# Patient Record
Sex: Female | Born: 1965 | Race: White | Hispanic: No | Marital: Married | Smoking: Former smoker
Health system: Southern US, Community
[De-identification: ages and names within clinical notes are randomized; demographics above are authoritative.]

## PROBLEM LIST (undated history)

## (undated) DIAGNOSIS — M25519 Pain in unspecified shoulder: Secondary | ICD-10-CM

## (undated) DIAGNOSIS — U071 COVID-19: Secondary | ICD-10-CM

## (undated) DIAGNOSIS — N809 Endometriosis, unspecified: Secondary | ICD-10-CM

## (undated) DIAGNOSIS — M5412 Radiculopathy, cervical region: Secondary | ICD-10-CM

## (undated) DIAGNOSIS — G43909 Migraine, unspecified, not intractable, without status migrainosus: Secondary | ICD-10-CM

## (undated) DIAGNOSIS — F419 Anxiety disorder, unspecified: Secondary | ICD-10-CM

## (undated) DIAGNOSIS — D649 Anemia, unspecified: Secondary | ICD-10-CM

## (undated) DIAGNOSIS — K589 Irritable bowel syndrome without diarrhea: Secondary | ICD-10-CM

## (undated) DIAGNOSIS — G629 Polyneuropathy, unspecified: Secondary | ICD-10-CM

## (undated) DIAGNOSIS — Z90710 Acquired absence of both cervix and uterus: Secondary | ICD-10-CM

## (undated) HISTORY — DX: Endometriosis, unspecified: N80.9

## (undated) HISTORY — DX: Pain in unspecified shoulder: M25.519

## (undated) HISTORY — DX: Migraine, unspecified, not intractable, without status migrainosus: G43.909

## (undated) HISTORY — PX: CHOLECYSTECTOMY: SHX55

## (undated) HISTORY — PX: NASAL SEPTUM SURGERY: SHX37

## (undated) HISTORY — PX: DIAGNOSTIC LAPAROSCOPY: SUR761

## (undated) HISTORY — DX: COVID-19: U07.1

## (undated) HISTORY — DX: Polyneuropathy, unspecified: G62.9

## (undated) HISTORY — PX: ABDOMINAL HYSTERECTOMY: SHX81

## (undated) HISTORY — DX: Anemia, unspecified: D64.9

## (undated) HISTORY — DX: Radiculopathy, cervical region: M54.12

---

## 1998-10-06 ENCOUNTER — Inpatient Hospital Stay (HOSPITAL_COMMUNITY): Admission: AD | Admit: 1998-10-06 | Discharge: 1998-10-08 | Payer: Self-pay | Admitting: Obstetrics and Gynecology

## 1998-10-11 ENCOUNTER — Encounter (HOSPITAL_COMMUNITY): Admission: RE | Admit: 1998-10-11 | Discharge: 1999-01-09 | Payer: Self-pay | Admitting: *Deleted

## 1998-11-19 ENCOUNTER — Other Ambulatory Visit: Admission: RE | Admit: 1998-11-19 | Discharge: 1998-11-19 | Payer: Self-pay | Admitting: Obstetrics and Gynecology

## 1999-01-10 ENCOUNTER — Encounter (HOSPITAL_COMMUNITY): Admission: RE | Admit: 1999-01-10 | Discharge: 1999-04-10 | Payer: Self-pay | Admitting: Obstetrics & Gynecology

## 1999-12-23 ENCOUNTER — Other Ambulatory Visit: Admission: RE | Admit: 1999-12-23 | Discharge: 1999-12-23 | Payer: Self-pay | Admitting: Obstetrics and Gynecology

## 2001-01-03 ENCOUNTER — Other Ambulatory Visit: Admission: RE | Admit: 2001-01-03 | Discharge: 2001-01-03 | Payer: Self-pay | Admitting: Obstetrics and Gynecology

## 2002-04-04 ENCOUNTER — Other Ambulatory Visit: Admission: RE | Admit: 2002-04-04 | Discharge: 2002-04-04 | Payer: Self-pay | Admitting: Obstetrics and Gynecology

## 2003-07-30 ENCOUNTER — Other Ambulatory Visit: Admission: RE | Admit: 2003-07-30 | Discharge: 2003-07-30 | Payer: Self-pay | Admitting: Obstetrics and Gynecology

## 2003-11-16 ENCOUNTER — Ambulatory Visit (HOSPITAL_COMMUNITY): Admission: RE | Admit: 2003-11-16 | Discharge: 2003-11-16 | Payer: Self-pay | Admitting: Obstetrics and Gynecology

## 2003-11-16 ENCOUNTER — Encounter (INDEPENDENT_AMBULATORY_CARE_PROVIDER_SITE_OTHER): Payer: Self-pay | Admitting: Specialist

## 2005-02-24 ENCOUNTER — Other Ambulatory Visit: Admission: RE | Admit: 2005-02-24 | Discharge: 2005-02-24 | Payer: Self-pay | Admitting: Obstetrics and Gynecology

## 2006-05-13 ENCOUNTER — Encounter: Admission: RE | Admit: 2006-05-13 | Discharge: 2006-05-13 | Payer: Self-pay | Admitting: Family Medicine

## 2007-08-31 ENCOUNTER — Encounter (INDEPENDENT_AMBULATORY_CARE_PROVIDER_SITE_OTHER): Payer: Self-pay | Admitting: Obstetrics and Gynecology

## 2007-08-31 ENCOUNTER — Ambulatory Visit (HOSPITAL_COMMUNITY): Admission: RE | Admit: 2007-08-31 | Discharge: 2007-09-01 | Payer: Self-pay | Admitting: Obstetrics and Gynecology

## 2009-05-16 ENCOUNTER — Encounter: Admission: RE | Admit: 2009-05-16 | Discharge: 2009-05-16 | Payer: Self-pay | Admitting: Obstetrics and Gynecology

## 2010-07-30 ENCOUNTER — Other Ambulatory Visit: Payer: Self-pay | Admitting: Family Medicine

## 2010-07-30 DIAGNOSIS — M542 Cervicalgia: Secondary | ICD-10-CM

## 2010-07-31 ENCOUNTER — Other Ambulatory Visit: Payer: Self-pay | Admitting: Family Medicine

## 2010-07-31 DIAGNOSIS — M542 Cervicalgia: Secondary | ICD-10-CM

## 2010-08-01 ENCOUNTER — Ambulatory Visit
Admission: RE | Admit: 2010-08-01 | Discharge: 2010-08-01 | Disposition: A | Discharge status: Home / Self Care | Payer: BC Managed Care – PPO | Source: Ambulatory Visit | Attending: Sports Medicine | Admitting: Sports Medicine

## 2010-08-01 ENCOUNTER — Other Ambulatory Visit: Payer: Self-pay | Admitting: Family Medicine

## 2010-08-01 DIAGNOSIS — M542 Cervicalgia: Secondary | ICD-10-CM

## 2010-09-10 ENCOUNTER — Other Ambulatory Visit: Payer: Self-pay | Admitting: Neurology

## 2010-09-10 DIAGNOSIS — G43909 Migraine, unspecified, not intractable, without status migrainosus: Secondary | ICD-10-CM

## 2010-09-10 DIAGNOSIS — M79609 Pain in unspecified limb: Secondary | ICD-10-CM

## 2010-09-10 DIAGNOSIS — M5412 Radiculopathy, cervical region: Secondary | ICD-10-CM

## 2010-09-10 DIAGNOSIS — M25519 Pain in unspecified shoulder: Secondary | ICD-10-CM

## 2010-09-16 ENCOUNTER — Ambulatory Visit
Admission: RE | Admit: 2010-09-16 | Discharge: 2010-09-16 | Disposition: A | Discharge status: Home / Self Care | Payer: BC Managed Care – PPO | Source: Ambulatory Visit | Attending: Neurology | Admitting: Neurology

## 2010-09-16 DIAGNOSIS — M5412 Radiculopathy, cervical region: Secondary | ICD-10-CM

## 2010-09-16 DIAGNOSIS — M79609 Pain in unspecified limb: Secondary | ICD-10-CM

## 2010-09-16 DIAGNOSIS — G43909 Migraine, unspecified, not intractable, without status migrainosus: Secondary | ICD-10-CM

## 2010-09-16 DIAGNOSIS — M25519 Pain in unspecified shoulder: Secondary | ICD-10-CM

## 2010-09-22 ENCOUNTER — Other Ambulatory Visit: Payer: Self-pay | Admitting: Neurology

## 2010-09-22 DIAGNOSIS — M79609 Pain in unspecified limb: Secondary | ICD-10-CM

## 2010-09-29 ENCOUNTER — Ambulatory Visit
Admission: RE | Admit: 2010-09-29 | Discharge: 2010-09-29 | Disposition: A | Discharge status: Home / Self Care | Payer: BC Managed Care – PPO | Source: Ambulatory Visit | Attending: Neurology | Admitting: Neurology

## 2010-09-29 DIAGNOSIS — M79609 Pain in unspecified limb: Secondary | ICD-10-CM

## 2010-11-11 NOTE — Discharge Summary (Signed)
[name], BURTT NO.:  [id number]   MEDICAL RECORD NO.:  [id number]          PATIENT TYPE:  OIB   LOCATION:  9316                          FACILITY:  [facility]   PHYSICIAN:  Juluis Mire, M.D.   DATE OF BIRTH:  July 12, 1965   DATE OF ADMISSION:  08/31/2007  DATE OF DISCHARGE:  09/01/2007                               DISCHARGE SUMMARY   ADMITTING DIAGNOSIS:  Pelvic pain secondary to uterine adenomyosis.   DISCHARGE DIAGNOSIS:  Pelvic pain secondary to uterine adenomyosis.   OPERATIVE PROCEDURE:  Laparoscopic-assisted vaginal hysterectomy.   For complete history and physical, please see dictated note.   COURSE IN THE HOSPITAL:  The patient underwent the above-noted surgery.  Pathology is still pending.  Postop did well.  Was discharged home on  her first postop day.  At that time she was afebrile.  She was  tolerating oral intake.  Her Foley had been discontinued.  She was  voiding without difficulty.  She had minimal vaginal spotting.  All  incisions were intact.  Her abdomen was soft and bowel sounds were  active.  Postop hemoglobin was 12.   In terms of complication, none were encountered during her stay in the  hospital.  The patient is discharged home in stable condition.   DISPOSITION:  Routine postop instructions were given.  She is to avoid  heavy lifting, vaginal entrance or driving a car.  She is to watch for  signs of infection, nausea, vomiting, increasing abdominal pain or  active vaginal bleeding.  Also instructed in signs and symptoms of deep  venous thrombosis and pulmonary embolus.  She is discharged home on  Tylox as needed for pain.   FOLLOWUP:  Plan followup in 1 week in the office.      Juluis Mire, M.D.  Electronically Signed     JSM/MEDQ  D:  09/01/2007  T:  09/01/2007  Job:  [id number]

## 2010-11-11 NOTE — H&P (Signed)
NAME:  [name], [name] NO.:  [id number]   MEDICAL RECORD NO.:  [id number]         PATIENT TYPE:  WOIB   LOCATION:                                [facility]   PHYSICIAN:  Juluis Mire, M.D.   DATE OF BIRTH:  1965-11-07   DATE OF ADMISSION:  08/31/2007  DATE OF DISCHARGE:  09/01/2007                              HISTORY & PHYSICAL   The patient is a 45 year old gravida III, para 2, abortus 1 female who  presents for laparoscopic-assisted vaginal hysterectomy.   __________ present admission.  Back in 2005, the patient underwent  laparoscopic bilateral tubal ligation, hysteroscopy and NovaSure  ablation.  She had been doing extremely well.  She was basically  amenorrheic, but she began over the past 6 months having discomfort 2  weeks of every month.  This was a severe pain that would radiate down  the legs and back.  Over-the-counter agents were not helping.  We did a  saline infusion ultrasound.  There is minimal endometrium.  She have a 2  cm cyst but it looked to be hemorrhagic in nature.  We felt that this  probably adenomyosis leading to this symptomatology.  After discussing  the options including the use of suppressive agents such as Depo-Provera  birth control pills, the patient decided to proceed with laparoscopic-  assisted vaginal hysterectomy.   ALLERGIES:  NO KNOWN DRUG ALLERGIES.   MEDICATIONS:  Vitamins.   PAST MEDICAL HISTORY:  Usual childhood diseases.  No significant  sequelae.   PAST SURGICAL HISTORY:  In 1989, she had an ovarian cystectomy with the  finding of a simple right ovarian cyst.  She had a repeat laparoscopy in  1989 with lysis of adhesions.  In 1972, she had a laparoscopy with the  finding of endometriosis.  She had another laparoscopy in 1994 with  recurrent endometriosis.  And then she had the above-noted surgery.   OBSTETRICAL HISTORY:  She has had one abortion and two vaginal  deliveries.   FAMILY HISTORY:   Noncontributory.   SOCIAL HISTORY:  No tobacco or alcohol use.   REVIEW OF SYSTEMS:  Noncontributory.   PHYSICAL EXAMINATION:  VITAL SIGNS:  The patient is afebrile with stable  vital signs.  HEENT:  The patient is normocephalic.  Pupils equal, round, reactive to  light and accommodation.  Extraocular movements were intact.  Sclerae  and conjunctivae clear.  Oropharynx clear.  NECK:  Without thyromegaly.  BREASTS:  No discrete masses.  LUNGS:  Clear.  CARDIOVASCULAR:  Regular rate.  No murmurs or gallops.  ABDOMEN:  Benign.  No mass, organomegaly or tenderness.  PELVIC:  Normal external genitalia.  Vaginal mucosa is clear.  Cervix  unremarkable.  Uterus upper limits of normal size.  Adnexa unremarkable.  EXTREMITIES:  Trace edema.  NEUROLOGIC:  Grossly within normal limits.   IMPRESSION:  Increasing pelvic pain and discomfort, possibly secondary  to adenomyosis, unresponsive to conservative management.   PLAN:  The patient to undergo laparoscopic-assisted vaginal  hysterectomy.  Again, alternatives were discussed, the risks of surgery  explained including the risk of  infection.  The risk of hemorrhage could  require transfusion with the risk of AIDS or hepatitis.  Risk of injury  to adjacent organs including bladder, bowel or ureters that could  require further exploratory surgery.  Risk of deep venous thrombosis and  pulmonary embolus.  The patient expressed understanding of indications,  risks and other alternatives.      Juluis Mire, M.D.  Electronically Signed     JSM/MEDQ  D:  08/31/2007  T:  08/31/2007  Job:  [id number]

## 2010-11-11 NOTE — Op Note (Signed)
NAMELADONNA, [name] NO.:  [id number]   MEDICAL RECORD NO.:  [id number]          PATIENT TYPE:  OIB   LOCATION:  9316                          [facility]ACILITY:  [facility]   PHYSICIAN:  Juluis Mire, M.D.   DATE OF BIRTH:  1965-12-14   DATE OF PROCEDURE:  08/31/2007  DATE OF DISCHARGE:                               OPERATIVE REPORT   PREOPERATIVE DIAGNOSIS:  Pelvic pain secondary to adenomyosis.   POSTOPERATIVE DIAGNOSIS:  Pelvic pain secondary to adenomyosis.   OPERATIVE PROCEDURE:  Laparoscopic assisted vaginal hysterectomy.   SURGEON:  Juluis Mire, M.D.   ANESTHESIA:  General endotracheal anesthesia.   ESTIMATED BLOOD LOSS:  300-400 mL.   PACKS AND DRAINS:  None.   INTRAOPERATIVE BLOOD REPLACEMENT:  None.   COMPLICATIONS:  None.   INDICATIONS:  Are as dictated in the history and physical.   PROCEDURE:  The patient was taken to the OR and placed in a supine  position.  After a satisfactory level of general endotracheal anesthesia  was obtained, the patient was placed in the dorsal lithotomy position  using Allen stirrups.  The abdomen, perineum, and vagina were prepped  out with Betadine.  The bladder was emptied by in-and-out  catheterization.  A Hulka tenaculum was put in place and secured.  The  patient was draped in a sterile field.  A subumbilical incision was made  with the knife and carried through the subcutaneous tissue.  The fascia  was identified and entered sharply.  The muscles were separated and the  peritoneum was entered sharply.  Digital exploration revealed no  adhesions in the periumbilical area and open laparoscopic trocar was put  in place and secured.  The laparoscope was introduced.  A 5 mm trocar  was put in place in the suprapubic area under direct visualization.  Visualization of the uterus revealed it to be upper limits of normal  size.  The tubes and ovaries were unremarkable.  The appendix was  visualized and noted to be normal.   The gallbladder was surgically  absent.  Both lateral gutters were clear.  Using the gyrus bipolar, the  right utero-ovarian pedicle was cauterized and incised and the right  round ligament was cauterized and incised.  We then, using the gyrus,  cauterized and incised the left utero-ovarian pedicle and left round  ligament.  We thus freed the uterus up from the adnexa.   The abdomen was desufflated of its carbon dioxide.  All trocars were  removed.  The patient's legs were repositioned.  The Hulka tenaculum was  then taken out.  A weighted speculum was placed in the vaginal vault.  The cervix was grasped with a Christella Hartigan tenaculum.  The cul-de-sac was  entered sharply.  Both uterosacral ligaments were clamped, cut and  suture ligated with 0 Vicryl.  The reflection of the vaginal mucosa  anteriorly was incised and the bladder was dissected superiorly.  The  paracervical tissue was clamped, cut, and suture ligated with 0 Vicryl.  The vesicouterine space was identified, entered sharply, and retractors  put in place. Using the clamp, cut  and tie technique with suture  ligature of 0 Vicryl, the parametrium was serially separated from the  sides of the uterus.  The uterus was then flipped, remaining pedicles  were clamped and cut.  The cervix and uterus were passed off the  operative field and sent to pathology.  The pedicles were secured with  free ties of 0 Vicryl.  A uterosacral plication stitch of 0 Vicryl was  put in place.  The vaginal mucosa was closed in a vertical fashion with  interrupted figure-of-eights of 0 Monocryl.  The Foley was placed to  straight drain with retrieval of an adequate amount of clear urine.   The patient's legs were repositioned, the laparoscope was reintroduced.  We irrigated the pelvis.  Both ovaries were hemostatically intact.  There was some oozing from the vaginal cuff that was brought under  control using the gyrus. We then reinflated the abdomen and no  further  bleeding was encountered.  There was no evidence of injury to adjacent  organs.  The abdomen was desufflated of carbon dioxide and all trocars  removed.  The subumbilical fascia was closed with two figure-of-eights  of 0 Vicryl and the skin with subcuticular 4-0 Vicryl.  The suprapubic  incision was closed with Dermabond.  Sponge, instrument, and needle  counts were reported as correct by the circulating nurse x2.  The  patient was taken out of the dorsal lithotomy position, once alert and  extubated, was transferred to the recovery room good condition.      Juluis Mire, M.D.  Electronically Signed     JSM/MEDQ  D:  08/31/2007  T:  08/31/2007  Job:  [id number]

## 2010-11-14 NOTE — Op Note (Signed)
NAME:  [name], [name]                        ACCOUNT NO.:  [id number]   MEDICAL RECORD NO.:  [id number]                   PATIENT TYPE:  AMB   LOCATION:  SDC                                  [facility]ACILITY:  [facility]   PHYSICIAN:  Juluis Mire, M.D.                DATE OF BIRTH:  [birth year]   DATE OF PROCEDURE:  11/16/2003  DATE OF DISCHARGE:                                 OPERATIVE REPORT   PREOPERATIVE DIAGNOSES:  Multiparity desires sterility.  Menorrhagia.   POSTOPERATIVE DIAGNOSES:  Multiparity desires sterility.  Menorrhagia.   PROCEDURE:  Open laparoscopy, bilateral tubal fulguration, cervical  dilation.  Hysteroscopy, intrauterine curettage. Endometrial ablation using  the Novasure.   SURGEON:  Juluis Mire, M.D.   ANESTHESIA:  General endotracheal.   ESTIMATED BLOOD LOSS:  Minimal.   PACKS AND DRAINS:  None.   INTRAOPERATIVE BLOOD REPLACED:  None.   COMPLICATIONS:  None.   INDICATIONS FOR PROCEDURE:  As dictated in the history and physical.   DESCRIPTION OF PROCEDURE:  The patient was taken to the OR, placed in the  supine position. After a satisfactory level of general endotracheal  anesthesia was obtained, the patient was placed in the dorsal lithotomy  position using the Allen stirrups. The abdomen, perineum and vagina were  prepped out with Betadine. The bladder was __________ catheterization. A  Hulka tenaculum was then put in place and secured.  The patient was draped  as a sterile field.  A subumbilical incision was made with a knife and  carried through the subcutaneous tissue. The fascia was identified, entered  sharply, incision in the fascia extended laterally. The perineum was entered  with digital pressure. There was no periumbilical adhesions.  Open  laparoscopic trocar was put in place.  The abdomen was insufflated with  carbon dioxide. The laparoscope was introduced. There is no evidence of  injury to adjacent organs. The appendix appeared to be  retrocecal, upper  abdomen between the liver and the tip of the gallbladder were clear.  The  uterus was upper limits of normal size, tubes and ovaries were unremarkable.  No active endometriosis was noted. Using the bipolar, a 2 cm to 3 cm segment  of each tube was coagulated until resistant read zero on the meter. The same  segment of tube was then recoagulated completely desiccating each tube.  Coagulation did extend out the mesosalpinx. Both tubes were adequately  coagulated. The abdomen was deflated of carbon dioxide, laparoscope and  trocar was removed. The fascia was closed with two figure-of-eights of #0  Vicryl, skin with interrupted subcuticular of 4-0 Vicryl.   The patient's legs were repositioned.  The Hulka tenaculum was then removed.  A speculum was placed in the vaginal vault, the cervix grasped with single  tooth tenaculum.  The uterus sounded to approximately 9 cm.  Endocervical  length was approximately 4 cm. The cervix was serially dilated. The  nonoperative hysteroscope was then introduced, intrauterine cavity distended  using sorbitol. Visualization revealed a clean endometrium, no polyps or  other abnormalities were noted. Endometrial curettings were obtained and  sent for pathological review. At this point in time, the Novasure was  inserted.  It was properly positioned. CO2 test was negative. We  subsequently proceed with the ablation which lasted 1 minute and 10 seconds  to a power of 113.  The cavity depth was measured at 5 cm, the cavity width  at 4.1 cm.  After the ablation was done, the Novasure was removed. There was  some bleeding from the cervix at the area of tack and brought under control  with a figure-of-eight of #0 chromic. The speculum was then removed and the  patient taken out of the dorsal lithotomy position and once alert and  extubated transferred to the recovery room in good condition. Sponge,  instrument and needle count correct by circulating  nurse x2.                                               Juluis Mire, M.D.    JSM/MEDQ  D:  11/16/2003  T:  11/16/2003  Job:  [id number]

## 2010-11-14 NOTE — H&P (Signed)
NAME:  [name], [name]                        ACCOUNT NO.:  [id number]   MEDICAL RECORD NO.:  [id number]                   PATIENT TYPE:  AMB   LOCATION:  SDC                                  [facility]   PHYSICIAN:  Juluis Mire, M.D.                DATE OF BIRTH:  1965/07/10   DATE OF ADMISSION:  11/16/2003  DATE OF DISCHARGE:                                HISTORY & PHYSICAL   CHIEF COMPLAINT:  The patient is a 45 year old gravida 3 para 2 abortus 1  married white female who presents for laparoscopic bilateral tubal ligation,  hysteroscopy, and subsequent NovaSure ablation.   PRESENT ADMISSION:  The patient presently using condoms for birth control  purposes.  Cycles have been relatively regular.  She is experiencing a  significant increase in flow.  She reports 6 days of total flow, 5 days  being heavy where she changes pads and tampons every 2 hours with associated  clots.  She has gotten worse since she has discontinued birth control pills.  She is having some mid cycle pain and some increasing dysmenorrhea.  She  does have a past history of pelvic endometriosis that had been treated with  laparoscopic laser ablation.  She has also had some uterine enlargement  thought to be secondary to uterine adenomyosis.  She is sexually active with  no discomfort.  We had discussed the various options including going back on  birth control pills versus the above-noted procedure versus more aggressive  management with hysterectomy.  At the present time our main indication is  NovaSure ablation.  Because of concerns with future pregnancies we are going  to proceed with a laparoscopic bilateral tubal fulguration.  The potential  irreversibility of sterilization is explained.  A failure rate of 1 in 200  is quoted.  Failures can be in the form of ectopic pregnancy requiring  further surgical options.  She understands this and wishes to proceed with  tubal.  We will also do a  hysteroscopic evaluation and sampling of the  endometrium prior to the ablation.   ALLERGIES:  No known drug allergies.   MEDICATIONS:  Vitamins.   PAST MEDICAL HISTORY:  Usual childhood diseases, no significant sequelae.   PAST SURGICAL HISTORY:  She had an ovarian cystectomy in 1989 with the  finding of a simple right ovarian cyst.  Subsequently, due to pain she had a  repeat laparoscopy in 1989 with some lysis of adhesions.  Because of further  pain issues she had a repeat laparoscopy done in 1992.  We did find some  recurrent endometriosis at that time that was treated.  Again, because of  some recurrent pain issues she underwent a laparoscopy in 1994 and did have  some endometriosis that was treated at that time.   OBSTETRICAL HISTORY:  She has had one abortion and two vaginal deliveries.   FAMILY HISTORY:  Noncontributory.  SOCIAL HISTORY:  No tobacco or alcohol use.   REVIEW OF SYSTEMS:  Noncontributory.   PHYSICAL EXAMINATION:  VITAL SIGNS:  The patient is afebrile with stable  vital signs.  HEENT:  The patient is normocephalic.  Pupils equal, round, and reactive to  light and accommodation.  Extraocular movements were intact.  Sclerae and  conjunctivae were clear, oropharynx clear.  NECK:  Without thyromegaly.  BREASTS:  No discrete masses.  LUNGS:  Clear.  CARDIOVASCULAR:  Regular rhythm and rate without murmurs or gallops.  ABDOMEN:  Benign.  There are no masses, organomegaly, or tenderness.  PELVIC:  Normal external genitalia, vaginal mucosa is clear.  Cervix  unremarkable.  Uterus normal size, shape, and contour.  Adnexa free of  masses or tenderness.  EXTREMITIES:  Trace edema.  NEUROLOGIC:  Grossly within normal limits.   BASIC IMPRESSION:  1. Menorrhagia.  2. History of pelvic endometriosis.  3. Previous right ovarian cystectomy.  4. Multiparity, desires sterility.   PLAN:  The patient to undergo laparoscopic bilateral tubal fulguration  followed by  hysteroscopy and NovaSure ablation.  Success rates for ablation  of 80-90% have been discussed.  The risks of surgery explained including the  risk of infection; the risk of vascular injury that could lead to hemorrhage  requiring transfusion or possible hysterectomy; the risk of injury to  adjacent organs including bladder, bowel, or ureters that could require  further exploratory surgery; the risk of deep venous thrombosis and  pulmonary embolus.  The patient professed an understanding of the  indications and risks.                                               Juluis Mire, M.D.    JSM/MEDQ  D:  11/16/2003  T:  11/16/2003  Job:  [id number]

## 2011-01-14 ENCOUNTER — Ambulatory Visit
Admission: RE | Admit: 2011-01-14 | Discharge: 2011-01-14 | Disposition: A | Discharge status: Home / Self Care | Payer: BC Managed Care – PPO | Source: Ambulatory Visit | Attending: Family Medicine | Admitting: Family Medicine

## 2011-01-14 ENCOUNTER — Other Ambulatory Visit: Payer: Self-pay | Admitting: Family Medicine

## 2011-01-14 DIAGNOSIS — J189 Pneumonia, unspecified organism: Secondary | ICD-10-CM

## 2011-03-23 LAB — CBC
HCT: 34.1 — ABNORMAL LOW
HCT: 41.5
Hemoglobin: 12
Hemoglobin: 14.5
MCHC: 35
MCHC: 35.1
MCV: 94.7
MCV: 96
Platelets: 180
Platelets: 244
RBC: 3.55 — ABNORMAL LOW
RBC: 4.38
RDW: 12.4
RDW: 12.4
WBC: 3.3 — ABNORMAL LOW
WBC: 7.4

## 2011-03-23 LAB — HCG, SERUM, QUALITATIVE: Preg, Serum: NEGATIVE

## 2011-05-04 ENCOUNTER — Ambulatory Visit: Payer: BC Managed Care – PPO | Admitting: Family Medicine

## 2011-05-06 ENCOUNTER — Encounter: Payer: Self-pay | Admitting: Family Medicine

## 2011-05-06 ENCOUNTER — Ambulatory Visit
Admission: RE | Admit: 2011-05-06 | Discharge: 2011-05-06 | Disposition: A | Discharge status: Home / Self Care | Payer: BC Managed Care – PPO | Source: Ambulatory Visit | Attending: Sports Medicine | Admitting: Sports Medicine

## 2011-05-06 ENCOUNTER — Ambulatory Visit (INDEPENDENT_AMBULATORY_CARE_PROVIDER_SITE_OTHER): Payer: BC Managed Care – PPO | Admitting: Family Medicine

## 2011-05-06 VITALS — BP 123/77 | HR 85 | Ht 69.0 in | Wt 143.0 lb

## 2011-05-06 DIAGNOSIS — M79671 Pain in right foot: Secondary | ICD-10-CM

## 2011-05-06 DIAGNOSIS — M79609 Pain in unspecified limb: Secondary | ICD-10-CM

## 2011-05-06 NOTE — Assessment & Plan Note (Signed)
DDX is metatarsal stress fracture vs metatarsalgia.  No clear sign of Fx on [place] today. Plan for Xray as injury is now 33-46 weeks old.  Will follow Xray results. Have placed patient in post op shoe today and made orthotics/sports insoles with metatarsal pad.  Will follow up depending on Dx.

## 2011-05-06 NOTE — Progress Notes (Signed)
Right foot pain: Starting 2 months ago following a jumping aerobics type workout. She had BL pain along her MTPs following the workout. She tried to rest her feet and took two weeks off from exercise. Her left foot pain resolved. However her right foot pain has persisted. Currently she has pain at the 3rd MTP distal head with walking or any jumping. No numbness in her toes. She notes chronic nagging pain. No fevers or chills.   Exam:  BP 123/77  Pulse 85  Ht 5\' 9"  (1.753 m)  Wt 143 lb (64.864 kg)  BMI 21.12 kg/m2 Gen: Well NAD MSK: Foot toes splay when standing. Normal longitudinal arch. Toes are NVI. 3rd MTP head is TTP.  Pt has pain in 3rd MTP when compressing the transverse arch.  Normal motion of the toes.   [place]: Foot US shows some neo-vessle formation along the dorsal distal 3rd metatarsal. No clear cortex disruption.

## 2011-05-06 NOTE — Patient Instructions (Signed)
Thank you for coming in today. We will call you with the results of your xray tomorrow.  Take it easy today and this week.  Cycling and swimming are OK.  Wear the post op shoe.  We will discuss return to clinic over the phone with the xray results.

## 2011-05-07 ENCOUNTER — Telehealth: Payer: Self-pay | Admitting: Family Medicine

## 2011-05-07 NOTE — Telephone Encounter (Signed)
Discussed results of her xray.  There was no sign of a stress fracture on her film.  She still has pain.  We will keep her in her boot for at least 2 weeks.  After 2 weeks if she doesn't have pain she can go back into her normal shoe.  If the pain continues she will keep the boot until her follow up with [place] in one month.

## 2011-06-15 ENCOUNTER — Ambulatory Visit (INDEPENDENT_AMBULATORY_CARE_PROVIDER_SITE_OTHER): Payer: BC Managed Care – PPO | Admitting: Sports Medicine

## 2011-06-15 VITALS — BP 110/60

## 2011-06-15 DIAGNOSIS — M79671 Pain in right foot: Secondary | ICD-10-CM

## 2011-06-15 DIAGNOSIS — M79609 Pain in unspecified limb: Secondary | ICD-10-CM

## 2011-06-15 NOTE — Progress Notes (Signed)
  Subjective:    Patient ID: [name], female    DOB: [birth year], 45 y.o.   MRN: [id number]  HPI 45 y/o female is here to follow up for right foot pain that was thought to be secondary to [employer]. She had a stress reaction seen on ultrasound at her last visit so was placed in a hard sole shoe.  She wore the post op shoe for 2 weeks and then started wearing the green insoles with metatarsal pad in her everyday shoes.  She continues to have pain.  She has most of the pain at the end of the day.  She gets pain and burning around the MTP joints of the 2nd-4th toes.  She never gets numbness or burning of the toes.  There is no more pain in the foot.  Also on the right side, she has sciatica which is intermittently symptomatic.  She is not currently symptomatic.  When she gets a flare she generally treats this with gabapentin at night.   Review of Systems     Objective:   Physical Exam  No tenderness to palpation over the metatarsals  Tenderness to palpation of the interdigital spaces on the plantar surface Mild swelling of the forefoot Normal motion of the ankle and toes Normal sensation of the toes Loss of the transverse arch with subluxation of the 5th toes Normal posterior tibialis function  Negative straight leg raise ankle extended Does get calf pain with flexion of the foot FABER is neutral on the left, 70 degrees on the right with some pain  Hip flexion to 100 degrees bilaterally without pain        Assessment & Plan:

## 2011-06-15 NOTE — Patient Instructions (Addendum)
1. Your foot pain is from bruising of the nerves in your foot.  You should start taking your gabapentin again to help with the pain.  2. You should start wearing your orthotics in your shoes daily.  3. Follow up in 6 weeks

## 2011-06-15 NOTE — Assessment & Plan Note (Signed)
DDx for this pain is metatarsalgia, morton's neuroma, and injury to the neurovascular bundle at the interdigital spaces.  The treatment for all is similar involving support of the transverse arch.  We added a metatarsal bar to achieve this.  As her pain is c/w nerve pain she will start taking her gabapentin again at night.  Suspect that her history of sciatica would make her distal nerves more sensitive in general so the gabapentin should be helpful for her pain.

## 2011-07-07 ENCOUNTER — Encounter: Payer: Self-pay | Admitting: General Practice

## 2011-08-05 ENCOUNTER — Ambulatory Visit (INDEPENDENT_AMBULATORY_CARE_PROVIDER_SITE_OTHER): Payer: BC Managed Care – PPO | Admitting: Internal Medicine

## 2011-08-05 ENCOUNTER — Encounter: Payer: Self-pay | Admitting: Internal Medicine

## 2011-08-05 VITALS — BP 108/68 | HR 78 | Ht 69.0 in | Wt 149.8 lb

## 2011-08-05 DIAGNOSIS — R1033 Periumbilical pain: Secondary | ICD-10-CM

## 2011-08-05 DIAGNOSIS — R109 Unspecified abdominal pain: Secondary | ICD-10-CM

## 2011-08-05 MED ORDER — ALPRAZOLAM 0.5 MG PO TABS: ORAL_TABLET | ORAL | Status: DC

## 2011-08-05 MED ORDER — HYOSCYAMINE SULFATE 0.125 MG SL SUBL: SUBLINGUAL_TABLET | SUBLINGUAL | Status: DC

## 2011-08-05 MED ORDER — BELLADONNA ALK-PHENOBARBITAL 48 MG PO TBCR: EXTENDED_RELEASE_TABLET | ORAL | Status: DC

## 2011-08-05 MED ORDER — ALIGN 4 MG PO CAPS: 1.0000 | ORAL_CAPSULE | ORAL | Status: DC

## 2011-08-05 NOTE — Patient Instructions (Addendum)
You have been scheduled for an upper gi series with small bowel follow thru at [facility] on 08/10/11 @ 9:30 am. Please arrive at 9:15 am for registration. Please make certain not to have anything to eat or drink after midnight on the night before your test. If this appointment time and date does not work well for you, you may call radiology directly at [phone]. We have given you samples of Align. This puts good bacteria back into your intestines. You should take 1 capsule by mouth once daily. If this works well for you, it can be purchased over the counter. We have sent the following medications to your pharmacy for you to pick up at your convenience: Donnatal Levsin Xanax CC: Dr Ancil Boozer, Dr Arelia Sneddon

## 2011-08-05 NOTE — Progress Notes (Signed)
MYSTIC LABO 1966-05-29 MRN [id number]   History of Present Illness:  This is a 46 year old female with severe crampy abdominal pain and gas which occurs frequently and may be very severe to the point where she has to get on her knees. She has tried an elimitation diet without success. She has seen Dr. Kinnie Scales and was tried on Amitiza 8 mcg twice a day. She has been on Donnatal one by mouth twice a day for the past 2 years without improvement. She tends to have constipation for which she takes magnesium tablets. She used to take Xanax 0.5 mg when necessary for stress. She has migraine headaches for which she takes Topamax 150 mg/day. She also has neuropathy for which she takes Neurontin 300 mg at bedtime. She denies rectal bleeding, family history of Crohn's disease or ulcerative colitis. Her weight has been stable. She has a history of endometriosis necessitating a laparoscopic ablation by Dr. Arelia Sneddon.   Past Medical History  Diagnosis Date  . Migraines    Past Surgical History  Procedure Date  . Abdominal hysterectomy   . Cholecystectomy     reports that she has never smoked. She has never used smokeless tobacco. She reports that she does not drink alcohol or use illicit drugs. family history includes Colon polyps in her mother; Diabetes in her mother; Hypertension in her mother; and Thyroid disease in her father. Allergies  Allergen Reactions  . Codeine     nausea        Review of Systems: Denies dysphagia, odynophagia, chest pain shortness of breath  The remainder of the 10 point ROS is negative except as outlined in H&P   Physical Exam: General appearance  Well developed, in no distress. Eyes- non icteric. HEENT nontraumatic, normocephalic. Mouth no lesions, tongue papillated, no cheilosis. Neck supple without adenopathy, thyroid not enlarged, no carotid bruits, no JVD. Lungs Clear to auscultation bilaterally. Cor normal S1, normal S2, regular rhythm, no murmur,  quiet  precordium. Abdomen soft scaphoid with good muscular support. Normal active bowel sounds. No particular tenderness. No bruit.  Rectal: Soft Hemoccult negative stool Extremities no pedal edema. Skin no lesions. Neurological alert and oriented x 3. Psychological normal mood and affect.  Assessment and Plan:  Problem #1 Crampy abdominal pain consistent with irritable bowel syndrome. We need to rule out the possibility of active endometriosis with implants on the bowel. We also need to rule out primary small bowel disease such as Crohn's or small bowel malrotation causing partial obstruction. I have asked her to taper off her Donnatal which does not seem to be helping and start Levsin sublingual 0.125 mg when necessary for abdominal pain. If there is no relief, she will try Xanax 0.5 mg as a smooth muscle relaxant. We will add a probiotic once a day. We may also consider a colonoscopy. She is seeing Dr.McComb to evaluate for recurrent endometriosis which could be the source of some of her abdominal pain. She has a pelvic ultrasound scheduled.   08/05/2011 [name]

## 2011-08-06 ENCOUNTER — Telehealth: Payer: Self-pay | Admitting: Internal Medicine

## 2011-08-06 NOTE — Telephone Encounter (Signed)
I would switch her to regular Donnatal 1x/day for 2 weeks, then stop .

## 2011-08-06 NOTE — Telephone Encounter (Signed)
Dr Juanda Chance- The extended release donnatal tabs are no longer available. Do you want me to change patient to regular donnatal tablet? If so, do you want her to have it for more than once daily as pharmacy recommends be done with the regular tablets (I know you are trying to taper her off)?

## 2011-08-07 NOTE — Telephone Encounter (Signed)
I have spoken to [place] @ [facility] to advise that regular donnatal is okay. She would like patient to have only 1 tablet daily x 2 weeks, then discontinue. Jim verbalizes understanding.

## 2011-08-10 ENCOUNTER — Ambulatory Visit (HOSPITAL_COMMUNITY)
Admission: RE | Admit: 2011-08-10 | Discharge: 2011-08-10 | Disposition: A | Discharge status: Home / Self Care | Payer: BC Managed Care – PPO | Source: Ambulatory Visit | Attending: Internal Medicine | Admitting: Internal Medicine

## 2011-08-10 DIAGNOSIS — R109 Unspecified abdominal pain: Secondary | ICD-10-CM

## 2011-11-04 ENCOUNTER — Encounter (HOSPITAL_COMMUNITY): Payer: Self-pay | Admitting: Pharmacist

## 2011-11-11 ENCOUNTER — Encounter (HOSPITAL_COMMUNITY): Payer: Self-pay

## 2011-11-11 ENCOUNTER — Encounter (HOSPITAL_COMMUNITY)
Admission: RE | Admit: 2011-11-11 | Discharge: 2011-11-11 | Disposition: A | Discharge status: Home / Self Care | Payer: BC Managed Care – PPO | Source: Ambulatory Visit | Attending: Obstetrics and Gynecology | Admitting: Obstetrics and Gynecology

## 2011-11-11 HISTORY — DX: Anxiety disorder, unspecified: F41.9

## 2011-11-11 HISTORY — DX: Irritable bowel syndrome, unspecified: K58.9

## 2011-11-11 LAB — SURGICAL PCR SCREEN
MRSA, PCR: NEGATIVE
Staphylococcus aureus: POSITIVE — AB

## 2011-11-11 LAB — CBC
HCT: 43.1 % (ref 36.0–46.0)
Hemoglobin: 14.2 g/dL (ref 12.0–15.0)
MCH: 32.1 pg (ref 26.0–34.0)
MCHC: 32.9 g/dL (ref 30.0–36.0)
MCV: 97.3 fL (ref 78.0–100.0)
Platelets: 223 10*3/uL (ref 150–400)
RBC: 4.43 MIL/uL (ref 3.87–5.11)
RDW: 12 % (ref 11.5–15.5)
WBC: 6.4 10*3/uL (ref 4.0–10.5)

## 2011-11-11 NOTE — Patient Instructions (Addendum)
20 [name]  11/11/2011   Your procedure is scheduled on:  11/17/11  Enter through the Main Entrance of [facility] at 6 AM.  Pick up the phone at the desk and dial [phone].   Call this number if you have problems the morning of surgery: [phone]   Remember:   Do not eat food:After Midnight.  Do not drink clear liquids: After Midnight.  Take these medicines the morning of surgery with A SIP OF WATER: NA   Do not wear jewelry, make-up or nail polish.  Do not wear lotions, powders, or perfumes. You may wear deodorant.  Do not shave 48 hours prior to surgery.  Do not bring valuables to the hospital.  Contacts, dentures or bridgework may not be worn into surgery.  Leave suitcase in the car. After surgery it may be brought to your room.  For patients admitted to the hospital, checkout time is 11:00 AM the day of discharge.   Patients discharged the day of surgery will not be allowed to drive home.  Name and phone number of your driver: NA  Special Instructions: CHG Shower Use Special Wash: 1/2 bottle night before surgery and 1/2 bottle morning of surgery.   Please read over the following fact sheets that you were given: MRSA Information

## 2011-11-11 NOTE — H&P (Signed)
  Patient name   [name], [name] DICTATION#  [id number] CSN# [id number]  Juluis Mire, MD 11/11/2011 1:22 PM

## 2011-11-12 NOTE — H&P (Signed)
NAME:  [name], [name] NO.:  [id number]  MEDICAL RECORD NO.:  [id number]  LOCATION:  [facility]                           FACILITY:  [facility]  PHYSICIAN:  Juluis Mire, M.D.   DATE OF BIRTH:  18-May-1966  DATE OF ADMISSION:  11/11/2011 DATE OF DISCHARGE:                             HISTORY & PHYSICAL   The patient is a 46 year old, gravida 3, para 2 married female presents for diagnostic laparoscopy with laser standby and injection of vaginal cuff with a combination of steroids and Marcaine.  In relation to the present admission, the patient had a prior laparoscopic-assisted vaginal hysterectomy done in 2009 for pelvic pain and discomfort.  She has been doing well but have increasing pain with intercourse during deep penetration.  Her ultrasounds have been unremarkable.  She has an exquisitely tender top of the vaginal cuff. Therefore we are going to proceed with laparoscopic evaluation.  She does have a past history of endometriosis.  We will do lysis of adhesions and treat endometriosis and then we will inject the cuff with a combination of a steroid and dexamethasone.  Hopefully this will relieve her pain.  It was also noted that she has a small nodular area in the right labia majora.  It could be a hernia.  We will evaluate this laparoscopically.  ALLERGIES:  She has no known drug allergies.  MEDICATIONS:  She is on Topamax 150 mg, spironolactone 25 mg, gabapentin 300 mg, ketoprofen 75 mg and Maxalt 10 mg as needed.  PAST MEDICAL HISTORY:  She has usual childhood diseases without any significant sequelae.  Does have problems with migraine headaches for which she is on the above-noted medications.  PAST SURGICAL HISTORY:  As noted above she had a previous laparoscopic- assisted vaginal hysterectomy.  In 2005, she had laparoscopic bilateral tubal ligation, hysteroscopy, and endometrial ablation using the NovaSure.  She has had an ovarian cystectomy in 1989 with  find of a simple cyst.  Subsequently due to pain discomfort, she had laparoscopy again in 1989 with lysis of adhesions.  Because of further pain issues, she had a repeat laparoscopy in 1992.  We did find some endometriosis at that time and it was treated.  Because of continued pain, she underwent laparoscopy in 1994.  Did have some recurrent endometriosis as noted previously the other surgeries.  OBSTETRICAL HISTORY:  She has had 2 vaginal deliveries, 1 abortion.  FAMILY HISTORY:  Noncontributory.  SOCIAL HISTORY:  No tobacco or alcohol use.  REVIEW OF SYSTEMS:  Noncontributory.  PHYSICAL EXAMINATION:  VITAL SIGNS:  The patient is afebrile, stable vital signs. HEENT:  The patient is normocephalic.  Pupils equal, round, and react to light and accommodation.  Extraocular movements were intact.  Sclerae and conjunctivae were clear.  Oropharynx clear. NECK:  Without thyromegaly. BREASTS:  No discrete masses. LUNGS:  Clear. CARDIOVASCULAR SYSTEM:  Regular rhythm and rate.  There are no murmurs or gallops. ABDOMEN:  Benign.  No mass, organomegaly, or tenderness. PELVIC:  Normal external genitalia.  Vaginal mucosa is clear.  Cuff intact.  She has marked cuff tenderness.  Bimanual exam is unremarkable. EXTREMITIES:  Trace edema. NEUROLOGIC:  Grossly within normal limits.  IMPRESSION: 1. Deep dyspareunia, rule out pelvic adhesions versus endometriosis. 2. Previous laparoscopic-assisted vaginal hysterectomy.  PLAN OF MANAGEMENT:  The patient will undergo diagnostic laparoscopy, laser standby.  Also inject the vaginal cuff as noted above with dexamethasone and Marcaine.  The risks of surgery have been discussed including the risk of infection.  The risk of bleeding that could require transfusion with the risk of AIDS, or hepatitis.  Excessive bleeding could require exploratory surgery.  Risk of injury to adjacent organs such as bladder, bowel, ureters could require further  exploratory surgery.  Risk of deep venous thrombosis and pulmonary embolus.  Again we stated to her that there is no guarantee this will completely alleviate the pain, but hopefully will get some improvement if not completely alleviated.     Juluis Mire, M.D.     JSM/MEDQ  D:  11/11/2011  T:  11/12/2011  Job:  [id number]

## 2011-11-17 ENCOUNTER — Ambulatory Visit (HOSPITAL_COMMUNITY): Payer: BC Managed Care – PPO | Admitting: Anesthesiology

## 2011-11-17 ENCOUNTER — Encounter (HOSPITAL_COMMUNITY): Payer: Self-pay | Admitting: Anesthesiology

## 2011-11-17 ENCOUNTER — Encounter (HOSPITAL_COMMUNITY): Payer: Self-pay | Admitting: *Deleted

## 2011-11-17 ENCOUNTER — Encounter (HOSPITAL_COMMUNITY)
Admission: RE | Disposition: A | Discharge status: Home / Self Care | Payer: Self-pay | Source: Ambulatory Visit | Attending: Obstetrics and Gynecology

## 2011-11-17 ENCOUNTER — Ambulatory Visit (HOSPITAL_COMMUNITY)
Admission: RE | Admit: 2011-11-17 | Discharge: 2011-11-17 | Disposition: A | Discharge status: Home / Self Care | Payer: BC Managed Care – PPO | Source: Ambulatory Visit | Attending: Obstetrics and Gynecology | Admitting: Obstetrics and Gynecology

## 2011-11-17 DIAGNOSIS — Z01812 Encounter for preprocedural laboratory examination: Secondary | ICD-10-CM | POA: Insufficient documentation

## 2011-11-17 DIAGNOSIS — L905 Scar conditions and fibrosis of skin: Secondary | ICD-10-CM | POA: Insufficient documentation

## 2011-11-17 DIAGNOSIS — N949 Unspecified condition associated with female genital organs and menstrual cycle: Secondary | ICD-10-CM | POA: Insufficient documentation

## 2011-11-17 DIAGNOSIS — IMO0002 Reserved for concepts with insufficient information to code with codable children: Secondary | ICD-10-CM | POA: Insufficient documentation

## 2011-11-17 DIAGNOSIS — Z01818 Encounter for other preprocedural examination: Secondary | ICD-10-CM | POA: Insufficient documentation

## 2011-11-17 DIAGNOSIS — M79671 Pain in right foot: Secondary | ICD-10-CM

## 2011-11-17 HISTORY — PX: CYSTOSCOPY: SHX5120

## 2011-11-17 HISTORY — PX: LAPAROSCOPY: SHX197

## 2011-11-17 SURGERY — LAPAROSCOPY OPERATIVE
Anesthesia: General | Wound class: Clean Contaminated

## 2011-11-17 MED ORDER — BUPIVACAINE HCL 0.5 % IJ SOLN
INTRAMUSCULAR | Status: DC | PRN
  Administered 2011-11-17: 30 mL

## 2011-11-17 MED ORDER — NEOSTIGMINE METHYLSULFATE 1 MG/ML IJ SOLN
INTRAMUSCULAR | Status: DC | PRN
  Administered 2011-11-17: 2 mg via INTRAVENOUS

## 2011-11-17 MED ORDER — BUPIVACAINE HCL (PF) 0.5 % IJ SOLN
INTRAMUSCULAR | Status: AC
  Filled 2011-11-17: qty 30

## 2011-11-17 MED ORDER — KETOROLAC TROMETHAMINE 30 MG/ML IJ SOLN
INTRAMUSCULAR | Status: DC | PRN
  Administered 2011-11-17: 30 mg via INTRAVENOUS

## 2011-11-17 MED ORDER — DIPHENHYDRAMINE HCL 50 MG/ML IJ SOLN
25.0000 mg | Freq: Once | INTRAMUSCULAR | Status: AC
  Administered 2011-11-17: 25 mg via INTRAVENOUS

## 2011-11-17 MED ORDER — FENTANYL CITRATE 0.05 MG/ML IJ SOLN
25.0000 ug | INTRAMUSCULAR | Status: DC | PRN
  Administered 2011-11-17: 100 ug via INTRAVENOUS
  Administered 2011-11-17: 50 ug via INTRAVENOUS

## 2011-11-17 MED ORDER — GLYCOPYRROLATE 0.2 MG/ML IJ SOLN
INTRAMUSCULAR | Status: DC | PRN
  Administered 2011-11-17: 0.4 mg via INTRAVENOUS

## 2011-11-17 MED ORDER — FENTANYL CITRATE 0.05 MG/ML IJ SOLN
INTRAMUSCULAR | Status: AC
  Filled 2011-11-17: qty 2

## 2011-11-17 MED ORDER — PROPOFOL 10 MG/ML IV EMUL
INTRAVENOUS | Status: DC | PRN
  Administered 2011-11-17: 150 mg via INTRAVENOUS

## 2011-11-17 MED ORDER — LIDOCAINE HCL (CARDIAC) 20 MG/ML IV SOLN
INTRAVENOUS | Status: DC | PRN
  Administered 2011-11-17: 60 mg via INTRAVENOUS

## 2011-11-17 MED ORDER — BUPIVACAINE HCL (PF) 0.25 % IJ SOLN
INTRAMUSCULAR | Status: AC
  Filled 2011-11-17: qty 30

## 2011-11-17 MED ORDER — KETOROLAC TROMETHAMINE 30 MG/ML IJ SOLN
INTRAMUSCULAR | Status: AC
  Filled 2011-11-17: qty 1

## 2011-11-17 MED ORDER — ROCURONIUM BROMIDE 50 MG/5ML IV SOLN
INTRAVENOUS | Status: AC
  Filled 2011-11-17: qty 1

## 2011-11-17 MED ORDER — BUPIVACAINE HCL (PF) 0.25 % IJ SOLN
INTRAMUSCULAR | Status: DC | PRN
  Administered 2011-11-17: 30 mL

## 2011-11-17 MED ORDER — HYDROMORPHONE HCL 2 MG PO TABS
ORAL_TABLET | ORAL | Status: AC
  Administered 2011-11-17: 2 mg via ORAL
  Filled 2011-11-17: qty 1

## 2011-11-17 MED ORDER — LIDOCAINE HCL (CARDIAC) 20 MG/ML IV SOLN
INTRAVENOUS | Status: AC
  Filled 2011-11-17: qty 5

## 2011-11-17 MED ORDER — ONDANSETRON HCL 4 MG/2ML IJ SOLN
INTRAMUSCULAR | Status: DC | PRN
  Administered 2011-11-17: 4 mg via INTRAVENOUS

## 2011-11-17 MED ORDER — MIDAZOLAM HCL 2 MG/2ML IJ SOLN
INTRAMUSCULAR | Status: AC
  Filled 2011-11-17: qty 2

## 2011-11-17 MED ORDER — INDIGOTINDISULFONATE SODIUM 8 MG/ML IJ SOLN
INTRAMUSCULAR | Status: AC
  Filled 2011-11-17: qty 5

## 2011-11-17 MED ORDER — NEOSTIGMINE METHYLSULFATE 1 MG/ML IJ SOLN
INTRAMUSCULAR | Status: AC
  Filled 2011-11-17: qty 10

## 2011-11-17 MED ORDER — FENTANYL CITRATE 0.05 MG/ML IJ SOLN
INTRAMUSCULAR | Status: DC | PRN
  Administered 2011-11-17: 50 ug via INTRAVENOUS
  Administered 2011-11-17 (×2): 100 ug via INTRAVENOUS

## 2011-11-17 MED ORDER — DIPHENHYDRAMINE HCL 50 MG/ML IJ SOLN
INTRAMUSCULAR | Status: AC
  Administered 2011-11-17: 25 mg via INTRAVENOUS
  Filled 2011-11-17: qty 1

## 2011-11-17 MED ORDER — DEXAMETHASONE SODIUM PHOSPHATE 10 MG/ML IJ SOLN
INTRAMUSCULAR | Status: AC
  Filled 2011-11-17: qty 1

## 2011-11-17 MED ORDER — DEXAMETHASONE SODIUM PHOSPHATE 10 MG/ML IJ SOLN
INTRAMUSCULAR | Status: DC | PRN
  Administered 2011-11-17: 10 mg via INTRAVENOUS

## 2011-11-17 MED ORDER — PROPOFOL 10 MG/ML IV EMUL
INTRAVENOUS | Status: AC
  Filled 2011-11-17: qty 20

## 2011-11-17 MED ORDER — GLYCOPYRROLATE 0.2 MG/ML IJ SOLN
INTRAMUSCULAR | Status: AC
  Filled 2011-11-17: qty 2

## 2011-11-17 MED ORDER — HYDROMORPHONE HCL 2 MG PO TABS
2.0000 mg | ORAL_TABLET | Freq: Once | ORAL | Status: AC
  Administered 2011-11-17: 2 mg via ORAL

## 2011-11-17 MED ORDER — ROCURONIUM BROMIDE 100 MG/10ML IV SOLN
INTRAVENOUS | Status: DC | PRN
  Administered 2011-11-17: 30 mg via INTRAVENOUS

## 2011-11-17 MED ORDER — LACTATED RINGERS IV SOLN
INTRAVENOUS | Status: DC
  Administered 2011-11-17 (×4): via INTRAVENOUS

## 2011-11-17 MED ORDER — STERILE WATER FOR IRRIGATION IR SOLN
Status: DC | PRN
  Administered 2011-11-17: 3000 mL

## 2011-11-17 MED ORDER — LACTATED RINGERS IV SOLN: INTRAVENOUS | Status: DC

## 2011-11-17 MED ORDER — DEXAMETHASONE SODIUM PHOSPHATE 10 MG/ML IJ SOLN
INTRAMUSCULAR | Status: AC
  Filled 2011-11-17: qty 5

## 2011-11-17 MED ORDER — INDIGOTINDISULFONATE SODIUM 8 MG/ML IJ SOLN
INTRAMUSCULAR | Status: DC | PRN
  Administered 2011-11-17: 5 mL via INTRAVENOUS

## 2011-11-17 MED ORDER — ONDANSETRON HCL 4 MG/2ML IJ SOLN
INTRAMUSCULAR | Status: AC
  Filled 2011-11-17: qty 2

## 2011-11-17 MED ORDER — FENTANYL CITRATE 0.05 MG/ML IJ SOLN
INTRAMUSCULAR | Status: AC
  Administered 2011-11-17: 100 ug via INTRAVENOUS
  Filled 2011-11-17: qty 2

## 2011-11-17 MED ORDER — MIDAZOLAM HCL 5 MG/5ML IJ SOLN
INTRAMUSCULAR | Status: DC | PRN
  Administered 2011-11-17: 2 mg via INTRAVENOUS

## 2011-11-17 MED ORDER — FENTANYL CITRATE 0.05 MG/ML IJ SOLN
INTRAMUSCULAR | Status: AC
  Filled 2011-11-17: qty 5

## 2011-11-17 SURGICAL SUPPLY — 33 items
ADH SKN CLS APL DERMABOND .7 (GAUZE/BANDAGES/DRESSINGS) ×2
BAG SPEC RTRVL LRG 6X4 10 (ENDOMECHANICALS)
CABLE HIGH FREQUENCY MONO STRZ (ELECTRODE) IMPLANT
CATH ROBINSON RED A/P 16FR (CATHETERS) IMPLANT
CLOTH BEACON ORANGE TIMEOUT ST (SAFETY) ×3 IMPLANT
DERMABOND ADVANCED (GAUZE/BANDAGES/DRESSINGS) ×1
DERMABOND ADVANCED .7 DNX12 (GAUZE/BANDAGES/DRESSINGS) ×2 IMPLANT
ELECT REM PT RETURN 9FT ADLT (ELECTROSURGICAL) ×3
ELECTRODE REM PT RTRN 9FT ADLT (ELECTROSURGICAL) ×2 IMPLANT
GLOVE BIO SURGEON STRL SZ7 (GLOVE) ×6 IMPLANT
GOWN PREVENTION PLUS LG XLONG (DISPOSABLE) ×3 IMPLANT
GOWN PREVENTION PLUS XLARGE (GOWN DISPOSABLE) ×3 IMPLANT
NDL INSUFFLATION 14GA 120MM (NEEDLE) IMPLANT
NEEDLE INSUFFLATION 14GA 120MM (NEEDLE) IMPLANT
NS IRRIG 1000ML POUR BTL (IV SOLUTION) ×3 IMPLANT
PACK LAPAROSCOPY BASIN (CUSTOM PROCEDURE TRAY) ×3 IMPLANT
POUCH SPECIMEN RETRIEVAL 10MM (ENDOMECHANICALS) IMPLANT
PROTECTOR NERVE ULNAR (MISCELLANEOUS) ×3 IMPLANT
SCISSORS LAP 5X35 DISP (ENDOMECHANICALS) ×1 IMPLANT
SCISSORS LAP 5X45 EPIX DISP (ENDOMECHANICALS) IMPLANT
SEALER TISSUE G2 CVD JAW 45CM (ENDOMECHANICALS) IMPLANT
SET CYSTO W/LG BORE CLAMP LF (SET/KITS/TRAYS/PACK) ×1 IMPLANT
SET IRRIG TUBING LAPAROSCOPIC (IRRIGATION / IRRIGATOR) IMPLANT
SUT VIC AB 3-0 PS2 18 (SUTURE) ×3
SUT VIC AB 3-0 PS2 18XBRD (SUTURE) ×2 IMPLANT
SUT VICRYL 0 ENDOLOOP (SUTURE) IMPLANT
SUT VICRYL 0 UR6 27IN ABS (SUTURE) IMPLANT
TOWEL OR 17X24 6PK STRL BLUE (TOWEL DISPOSABLE) ×6 IMPLANT
TROCAR BALLN 12MMX100 BLUNT (TROCAR) ×1 IMPLANT
TROCAR Z-THREAD BLADED 11X100M (TROCAR) IMPLANT
TROCAR Z-THREAD BLADED 5X100MM (TROCAR) ×1 IMPLANT
WARMER LAPAROSCOPE (MISCELLANEOUS) ×3 IMPLANT
WATER STERILE IRR 1000ML POUR (IV SOLUTION) ×3 IMPLANT

## 2011-11-17 NOTE — Brief Op Note (Signed)
11/17/2011  8:35 AM  PATIENT:  [name]  46 y.o. female  PRE-OPERATIVE DIAGNOSIS:  Pelvic pain; Dyspareunia  POST-OPERATIVE DIAGNOSIS:  Pelvic pain; Dyspareunia  PROCEDURE:  Procedure(s) (LRB): LAPAROSCOPY OPERATIVE (N/A) CYSTOSCOPY ()  SURGEON:  Surgeon(s) and Role:    * Juluis Mire, MD - Primary  PHYSICIAN ASSISTANT:   ASSISTANTS: none   ANESTHESIA:   general  EBL:  Total I/O In: 1300 [I.V.:1300] Out: -   BLOOD ADMINISTERED:none  DRAINS: none   LOCAL MEDICATIONS USED:  MARCAINE    and OTHER marcaine with dexamethasone  SPECIMEN:  No Specimen  DISPOSITION OF SPECIMEN:  N/A  COUNTS:  YES  TOURNIQUET:  * No tourniquets in log *  DICTATION: .Other Dictation: Dictation Number [id number]  PLAN OF CARE: Discharge to home after PACU  PATIENT DISPOSITION:  PACU - hemodynamically stable.   Delay start of Pharmacological VTE agent (>24hrs) due to surgical blood loss or risk of bleeding: no

## 2011-11-17 NOTE — Anesthesia Postprocedure Evaluation (Signed)
Anesthesia Post Note  Patient: [name]  Procedure(s) Performed: Procedure(s) (LRB): LAPAROSCOPY OPERATIVE (N/A) CYSTOSCOPY ()  Anesthesia type: General  Patient location: PACU  Post pain: Pain level controlled  Post assessment: Post-op Vital signs reviewed  Last Vitals:  Filed Vitals:   11/17/11 0945  BP: 101/56  Pulse: 55  Temp: 36.4 C  Resp: 27    Post vital signs: Reviewed  Level of consciousness: sedated  Complications: No apparent anesthesia complications

## 2011-11-17 NOTE — Op Note (Signed)
Patient name  [name], [place] DICTATION#  [id number] CSN# [id number]  Juluis Mire, MD 11/17/2011 8:37 AM

## 2011-11-17 NOTE — Transfer of Care (Signed)
Immediate Anesthesia Transfer of Care Note  Patient: [name]  Procedure(s) Performed: Procedure(s) (LRB): LAPAROSCOPY OPERATIVE (N/A) CYSTOSCOPY ()  Patient Location: PACU  Anesthesia Type: General  Level of Consciousness: sedated  Airway & Oxygen Therapy: Patient Spontanous Breathing and Patient connected to nasal cannula oxygen  Post-op Assessment: Report given to PACU RN and Post -op Vital signs reviewed and stable  Post vital signs: Reviewed and stable  Complications: No apparent anesthesia complications

## 2011-11-17 NOTE — H&P (Signed)
  History and physical exam unchanged 

## 2011-11-17 NOTE — Discharge Instructions (Signed)

## 2011-11-17 NOTE — Op Note (Signed)
[name], [name] NO.:  [id number]  MEDICAL RECORD NO.:  [id number]  LOCATION:  [facility]                          FACILITY:  [facility]  PHYSICIAN:  Juluis Mire, M.D.   DATE OF BIRTH:  01/17/1966  DATE OF PROCEDURE:  11/17/2011 DATE OF DISCHARGE:                              OPERATIVE REPORT   PREOPERATIVE DIAGNOSIS:  Dyspareunia.  POSTOPERATIVE DIAGNOSIS:  Dyspareunia.  OPERATIVE PROCEDURE:  Open laparoscopy.  Removal of scar tissue and cautery of scar tissue, injection of vaginal cuff.  SURGEON:  Juluis Mire, MD  ANESTHESIA:  General endotracheal.  ESTIMATED BLOOD LOSS:  Minimal.  PACKS AND DRAINS:  None.  INTRAOPERATIVE BLOOD PLACED:  None.  COMPLICATIONS:  None.  INDICATION:  In dictated history and physical.  PROCEDURE IN DETAIL:  The patient was taken to the OR and placed in supine position.  After satisfactory level of general endotracheal anesthesia was obtained, the patient was placed in dorsal lithotomy position using the Allen stirrups.  The abdomen, perineum, and vagina were prepped out with Betadine.  Bladder was emptied via catheterization.  The patient was draped in sterile field.  Subumbilical incision was made with knife, and carried through the subcutaneous tissue.  The fascia was identified and entered sharply.  The peritoneum was entered with blunt finger pressure.  Open laparoscopic trocar was put in place and secured.  The abdomen was insufflated with carbon dioxide.  Laparoscope was introduced.  There was no evidence of injury to adjacent organs.  A 5-mm trocar was put in place in suprapubic area under direct visualization.  Upper abdomen including liver, tip of the gallbladder, both lateral gutters including appendix was clear.  Both ovaries were well on the pelvis and unremarkable.  She did have some whitish like scar tissue located on each end of the vaginal cuff.  We brought in the monopolar scissors on the right side.  We  actually excised the tissue on the left side because it was close to the ureter, we cauterized it.  There was another area of white looking scar tissue up higher on the left pelvic sidewall, this was also cauterized using a monopolar.  At this point in time, we brought in the pudendal set.  We used a combination of Marcaine and dexamethasone, and using the pudendal kit we injected it all through the vaginal cuff.  We then took a spinal needle suprapubically and further injected the vaginal cuff.  We had a little hematoma developed on the right side, but it remained soft.  Because of the dissection on the right side near the ureter, I went ahead and cystoscoped her.  She had been given indigo carmine.  Both ureteral orifices were identified and noted to be spilling blue-tinged urine. The bladder as a whole looked normal.  Did not see any evidence of interstitial cystitis.  The cystoscope was removed and the bladder was drained.  We reinflated the abdomen.  Looking at the area near the right vaginal cuff, the hematoma was soft and did not expand.  At this point in time, the abdomen was deflated of carbon dioxide.  All trocars removed.  Subumbilical incision was closed with 1 figure-of-eight  0 Vicryl to bring the fascia together, the skin was closed with interrupted subcuticulars of 4-0 Vicryl, suprapubic incision was closed with Dermabond.  The patient was taken out of dorsal lithotomy position. Once alert and extubated, transferred to the recovery room in good condition.  Sponge, instrument, and needle count was reported as correct by the circulating nurse x2.     Juluis Mire, M.D.     JSM/MEDQ  D:  11/17/2011  T:  11/17/2011  Job:  [id number]

## 2011-11-17 NOTE — Anesthesia Preprocedure Evaluation (Signed)

## 2011-11-20 ENCOUNTER — Inpatient Hospital Stay (HOSPITAL_COMMUNITY): Payer: BC Managed Care – PPO

## 2011-11-20 ENCOUNTER — Inpatient Hospital Stay (HOSPITAL_COMMUNITY)
Admission: AD | Admit: 2011-11-20 | Discharge: 2011-11-20 | Disposition: A | Discharge status: Home / Self Care | Payer: BC Managed Care – PPO | Source: Ambulatory Visit | Attending: Obstetrics and Gynecology | Admitting: Obstetrics and Gynecology

## 2011-11-20 ENCOUNTER — Encounter (HOSPITAL_COMMUNITY): Payer: Self-pay | Admitting: *Deleted

## 2011-11-20 DIAGNOSIS — R109 Unspecified abdominal pain: Secondary | ICD-10-CM | POA: Insufficient documentation

## 2011-11-20 DIAGNOSIS — R141 Gas pain: Secondary | ICD-10-CM | POA: Insufficient documentation

## 2011-11-20 DIAGNOSIS — R143 Flatulence: Secondary | ICD-10-CM

## 2011-11-20 DIAGNOSIS — R142 Eructation: Secondary | ICD-10-CM | POA: Insufficient documentation

## 2011-11-20 DIAGNOSIS — R140 Abdominal distension (gaseous): Secondary | ICD-10-CM

## 2011-11-20 LAB — CBC
HCT: 41.5 % (ref 36.0–46.0)
Hemoglobin: 13.9 g/dL (ref 12.0–15.0)
MCH: 32.8 pg (ref 26.0–34.0)
MCHC: 33.5 g/dL (ref 30.0–36.0)
MCV: 97.9 fL (ref 78.0–100.0)
Platelets: 216 10*3/uL (ref 150–400)
RBC: 4.24 MIL/uL (ref 3.87–5.11)
RDW: 12.1 % (ref 11.5–15.5)
WBC: 6.7 10*3/uL (ref 4.0–10.5)

## 2011-11-20 MED ORDER — SIMETHICONE 80 MG PO CHEW: 80.0000 mg | CHEWABLE_TABLET | Freq: Four times a day (QID) | ORAL | Status: DC | PRN

## 2011-11-20 MED ORDER — GI COCKTAIL ~~LOC~~
30.0000 mL | Freq: Once | ORAL | Status: AC
  Administered 2011-11-20: 30 mL via ORAL
  Filled 2011-11-20: qty 30

## 2011-11-20 MED ORDER — SIMETHICONE 80 MG PO CHEW
160.0000 mg | CHEWABLE_TABLET | Freq: Once | ORAL | Status: AC
  Administered 2011-11-20: 160 mg via ORAL
  Filled 2011-11-20: qty 2

## 2011-11-20 MED ORDER — GI COCKTAIL ~~LOC~~: 30.0000 mL | Freq: Two times a day (BID) | ORAL | Status: DC

## 2011-11-20 NOTE — MAU Note (Signed)
Upper abdominal pain had laproscopic surgery on Tuesday for diagnostic has tried everything to move it out, had bowel movement since surgery.

## 2011-11-20 NOTE — MAU Provider Note (Signed)
Chief Complaint:  Abdominal Pain    First Provider Initiated Contact with Patient 11/20/11 1601      [name] is  46 y.o. [id number].  No LMP recorded. Patient has had a hysterectomy..   She presents complaining of Abdominal Pain  Status post Open laparoscopy. Removal of scar tissue and cautery of scar tissue, injection of vaginal cuff by Dr. Arelia Sneddon on 11/17/2011. Reports abd bloating and gas pain unreleived by medications, position changes and bowel movement. Unable to pass flatus or belch. Reports adequate BM this morning. Not taking pain meds  Obstetrical/Gynecological History: OB History    Grav Para Term Preterm Abortions TAB SAB Ect Mult Living   4    2 2    2       Past Medical History: Past Medical History  Diagnosis Date  . IBS (irritable bowel syndrome)   . Anxiety   . Migraines     Past Surgical History: Past Surgical History  Procedure Date  . Abdominal hysterectomy   . Cholecystectomy   . Diagnostic laparoscopy   . Laparoscopy 11/17/2011    Procedure: LAPAROSCOPY OPERATIVE;  Surgeon: Juluis Mire, MD;  Location: [facility];  Service: Gynecology;  Laterality: N/A;  with Injection of Vaginal Cuff with Dexamethasone mixed in 0.5% Bupivacaine  . Cystoscopy 11/17/2011    Procedure: CYSTOSCOPY;  Surgeon: Juluis Mire, MD;  Location: [facility];  Service: Gynecology;;    Family History: Family History  Problem Relation Age of Onset  . Diabetes Mother     both sides grandparents  . Thyroid disease Father   . Hypertension Mother   . Colon polyps Mother     Social History: History  Substance Use Topics  . Smoking status: Never Smoker   . Smokeless tobacco: Never Used  . Alcohol Use: No    Allergies:  Allergies  Allergen Reactions  . Tylox (Oxycodone-Acetaminophen) Itching  . Codeine     nausea    Prescriptions prior to admission  Medication Sig Dispense Refill  . gabapentin (NEURONTIN) 300 MG capsule Take 300 mg by mouth daily.      Marland Kitchen ketoprofen  (ORUDIS) 75 MG capsule Take 75 mg by mouth 4 (four) times daily as needed.      . rizatriptan (MAXALT) 10 MG tablet Take 10 mg by mouth as needed. May repeat in 2 hours if needed      . spironolactone (ALDACTONE) 25 MG tablet Take 25 mg by mouth daily.      Marland Kitchen topiramate (TOPAMAX) 100 MG tablet Take 50-100 mg by mouth 2 (two) times daily. Patient takes 100 mg at night and 50 mg in the morning        Review of Systems - Negative except what has been reviewed in HPI  Physical Exam   Blood pressure 153/95, pulse 71, temperature 98.8 F (37.1 C), temperature source Oral.  General: General appearance - alert, well appearing, and in no distress and oriented to person, place, and time Mental status - alert, oriented to person, place, and time, normal mood, behavior, speech, dress, motor activity, and thought processes, uncomfortable with movement Abdomen - tenderness noted diffuse, distended, tympanic, Drsg: C/D/I bowel sounds hyperactive Focused Gynecological Exam: examination not indicated  Labs: No results found for this or any previous visit (from the past 24 hour(s)). Imaging Studies:  Dg Abd 1 View  11/20/2011  *RADIOLOGY REPORT*  Clinical Data: History of abdominal pain.  ABDOMEN - 1 VIEW  Comparison: GI small bowel series 08/10/2011.  Findings: Cholecystectomy clips are present.  Bowel gas pattern is nonspecific.  No obstructive pattern is seen.  Pelvic phleboliths are present.  No opaque calculi are seen. There is minimal degenerative spondylosis.  IMPRESSION: No acute abdominal abnormality is evident.  Bowel gas pattern is nonspecific.  No opaque calculi are seen.  Original Report Authenticated By: Crawford Givens, M.D.     Assessment: Post-operative Gas pain  Plan: Care assumed by [facility] E. 11/20/2011,5:06 PM   Pt reports slight improvement of pain after GI cocktail;  Dr. Arelia Sneddon called and given report (xray, labs, pt status)>DC home with Simethicone and follow-up  next week.  [facility]

## 2011-11-20 NOTE — Discharge Instructions (Signed)
Bloating  Bloating is the feeling of fullness in your belly. You may feel as though your pants are too tight. Often the cause of bloating is overeating, retaining fluids, or having gas in your bowel. It is also caused by swallowing air and eating foods that cause gas. Irritable bowel syndrome is one of the most common causes of bloating. Constipation is also a common cause. Sometimes more serious problems can cause bloating.  SYMPTOMS    Usually there is a feeling of fullness, as though your abdomen is bulged out. There may be mild discomfort.    DIAGNOSIS    Usually no particular testing is necessary for most bloating. If the condition persists and seems to become worse, your caregiver may do additional testing.    TREATMENT     There is no direct treatment for bloating.    Do not put gas into the bowel. Avoid chewing gum and sucking on candy. These tend to make you swallow air. Swallowing air can also be a nervous habit. Try to avoid this.    Avoiding high residue diets will help. Eat foods with soluble fibers (examples include root vegetables, apples, or barley) and substitute dairy products with soy and rice products. This helps irritable bowel syndrome.    If constipation is the cause, then a high residue diet with more fiber will help.    Avoid carbonated beverages.    Over-the-counter preparations are available that help reduce gas. Your pharmacist can help you with this.   SEEK MEDICAL CARE IF:     Bloating continues and seems to be getting worse.    You notice a weight gain.    You have a weight loss but the bloating is getting worse.    You have changes in your bowel habits or develop nausea or vomiting.   SEEK IMMEDIATE MEDICAL CARE IF:     You develop shortness of breath or swelling in your legs.    You have an increase in abdominal pain or develop chest pain.   Document Released: 04/15/2006 Document Revised: 06/04/2011 Document Reviewed: 06/03/2007   [employer] Patient Information 2012 [employer]

## 2012-02-23 ENCOUNTER — Other Ambulatory Visit: Payer: Self-pay | Admitting: Dermatology

## 2012-05-04 DIAGNOSIS — R9409 Abnormal results of other function studies of central nervous system: Secondary | ICD-10-CM | POA: Insufficient documentation

## 2012-05-04 DIAGNOSIS — G43909 Migraine, unspecified, not intractable, without status migrainosus: Secondary | ICD-10-CM | POA: Insufficient documentation

## 2012-05-04 DIAGNOSIS — M5010 Cervical disc disorder with radiculopathy, unspecified cervical region: Secondary | ICD-10-CM | POA: Insufficient documentation

## 2012-05-04 DIAGNOSIS — M25519 Pain in unspecified shoulder: Secondary | ICD-10-CM | POA: Insufficient documentation

## 2012-05-04 DIAGNOSIS — M79609 Pain in unspecified limb: Secondary | ICD-10-CM | POA: Insufficient documentation

## 2012-07-04 ENCOUNTER — Telehealth: Payer: Self-pay | Admitting: *Deleted

## 2012-07-04 MED ORDER — ALPRAZOLAM 0.5 MG PO TABS: 0.5000 mg | ORAL_TABLET | Freq: Every evening | ORAL | Status: DC | PRN

## 2012-07-04 NOTE — Telephone Encounter (Signed)
OK to refill, last OV 1 year ago, will need OV for further refills.

## 2012-07-04 NOTE — Telephone Encounter (Signed)
Patient requests refills of alprazolam. Dr Juanda Chance, can I refill for patient or was this a temporary rx for her?

## 2012-09-09 ENCOUNTER — Telehealth: Payer: Self-pay | Admitting: Internal Medicine

## 2012-09-09 ENCOUNTER — Other Ambulatory Visit: Payer: Self-pay | Admitting: Internal Medicine

## 2012-09-09 MED ORDER — HYOSCYAMINE SULFATE 0.125 MG SL SUBL: SUBLINGUAL_TABLET | SUBLINGUAL | Status: DC

## 2012-09-09 MED ORDER — ALPRAZOLAM 0.5 MG PO TABS: 0.5000 mg | ORAL_TABLET | Freq: Every evening | ORAL | Status: DC | PRN

## 2012-09-09 NOTE — Telephone Encounter (Signed)
rx sent. Patient must keep 10/11/12 appointment for further refills.

## 2012-09-13 ENCOUNTER — Encounter: Payer: Self-pay | Admitting: *Deleted

## 2012-10-11 ENCOUNTER — Ambulatory Visit (INDEPENDENT_AMBULATORY_CARE_PROVIDER_SITE_OTHER): Payer: BC Managed Care – PPO | Admitting: Internal Medicine

## 2012-10-11 ENCOUNTER — Encounter: Payer: Self-pay | Admitting: Internal Medicine

## 2012-10-11 VITALS — BP 104/64 | HR 88 | Ht 68.0 in | Wt 149.4 lb

## 2012-10-11 DIAGNOSIS — R1033 Periumbilical pain: Secondary | ICD-10-CM

## 2012-10-11 MED ORDER — HYOSCYAMINE SULFATE 0.125 MG SL SUBL: SUBLINGUAL_TABLET | SUBLINGUAL | Status: DC

## 2012-10-11 MED ORDER — LINACLOTIDE 290 MCG PO CAPS: 1.0000 | ORAL_CAPSULE | Freq: Every day | ORAL | Status: DC

## 2012-10-11 MED ORDER — ALPRAZOLAM 0.5 MG PO TABS: 0.5000 mg | ORAL_TABLET | Freq: Every evening | ORAL | Status: DC | PRN

## 2012-10-11 MED ORDER — MOVIPREP 100 G PO SOLR: 1.0000 | Freq: Once | ORAL | Status: DC

## 2012-10-11 NOTE — Patient Instructions (Addendum)
You have been scheduled for a colonoscopy with propofol. Please follow written instructions given to you at your visit today.  Please pick up your prep kit at the pharmacy within the next 1-3 days. If you use inhalers (even only as needed), please bring them with you on the day of your procedure.  We have sent the following medications to your pharmacy for you to pick up at your convenience: Linzess 290- Take 1 capsule once daily Xanax Levsin SL  Your physician has requested that you go to the basement for the following lab work before leaving today: TSH, Sed Rate, Celiac 10 Panel  Dr Meredith Leeds, Dr Arelia Sneddon

## 2012-10-11 NOTE — Progress Notes (Signed)
[name] [birth year] MRN [id number]   History of Present Illness:  This is a 47 year old white female with intermittent crampy abdominal pain occurring about twice a month being very severe at times. She has to get on her knees and tries to massage her abdomen. She has a history of endometriosis and underwent an open laparoscopy and removal of scar tissue and injectiosn of the vaginal cuff by Dr.McComb in May 2013. A GI workup consisted of a small bowel follow through in February 2013 which showed a normal transit time at 1 hour 35 minutes. The small bowel was located in the pelvis. She takes xanax 0.5 mg when necessary for spasm and she also takes Levsin sublingual 0.125 mg when necessary for crampy abdominal pain. Both medications seem to be helpful.   Past Medical History  Diagnosis Date  . IBS (irritable bowel syndrome)   . Anxiety   . Migraines   . Neuropathy   . Endometriosis    Past Surgical History  Procedure Laterality Date  . Abdominal hysterectomy    . Cholecystectomy    . Diagnostic laparoscopy    . Laparoscopy  11/17/2011    Procedure: LAPAROSCOPY OPERATIVE;  Surgeon: Juluis Mire, MD;  Location: [facility];  Service: Gynecology;  Laterality: N/A;  with Injection of Vaginal Cuff with Dexamethasone mixed in 0.5% Bupivacaine  . Cystoscopy  11/17/2011    Procedure: CYSTOSCOPY;  Surgeon: Juluis Mire, MD;  Location: [facility];  Service: Gynecology;;    reports that she has never smoked. She has never used smokeless tobacco. She reports that she does not drink alcohol or use illicit drugs. family history includes Colon polyps in her mother; Diabetes in her mother; Hypertension in her mother; and Thyroid disease in her father. Allergies  Allergen Reactions  . Tylox (Oxycodone-Acetaminophen) Itching  . Codeine     nausea        Review of Systems:Denies upper GI symptoms nausea or vomiting. Positive for cold intolerance  The remainder of the 10 point ROS is negative except  as outlined in H&P    Assessment and Plan: Problem #33 47 year old white female with intermittent episodic abdominal pain and history of endometriosis. Her symptoms are consistent with constipation predominant irritable bowel syndrome. We will refill her Xanax and Levsin but will also give her a trial of Linzess 290 mcg daily. We will check her TSH, sedimentation rate and sprue profile. She will scheduled for a colonoscopy as she has never had one. I doubt that we are dealing with inflammatory bowel disease or partial obstruction, however, she may have significant diverticulosis.   10/11/2012 [name]

## 2012-10-13 ENCOUNTER — Other Ambulatory Visit (INDEPENDENT_AMBULATORY_CARE_PROVIDER_SITE_OTHER): Payer: BC Managed Care – PPO

## 2012-10-13 DIAGNOSIS — R1033 Periumbilical pain: Secondary | ICD-10-CM

## 2012-10-13 LAB — SEDIMENTATION RATE: Sed Rate: 6 mm/hr (ref 0–22)

## 2012-10-13 LAB — TSH: TSH: 2.89 u[IU]/mL (ref 0.35–5.50)

## 2012-10-14 LAB — CELIAC PANEL 10
Endomysial Screen: NEGATIVE
Gliadin IgA: 1.8 U/mL (ref <20)
Gliadin IgG: 4.5 U/mL (ref <20)
IgA: 78 mg/dL (ref 69–380)
Tissue Transglut Ab: 7.8 U/mL (ref <20)
Tissue Transglutaminase Ab, IgA: 2.2 U/mL (ref <20)

## 2012-10-18 ENCOUNTER — Ambulatory Visit (AMBULATORY_SURGERY_CENTER): Payer: BC Managed Care – PPO | Admitting: Internal Medicine

## 2012-10-18 ENCOUNTER — Encounter: Payer: Self-pay | Admitting: Internal Medicine

## 2012-10-18 VITALS — BP 113/73 | HR 60 | Temp 98.3°F | Resp 22 | Ht 68.0 in | Wt 149.0 lb

## 2012-10-18 DIAGNOSIS — R1033 Periumbilical pain: Secondary | ICD-10-CM

## 2012-10-18 DIAGNOSIS — Z1211 Encounter for screening for malignant neoplasm of colon: Secondary | ICD-10-CM

## 2012-10-18 MED ORDER — LINACLOTIDE 290 MCG PO CAPS: 1.0000 | ORAL_CAPSULE | Freq: Every day | ORAL | Status: DC

## 2012-10-18 MED ORDER — SODIUM CHLORIDE 0.9 % IV SOLN: 500.0000 mL | INTRAVENOUS | Status: DC

## 2012-10-18 NOTE — Progress Notes (Addendum)
Patient did not have preoperative order for IV antibiotic SSI prophylaxis. [phone])   Patient is in a lot of abd pain, but I have given her levsin, rectal tube, up on all fours.  Still no relief.  Patient states that she is like this at home too, and that nothing works.   We have tried to get her up to the bathroom, but she refuses to go.  Husband brought in to try to convince her to go to the bathroom, and then get dressed. Patient crying about the pain.  Dr. Juanda Chance is aware, and will be in to see patient again shortly.  Nurse is with patient now as well as the husband.  Patient is now up and in the bathroom with the nurse.  Patient states that she wants her husband to come in, but he said that we could deal with it.  He was then coaxed into staying with her in the bathroom per two nurses' requests.  Patient states relief and was sent home at this time.  She was instructed to take her medication when she got home.

## 2012-10-18 NOTE — Op Note (Signed)
[facility] [address] [place], [address]   COLONOSCOPY PROCEDURE REPORT  PATIENT: [name], [name].  MR#: [id number] BIRTHDATE: 10/15/1965 , 46  yrs. old GENDER: Female ENDOSCOPIST: Hart Carwin, MD REFERRED BY:  Richardean Chimera, M.D. PROCEDURE DATE:  10/18/2012 PROCEDURE:   Colonoscopy, diagnostic and Colonoscopy, screening ASA CLASS:   Class I INDICATIONS:abdominal pain in the lower left quadrant and chronic lower abd.  pain, at times severe, hx of endometriosis, s/p recent laparoscopy and lysis of adhesions, , normal labs,. MEDICATIONS: MAC sedation, administered by CRNA and propofol (Diprivan) 300mg  IV  DESCRIPTION OF PROCEDURE:   After the risks and benefits and of the procedure were explained, informed consent was obtained.  A digital rectal exam revealed no abnormalities of the rectum.    The LB [id number]-[id number]  endoscope was introduced through the anus and advanced to the cecum, which was identified by both the appendix and ileocecal valve .  The quality of the prep was good, using MoviPrep .  The instrument was then slowly withdrawn as the colon was fully examined.     COLON FINDINGS: Small internal hemorrhoids were found. Retroflexed views revealed no abnormalities.     The scope was then withdrawn from the patient and the procedure completed.  COMPLICATIONS: There were no complications. ENDOSCOPIC IMPRESSION: Small internal hemorrhoids nothing to account for the abdominal pain, suspect IBS  on the background of prior endometriosis  RECOMMENDATIONS: Linzess 290 ug 1 po qd use antispasm meds prn crampy abd. pain  REPEAT EXAM: In 10 year(s)  for Colonoscopy.  cc:  _______________________________ eSignedHart Carwin, MD 10/18/2012 11:01 AM

## 2012-10-18 NOTE — Patient Instructions (Addendum)
YOU HAD AN ENDOSCOPIC PROCEDURE TODAY AT THE [facility]: Refer to the procedure report that was given to you for any specific questions about what was found during the examination.  If the procedure report does not answer your questions, please call your gastroenterologist to clarify.  If you requested that your care partner not be given the details of your procedure findings, then the procedure report has been included in a sealed envelope for you to review at your convenience later.  YOU SHOULD EXPECT: Some feelings of bloating in the abdomen. Passage of more gas than usual.  Walking can help get rid of the air that was put into your GI tract during the procedure and reduce the bloating. If you had a lower endoscopy (such as a colonoscopy or flexible sigmoidoscopy) you may notice spotting of blood in your stool or on the toilet paper. If you underwent a bowel prep for your procedure, then you may not have a normal bowel movement for a few days.  DIET: Your first meal following the procedure should be a light meal and then it is ok to progress to your normal diet.  A half-sandwich or bowl of soup is an example of a good first meal.  Heavy or fried foods are harder to digest and may make you feel nauseous or bloated.  Likewise meals heavy in dairy and vegetables can cause extra gas to form and this can also increase the bloating.  Drink plenty of fluids but you should avoid alcoholic beverages for 24 hours.  ACTIVITY: Your care partner should take you home directly after the procedure.  You should plan to take it easy, moving slowly for the rest of the day.  You can resume normal activity the day after the procedure however you should NOT DRIVE or use heavy machinery for 24 hours (because of the sedation medicines used during the test).    SYMPTOMS TO REPORT IMMEDIATELY: A gastroenterologist can be reached at any hour.  During normal business hours, 8:30 AM to 5:00 PM Monday through Friday,  call [phone].  After hours and on weekends, please call the GI answering service at [phone] who will take a message and have the physician on call contact you.   Following lower endoscopy (colonoscopy or flexible sigmoidoscopy):  Excessive amounts of blood in the stool  Significant tenderness or worsening of abdominal pains  Swelling of the abdomen that is new, acute  Fever of 100F or higher  FOLLOW UP: If any biopsies were taken you will be contacted by phone or by letter within the next 1-3 weeks.  Call your gastroenterologist if you have not heard about the biopsies in 3 weeks.  Our staff will call the home number listed on your records the next business day following your procedure to check on you and address any questions or concerns that you may have at that time regarding the information given to you following your procedure. This is a courtesy call and so if there is no answer at the home number and we have not heard from you through the emergency physician on call, we will assume that you have returned to your regular daily activities without incident.  TAKE LINZESS ONE EVERYDAY.  USE ANTISPASM MEDS FOR CRAMPY ABDOMINAL PAIN.  SIGNATURES/CONFIDENTIALITY: You and/or your care partner have signed paperwork which will be entered into your electronic medical record.  These signatures attest to the fact that that the information above on your After Visit Summary has  been reviewed and is understood.  Full responsibility of the confidentiality of this discharge information lies with you and/or your care-partner.  Thank-you for choosing [facility] for your medical needs today.

## 2012-10-19 ENCOUNTER — Telehealth: Payer: Self-pay | Admitting: *Deleted

## 2012-10-19 NOTE — Telephone Encounter (Signed)
  Follow up Call-  Call back number 10/18/2012  Post procedure Call Back phone  # [phone]  Permission to leave phone message Yes     Patient questions:  Do you have a fever, pain , or abdominal swelling? no Pain Score  0 *  Have you tolerated food without any problems? yes  Have you been able to return to your normal activities? yes  Do you have any questions about your discharge instructions: Diet   no Medications  no Follow up visit  no  Do you have questions or concerns about your Care? no  Actions: * If pain score is 4 or above: No action needed, pain <4.

## 2012-11-01 ENCOUNTER — Encounter: Payer: Self-pay | Admitting: Neurology

## 2012-11-01 DIAGNOSIS — R9409 Abnormal results of other function studies of central nervous system: Secondary | ICD-10-CM

## 2012-11-01 DIAGNOSIS — G43909 Migraine, unspecified, not intractable, without status migrainosus: Secondary | ICD-10-CM

## 2012-11-01 DIAGNOSIS — M5010 Cervical disc disorder with radiculopathy, unspecified cervical region: Secondary | ICD-10-CM

## 2012-11-01 DIAGNOSIS — M79609 Pain in unspecified limb: Secondary | ICD-10-CM

## 2012-11-02 ENCOUNTER — Telehealth: Payer: Self-pay | Admitting: Neurology

## 2012-11-02 ENCOUNTER — Ambulatory Visit: Payer: Self-pay | Admitting: Neurology

## 2012-11-11 ENCOUNTER — Telehealth: Payer: Self-pay | Admitting: Neurology

## 2012-11-14 NOTE — Telephone Encounter (Signed)
I forwarded this note to scheduling. (request for female).  Placed with Dr. Terrace Arabia.

## 2012-11-18 ENCOUNTER — Ambulatory Visit
Admission: RE | Admit: 2012-11-18 | Discharge: 2012-11-18 | Disposition: A | Discharge status: Home / Self Care | Payer: BC Managed Care – PPO | Source: Ambulatory Visit | Attending: Family Medicine | Admitting: Family Medicine

## 2012-11-18 ENCOUNTER — Other Ambulatory Visit: Payer: Self-pay | Admitting: Family Medicine

## 2012-11-18 ENCOUNTER — Other Ambulatory Visit: Payer: Self-pay

## 2012-11-18 DIAGNOSIS — M542 Cervicalgia: Secondary | ICD-10-CM

## 2012-11-18 MED ORDER — TOPIRAMATE 50 MG PO TABS: ORAL_TABLET | ORAL | Status: DC

## 2012-11-18 NOTE — Telephone Encounter (Signed)
Former [facility] patient assigned to Dr Yan  

## 2012-11-29 ENCOUNTER — Ambulatory Visit (INDEPENDENT_AMBULATORY_CARE_PROVIDER_SITE_OTHER): Payer: BC Managed Care – PPO | Admitting: Neurology

## 2012-11-29 ENCOUNTER — Encounter: Payer: Self-pay | Admitting: Neurology

## 2012-11-29 VITALS — BP 112/73 | HR 83 | Ht 67.0 in | Wt 148.0 lb

## 2012-11-29 DIAGNOSIS — G43909 Migraine, unspecified, not intractable, without status migrainosus: Secondary | ICD-10-CM

## 2012-11-29 DIAGNOSIS — M79609 Pain in unspecified limb: Secondary | ICD-10-CM

## 2012-11-29 DIAGNOSIS — M25519 Pain in unspecified shoulder: Secondary | ICD-10-CM | POA: Insufficient documentation

## 2012-11-29 MED ORDER — DULOXETINE HCL 30 MG PO CPEP: 30.0000 mg | ORAL_CAPSULE | Freq: Every day | ORAL | Status: DC

## 2012-11-29 MED ORDER — KETOPROFEN 75 MG PO CAPS: 75.0000 mg | ORAL_CAPSULE | Freq: Four times a day (QID) | ORAL | Status: DC | PRN

## 2012-11-29 MED ORDER — DULOXETINE HCL 60 MG PO CPEP: 60.0000 mg | ORAL_CAPSULE | Freq: Every day | ORAL | Status: DC

## 2012-11-29 MED ORDER — RIZATRIPTAN BENZOATE 10 MG PO TABS: 10.0000 mg | ORAL_TABLET | ORAL | Status: DC | PRN

## 2012-11-29 MED ORDER — TOPIRAMATE 100 MG PO TABS: 100.0000 mg | ORAL_TABLET | Freq: Two times a day (BID) | ORAL | Status: DC

## 2012-11-29 NOTE — Progress Notes (Signed)
History of Present Illness  47 year old right-handed white married female with a history of headaches followed by Dr. Sandria Manly.  Her headache beginning in childhood since age 47, and changing in pattern since 2009. The headaches are always on the left side, unrelated to her menstrual cycle, and not associated with visual disturbance or nausea and vomiting. She has no history of car sickness as a child, but has a history of endometriosis.   The headaches last intermitently for 5 days and are improved with Ketoprofen, Maxalt therapy, and coffee. Her daughter has been to the headache clinic and uses bio freeze to the scalp as well as Maxalt. [name], son, grandmother, and mother have migraine.   The patient has tried food restrictions without success. She is on topirimate 50 mg in the morning and 100 mg at night without significant side effects. She has word finding difficulties and confusion that have not been serious.   She averages 3 migraine headache days per month. These are throbbing without associated nausea and vomiting made worse by smells such as Vanilla, and can have an ice pick quality. Maxalt and ketoprofen are effective almost 100% of the time.   She also has 4 headaches per week. which are not as severe  Her headaches can last 5 days in a row requiring steroids and currently she is on a tapering course of steroids for a one-month history of headaches.She has a two-year history of pain occurring in her shoulder blade region extending to her left arm along the inner aspect with discomfort in the second, third ,fourth, and fifth fingers of  the left hand. There is no definite numbness but she describes "tingling".   The  pain in the neck and symptoms in the  left arm are made worse by flexion of her neck. Her neck symptoms have improved since discontinuing exercise programs. She reports the onset of symptoms in her right leg with pain extending to the toes of her right foot on the  outer aspect of her right foot for one year.   This began following an exercise that she performed. Her daytime activities include going to the gym, cleaning the house and driving her children. She does not play golf or tennis She enjoys  "spinning" exercises with stationary bicycles.  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   MRI study of the brain without and with contrast 05/13/2006 which I reviewed  is normal. Cervical spine x-rays 08/01/2010 which I reviewed are normal. MRI of the cervical spine 09/16/2010 shows  a shallow central  disc protrusion at C6-7 which is non compressive.  The C7-T1 level is not as well seen. There is a  right lateral pharyngeal recess finding  that is nonspecific.   This was evaluated by Dr. 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 Lumbar MRI 09/29/2010  shows facet arthropathy at L2-3, L3-4, L4-5, and a  small annular rent eccentric to the left at L5-S1.She tolerates medications well without significant side effects.   She has irritable bowel syndrome gas, bloating followed by Dr. Juanda Chance and  currently treated with Xanax, hyoscyamine, and Align.  She underwent ovarian scar tissue surgery 11/17/11 with improvement in symptoms from her gynecologist She developed severe headaches that  were almost continuous 12/2010 and was evaluated in the office where she was treated with IV Depacon. CBC and CMP were unremarkable. She was given a tapering course of prednisone. She has received another prednisone taper since she was last  here by her primary care physician. Since 8/18 when she went to the beach and sat in foldout chairs, she has pain in her right back, swelling in her right foot and lower leg, pain with walking, and numbness in the lateral anterior shin and side of her foot. It is made worse sitting and decreased standing. There is no change with bending over coughing or sneezing.   She gave up exercising and  restarted gabapentin 100 twice a day  and 300 at night. She has had significant improvement since beginning Cymbalta 60 mg per day. She has not had side effects.  UPDATE June 3rd 2014.  She is now taking Cymbalta 47 each day, which helps her low back, leg pain better, she has 3-4 migraine headaches each month, Maxalt, ketoprofen has been very helpful. She denies significant side effect    PHYSICAL EXAMINATOINS:  Generalized: In no acute distress  Neck: Supple, no carotid bruits   Cardiac: Regular rate rhythm  Pulmonary: Clear to auscultation bilaterally  Musculoskeletal: No deformity  Neurological examination  Mentation: Alert oriented to time, place, history taking, and causual conversation  Cranial nerve II-XII: Pupils were equal round reactive to light extraocular movements were full, visual field were full on confrontational test. facial sensation and strength were normal. hearing was intact to finger rubbing bilaterally. Uvula tongue midline.  head turning and shoulder shrug and were normal and symmetric.Tongue protrusion into cheek strength was normal.  Motor: normal tone, bulk and strength.  Sensory: Intact to fine touch, pinprick, preserved vibratory sensation, and proprioception at toes.  Coordination: Normal finger to nose, heel-to-shin bilaterally there was no truncal ataxia  Gait: Rising up from seated position without assistance, normal stance, without trunk ataxia, moderate stride, good arm swing, smooth turning, able to perform tiptoe, and heel walking without difficulty.   Romberg signs: Negative  Deep tendon reflexes: Brachioradialis 2/2, biceps 2/2, triceps 2/2, patellar 2/2, Achilles 2/2, plantar responses were flexor bilaterally.  Assessment and plan:  47 years old right-handed Caucasian female, with history of chronic migraine headaches, chronic low back pain   I have refilled her Cymbalta 60 + 30 mg every day, Continue topiramate increase her dosage to 100 mg twice a day, Maxalt ketoprofen  as needed for migraine abortive treatment,  Return to clinic in one year

## 2013-05-30 ENCOUNTER — Other Ambulatory Visit: Payer: Self-pay | Admitting: Internal Medicine

## 2013-07-10 ENCOUNTER — Other Ambulatory Visit: Payer: Self-pay | Admitting: Internal Medicine

## 2013-08-01 ENCOUNTER — Other Ambulatory Visit: Payer: Self-pay

## 2013-08-01 MED ORDER — KETOPROFEN 75 MG PO CAPS: 75.0000 mg | ORAL_CAPSULE | Freq: Four times a day (QID) | ORAL | Status: DC | PRN

## 2013-08-04 ENCOUNTER — Telehealth: Payer: Self-pay

## 2013-08-04 MED ORDER — DICLOFENAC POTASSIUM(MIGRAINE) 50 MG PO PACK: 50.0000 mg | PACK | ORAL | Status: DC | PRN

## 2013-08-04 NOTE — Telephone Encounter (Signed)
Spoke with [name] form [facility].  She said Ketoprofen is on long term back order, and will not be available for at least 3 months or more.  She would like to know if we can change the Rx to an alternative such as Ibuprofen, Naproxen, Meloxicam or Diclofenac tablets.  Please advise.  Thank you.

## 2013-08-04 NOTE — Telephone Encounter (Signed)
I have contacted the patient. She indicated she does not wish to try Cambia.  She said she would prefer a tablet.  Okay to change to Diclofenac tabs?  Please advise.  Thank you.

## 2013-08-04 NOTE — Telephone Encounter (Signed)
Jessica: Please call Patient, I have called in cambia for her

## 2013-08-07 MED ORDER — DICLOFENAC SODIUM 50 MG PO TBEC: 50.0000 mg | DELAYED_RELEASE_TABLET | ORAL | Status: DC | PRN

## 2013-08-07 NOTE — Telephone Encounter (Signed)
I called the patient back.  Her ins does not cover Cambia.  She would like to have the tablets prescribed instead.  Rx has been updated and sent to the pharmacy.

## 2013-08-07 NOTE — Telephone Encounter (Signed)
Please call patient, diclofenac powder, cambia  supposed to have rapid onset of action, but if she prefers, it is okay to switch her to diclofenac tablet

## 2013-09-06 ENCOUNTER — Encounter (HOSPITAL_COMMUNITY): Payer: Self-pay | Admitting: Emergency Medicine

## 2013-09-06 ENCOUNTER — Emergency Department (HOSPITAL_COMMUNITY)
Admission: EM | Admit: 2013-09-06 | Discharge: 2013-09-06 | Disposition: A | Discharge status: Home / Self Care | Payer: BC Managed Care – PPO | Attending: Emergency Medicine | Admitting: Emergency Medicine

## 2013-09-06 ENCOUNTER — Emergency Department (HOSPITAL_COMMUNITY)
Admission: EM | Admit: 2013-09-06 | Discharge: 2013-09-06 | Disposition: A | Discharge status: Nursing Home (Includes ICF) | Payer: BC Managed Care – PPO | Source: Home / Self Care

## 2013-09-06 ENCOUNTER — Emergency Department (HOSPITAL_COMMUNITY): Payer: BC Managed Care – PPO

## 2013-09-06 ENCOUNTER — Emergency Department (INDEPENDENT_AMBULATORY_CARE_PROVIDER_SITE_OTHER): Payer: BC Managed Care – PPO

## 2013-09-06 DIAGNOSIS — R52 Pain, unspecified: Secondary | ICD-10-CM

## 2013-09-06 DIAGNOSIS — N83209 Unspecified ovarian cyst, unspecified side: Secondary | ICD-10-CM | POA: Insufficient documentation

## 2013-09-06 DIAGNOSIS — R1032 Left lower quadrant pain: Secondary | ICD-10-CM

## 2013-09-06 DIAGNOSIS — Z9089 Acquired absence of other organs: Secondary | ICD-10-CM | POA: Insufficient documentation

## 2013-09-06 DIAGNOSIS — R112 Nausea with vomiting, unspecified: Secondary | ICD-10-CM | POA: Insufficient documentation

## 2013-09-06 DIAGNOSIS — Z90710 Acquired absence of both cervix and uterus: Secondary | ICD-10-CM | POA: Insufficient documentation

## 2013-09-06 DIAGNOSIS — Z8739 Personal history of other diseases of the musculoskeletal system and connective tissue: Secondary | ICD-10-CM | POA: Insufficient documentation

## 2013-09-06 DIAGNOSIS — Z862 Personal history of diseases of the blood and blood-forming organs and certain disorders involving the immune mechanism: Secondary | ICD-10-CM | POA: Insufficient documentation

## 2013-09-06 DIAGNOSIS — F411 Generalized anxiety disorder: Secondary | ICD-10-CM | POA: Insufficient documentation

## 2013-09-06 DIAGNOSIS — G43909 Migraine, unspecified, not intractable, without status migrainosus: Secondary | ICD-10-CM | POA: Insufficient documentation

## 2013-09-06 DIAGNOSIS — Z8719 Personal history of other diseases of the digestive system: Secondary | ICD-10-CM | POA: Insufficient documentation

## 2013-09-06 DIAGNOSIS — Z79899 Other long term (current) drug therapy: Secondary | ICD-10-CM | POA: Insufficient documentation

## 2013-09-06 HISTORY — DX: Acquired absence of both cervix and uterus: Z90.710

## 2013-09-06 LAB — CBC WITH DIFFERENTIAL/PLATELET
Basophils Absolute: 0 10*3/uL (ref 0.0–0.1)
Basophils Relative: 0 % (ref 0–1)
Eosinophils Absolute: 0.1 10*3/uL (ref 0.0–0.7)
Eosinophils Relative: 1 % (ref 0–5)
HCT: 44 % (ref 36.0–46.0)
Hemoglobin: 15.3 g/dL — ABNORMAL HIGH (ref 12.0–15.0)
Lymphocytes Relative: 15 % (ref 12–46)
Lymphs Abs: 1.1 10*3/uL (ref 0.7–4.0)
MCH: 33.5 pg (ref 26.0–34.0)
MCHC: 34.8 g/dL (ref 30.0–36.0)
MCV: 96.3 fL (ref 78.0–100.0)
Monocytes Absolute: 0.6 10*3/uL (ref 0.1–1.0)
Monocytes Relative: 9 % (ref 3–12)
Neutro Abs: 5.4 10*3/uL (ref 1.7–7.7)
Neutrophils Relative %: 75 % (ref 43–77)
Platelets: 223 10*3/uL (ref 150–400)
RBC: 4.57 MIL/uL (ref 3.87–5.11)
RDW: 12.4 % (ref 11.5–15.5)
WBC: 7.3 10*3/uL (ref 4.0–10.5)

## 2013-09-06 LAB — COMPREHENSIVE METABOLIC PANEL
ALT: 25 U/L (ref 0–35)
AST: 17 U/L (ref 0–37)
Albumin: 4.1 g/dL (ref 3.5–5.2)
Alkaline Phosphatase: 45 U/L (ref 39–117)
BUN: 15 mg/dL (ref 6–23)
CO2: 22 mEq/L (ref 19–32)
Calcium: 9.5 mg/dL (ref 8.4–10.5)
Chloride: 106 mEq/L (ref 96–112)
Creatinine, Ser: 0.74 mg/dL (ref 0.50–1.10)
GFR calc Af Amer: >90 mL/min (ref >90)
GFR calc non Af Amer: >90 mL/min (ref >90)
Glucose, Bld: 104 mg/dL — ABNORMAL HIGH (ref 70–99)
Potassium: 4.1 mEq/L (ref 3.7–5.3)
Sodium: 141 mEq/L (ref 137–147)
Total Bilirubin: 0.4 mg/dL (ref 0.3–1.2)
Total Protein: 6.7 g/dL (ref 6.0–8.3)

## 2013-09-06 LAB — POCT URINALYSIS DIP (DEVICE)
Bilirubin Urine: NEGATIVE
Glucose, UA: NEGATIVE mg/dL
Hgb urine dipstick: NEGATIVE
Leukocytes, UA: NEGATIVE
Nitrite: NEGATIVE
Protein, ur: NEGATIVE mg/dL
Specific Gravity, Urine: 1.015 (ref 1.005–1.030)
Urobilinogen, UA: 0.2 mg/dL (ref 0.0–1.0)
pH: 7.5 (ref 5.0–8.0)

## 2013-09-06 LAB — LIPASE, BLOOD: Lipase: 37 U/L (ref 11–59)

## 2013-09-06 MED ORDER — MORPHINE SULFATE 4 MG/ML IJ SOLN
4.0000 mg | Freq: Once | INTRAMUSCULAR | Status: DC
  Filled 2013-09-06: qty 1

## 2013-09-06 MED ORDER — HYDROCODONE-ACETAMINOPHEN 5-325 MG PO TABS
1.0000 | ORAL_TABLET | Freq: Once | ORAL | Status: AC
  Administered 2013-09-06: 1 via ORAL
  Filled 2013-09-06: qty 1

## 2013-09-06 MED ORDER — IOHEXOL 300 MG/ML  SOLN
100.0000 mL | Freq: Once | INTRAMUSCULAR | Status: AC | PRN
  Administered 2013-09-06: 100 mL via INTRAVENOUS

## 2013-09-06 MED ORDER — IOHEXOL 300 MG/ML  SOLN
25.0000 mL | Freq: Once | INTRAMUSCULAR | Status: AC | PRN
  Administered 2013-09-06: 25 mL via ORAL

## 2013-09-06 MED ORDER — MORPHINE SULFATE 4 MG/ML IJ SOLN
4.0000 mg | Freq: Once | INTRAMUSCULAR | Status: AC
  Administered 2013-09-06: 4 mg via INTRAVENOUS
  Filled 2013-09-06: qty 1

## 2013-09-06 MED ORDER — MORPHINE SULFATE 4 MG/ML IJ SOLN: 4.0000 mg | Freq: Once | INTRAMUSCULAR | Status: DC

## 2013-09-06 MED ORDER — ONDANSETRON HCL 4 MG/2ML IJ SOLN
4.0000 mg | Freq: Once | INTRAMUSCULAR | Status: AC
  Administered 2013-09-06: 4 mg via INTRAVENOUS
  Filled 2013-09-06: qty 2

## 2013-09-06 MED ORDER — HYDROCODONE-ACETAMINOPHEN 5-325 MG PO TABS: 1.0000 | ORAL_TABLET | ORAL | Status: DC | PRN

## 2013-09-06 MED ORDER — ONDANSETRON HCL 4 MG/2ML IJ SOLN: 4.0000 mg | Freq: Once | INTRAMUSCULAR | Status: DC

## 2013-09-06 MED ORDER — ONDANSETRON HCL 4 MG PO TABS: 4.0000 mg | ORAL_TABLET | Freq: Four times a day (QID) | ORAL | Status: DC

## 2013-09-06 NOTE — ED Provider Notes (Signed)
CSN: [id number]     Arrival date & time 09/06/13  1023 History   None    Chief Complaint  Patient presents with  . Abdominal Pain   (Consider location/radiation/quality/duration/timing/severity/associated sxs/prior Treatment) HPI Comments: 48 year old female with a many year history of IBS and Anxiety developed pain primarily to the left lower quadrant of the abdomen yesterday afternoon at 1 PM. She states the pain radiates from the lower left pelvis superiorly along the descending colon to the costochondral margin. The pain became serious later in the day and last night and was unable to sleep due to the pain. She initially took Gas-X which did not help and then took Henderson which also did not help. She vomited once when the pain was severe. She states the pain is constant but gets much worse at times. It occasionally radiates to the left back.   Past Medical History  Diagnosis Date  . IBS (irritable bowel syndrome)   . Anxiety   . Migraines   . Neuropathy   . Endometriosis   . Anemia   . Shoulder pain   . Brachial neuritis or radiculitis NOS   . Migraine, unspecified, without mention of intractable migraine without mention of status migrainosus    Past Surgical History  Procedure Laterality Date  . Abdominal hysterectomy    . Cholecystectomy    . Diagnostic laparoscopy    . Laparoscopy  11/17/2011    Procedure: LAPAROSCOPY OPERATIVE;  Surgeon: Darlyn Chamber, MD;  Location: [facility];  Service: Gynecology;  Laterality: N/A;  with Injection of Vaginal Cuff with Dexamethasone mixed in 0.5% Bupivacaine  . Cystoscopy  11/17/2011    Procedure: CYSTOSCOPY;  Surgeon: Darlyn Chamber, MD;  Location: [facility];  Service: Gynecology;;   Family History  Problem Relation Age of Onset  . Diabetes Mother     both sides grandparents  . Hypertension Mother   . Colon polyps Mother   . Thyroid disease Father   . Colon polyps Father    History  Substance Use Topics  . Smoking status: Never Smoker   .  Smokeless tobacco: Never Used  . Alcohol Use: No   OB History   Grav Para Term Preterm Abortions TAB SAB Ect Mult Living   4    2 2    2      Review of Systems  Constitutional: Positive for activity change and appetite change. Negative for fever and fatigue.  HENT: Negative.   Respiratory: Negative.   Cardiovascular: Negative.   Gastrointestinal: Positive for nausea, vomiting and abdominal pain. Negative for diarrhea, constipation, blood in stool and abdominal distention.  Genitourinary: Negative.        History of hysterectomy. Bilateral ovaries present.  Musculoskeletal: Negative.   Skin: Negative.   Neurological: Negative.     Allergies  Tylox and Codeine  Home Medications   Current Outpatient Rx  Name  Route  Sig  Dispense  Refill  . ALPRAZolam (XANAX) 0.5 MG tablet      TAKE 1 TABLET BY MOUTH AT BEDTIME AS NEEDED   30 tablet   1   . cetirizine (ZYRTEC) 10 MG tablet   Oral   Take 10 mg by mouth daily.         . diclofenac (VOLTAREN) 50 MG EC tablet   Oral   Take 1 tablet (50 mg total) by mouth as needed (for headache pain.  Do not exceed 100mg  daily).   30 tablet   6   . DULoxetine (  CYMBALTA) 30 MG capsule   Oral   Take 1 capsule (30 mg total) by mouth daily.   30 capsule   12   . DULoxetine (CYMBALTA) 60 MG capsule   Oral   Take 1 capsule (60 mg total) by mouth daily.   30 capsule   12   . hyoscyamine (LEVSIN SL) 0.125 MG SL tablet      Dissolve 1 tablet by mouth under the tongue every 4-6 hours as needed   30 tablet   2   . LINZESS 290 MCG CAPS capsule      TAKE ONE CAPSULE BY MOUTH EVERY DAY   30 capsule   1   . Multiple Vitamin (MULTIVITAMIN) capsule   Oral   Take 1 capsule by mouth daily.         . predniSONE (DELTASONE) 10 MG tablet      10 mg daily.         . rizatriptan (MAXALT) 10 MG tablet   Oral   Take 1 tablet (10 mg total) by mouth as needed. May repeat in 2 hours if needed   15 tablet   12   . EXPIRED:  simethicone (GAS-X) 80 MG chewable tablet   Oral   Chew 1 tablet (80 mg total) by mouth every 6 (six) hours as needed for flatulence.   30 tablet   0   . spironolactone (ALDACTONE) 25 MG tablet   Oral   Take 25 mg by mouth daily.         Marland Kitchen topiramate (TOPAMAX) 100 MG tablet   Oral   Take 1 tablet (100 mg total) by mouth 2 (two) times daily. Take one tablet po in the morning and take two tabs po at night   60 tablet   12    BP 126/76  Pulse 79  Temp(Src) 98.6 F (37 C) (Oral)  SpO2 100% Physical Exam  Nursing note and vitals reviewed. Constitutional: She is oriented to person, place, and time. She appears well-developed and well-nourished. No distress.  Eyes: Conjunctivae and EOM are normal.  Neck: Normal range of motion. Neck supple.  Cardiovascular: Normal rate, regular rhythm and normal heart sounds.   Pulmonary/Chest: Effort normal and breath sounds normal. No respiratory distress.  Abdominal: Soft. Bowel sounds are normal. She exhibits no distension and no mass. There is no tenderness. There is no rebound and no guarding.  Abdomen scaphoid. There is no tenderness along the left upper and lower abdomen where she points as the source of her pain. Percusses tympanic in all quads  Musculoskeletal: She exhibits no edema.  Neurological: She is alert and oriented to person, place, and time. She exhibits normal muscle tone.  Skin: Skin is warm and dry.  Psychiatric: She has a normal mood and affect.    ED Course  Procedures (including critical care time) Labs Review Labs Reviewed  POCT URINALYSIS DIP (DEVICE) - Abnormal; Notable for the following:    Ketones, ur TRACE (*)    All other components within normal limits   Imaging Review Dg Abd 1 View  09/06/2013   CLINICAL DATA Abdominal pain  EXAM ABDOMEN - 1 VIEW  COMPARISON 11/20/2011  FINDINGS The bowel gas pattern is normal. No radio-opaque calculi or other significant radiographic abnormality are seen.  IMPRESSION  Negative.  SIGNATURE  Electronically Signed   By: Franchot Gallo M.D.   On: 09/06/2013 13:28     MDM   1. Acute left lower quadrant pain  Physical exam, abd Xray WNL.  No acute abdomen. Pt stable. She is going out of town tomorrow and requesting more definitive diagnosis. Transfer to the ED for consideration of imaging.     Janne Napoleon, NP 09/06/13 1346  Janne Napoleon, NP 09/06/13 1347

## 2013-09-06 NOTE — ED Notes (Signed)
Pt   Reports     Lower  abd  Pain       -that  Started yest  Nausea  /  Vomited    Earlier   -  denys  Any bleeding /  Discharge        Ambulated  To room  With a  Steady  Fluid  Gait

## 2013-09-06 NOTE — ED Provider Notes (Signed)
CSN: [id number]     Arrival date & time 09/06/13  1411 History   First MD Initiated Contact with Patient 09/06/13 1827     Chief Complaint  Patient presents with  . Abdominal Pain    HPI Comments: 48 yo F hx of IBS, endometriosis s/p hysterectomy, presents with CC of abdominal pain.  Pain began last PM.  Pain in LLQ, radiating to L back, described as constant, stabbing.  Associated chills, nausea, vomiting.  Denies fever, CP, SOB, diarrhea, constipation, dysuria, vaginal symptoms, hematuria, myalgias, rash, or any other symptoms. Pt thought it was due to gas, and took Gas-X, and Levsin without relief.  She also took a Ketoprofen 2 hours ago, without relief.  Pain is currently 7/10.  Pt went to [facility] today and had KUB which was WNL, and UA which was WNL.  Pt is supposed to travel to [facility] tomorrow, and given concern for continued severe abdominal pain, pt was recommended to come to ED for further evaluation.    The history is provided by the patient. No language interpreter was used.    Past Medical History  Diagnosis Date  . IBS (irritable bowel syndrome)   . Anxiety   . Migraines   . Neuropathy   . Endometriosis   . Anemia   . Shoulder pain   . Brachial neuritis or radiculitis NOS   . Migraine, unspecified, without mention of intractable migraine without mention of status migrainosus    Past Surgical History  Procedure Laterality Date  . Abdominal hysterectomy    . Cholecystectomy    . Diagnostic laparoscopy    . Laparoscopy  11/17/2011    Procedure: LAPAROSCOPY OPERATIVE;  Surgeon: Darlyn Chamber, MD;  Location: [facility];  Service: Gynecology;  Laterality: N/A;  with Injection of Vaginal Cuff with Dexamethasone mixed in 0.5% Bupivacaine  . Cystoscopy  11/17/2011    Procedure: CYSTOSCOPY;  Surgeon: Darlyn Chamber, MD;  Location: [facility];  Service: Gynecology;;   Family History  Problem Relation Age of Onset  . Diabetes Mother     both sides grandparents  . Hypertension Mother   .  Colon polyps Mother   . Thyroid disease Father   . Colon polyps Father    History  Substance Use Topics  . Smoking status: Never Smoker   . Smokeless tobacco: Never Used  . Alcohol Use: No   OB History   Grav Para Term Preterm Abortions TAB SAB Ect Mult Living   4    2 2    2      Review of Systems  Constitutional: Negative for fever and chills.  Respiratory: Negative for cough and shortness of breath.   Cardiovascular: Negative for chest pain, palpitations and leg swelling.  Gastrointestinal: Positive for nausea, vomiting and abdominal pain. Negative for diarrhea, abdominal distention and anal bleeding.  Genitourinary: Positive for flank pain. Negative for dysuria, hematuria, vaginal bleeding, vaginal discharge, vaginal pain and pelvic pain.  Musculoskeletal: Negative for myalgias.  Skin: Negative for rash.  Neurological: Negative for dizziness, weakness, light-headedness, numbness and headaches.  Hematological: Negative for adenopathy. Does not bruise/bleed easily.  All other systems reviewed and are negative.      Allergies  Tylox and Codeine  Home Medications   Current Outpatient Rx  Name  Route  Sig  Dispense  Refill  . cetirizine (ZYRTEC) 10 MG tablet   Oral   Take 10 mg by mouth daily.         . DULoxetine (CYMBALTA)  60 MG capsule   Oral   Take 1 capsule (60 mg total) by mouth daily.   30 capsule   12   . hyoscyamine (LEVSIN SL) 0.125 MG SL tablet      Dissolve 1 tablet by mouth under the tongue every 4-6 hours as needed   30 tablet   2   . ketoprofen (ORUDIS) 75 MG capsule   Oral   Take 75 mg by mouth 2 (two) times daily as needed for mild pain.         . rizatriptan (MAXALT) 10 MG tablet   Oral   Take 1 tablet (10 mg total) by mouth as needed. May repeat in 2 hours if needed   15 tablet   12   . spironolactone (ALDACTONE) 25 MG tablet   Oral   Take 25 mg by mouth daily.         Marland Kitchen topiramate (TOPAMAX) 100 MG tablet   Oral   Take 100  mg by mouth 2 (two) times daily.         Marland Kitchen EXPIRED: simethicone (GAS-X) 80 MG chewable tablet   Oral   Chew 1 tablet (80 mg total) by mouth every 6 (six) hours as needed for flatulence.   30 tablet   0    BP 125/80  Pulse 86  Temp(Src) 98.3 F (36.8 C) (Oral)  Resp 18  SpO2 100% Physical Exam  Nursing note and vitals reviewed. Constitutional: She is oriented to person, place, and time. She appears well-developed and well-nourished.  HENT:  Head: Normocephalic and atraumatic.  Right Ear: External ear normal.  Left Ear: External ear normal.  Nose: Nose normal.  Mouth/Throat: Oropharynx is clear and moist.  Eyes: Conjunctivae and EOM are normal. Pupils are equal, round, and reactive to light.  Neck: Normal range of motion. Neck supple.  Cardiovascular: Normal rate, regular rhythm, normal heart sounds and intact distal pulses.   Pulmonary/Chest: Effort normal and breath sounds normal. No respiratory distress. She has no wheezes. She has no rales. She exhibits no tenderness.  Abdominal: Soft. Bowel sounds are normal. She exhibits no distension and no mass. There is tenderness. There is no rebound and no guarding.  Suprapubic, LLQ TTP, with mild TTP of LUQ.  Soft, no rebound, no guarding.    Musculoskeletal: Normal range of motion.  No CVA TTP.  Neurological: She is alert and oriented to person, place, and time.  Skin: Skin is warm and dry.    ED Course  Procedures (including critical care time) Labs Review Labs Reviewed  CBC WITH DIFFERENTIAL - Abnormal; Notable for the following:    Hemoglobin 15.3 (*)    All other components within normal limits  COMPREHENSIVE METABOLIC PANEL - Abnormal; Notable for the following:    Glucose, Bld 104 (*)    All other components within normal limits  LIPASE, BLOOD   Imaging Review Dg Abd 1 View  09/06/2013   CLINICAL DATA Abdominal pain  EXAM ABDOMEN - 1 VIEW  COMPARISON 11/20/2011  FINDINGS The bowel gas pattern is normal. No  radio-opaque calculi or other significant radiographic abnormality are seen.  IMPRESSION Negative.  SIGNATURE  Electronically Signed   By: Franchot Gallo M.D.   On: 09/06/2013 13:28   Ct Abdomen Pelvis W Contrast  09/06/2013   CLINICAL DATA Nausea and vomiting.  Abdominal pain.  EXAM CT ABDOMEN AND PELVIS WITH CONTRAST  TECHNIQUE Multidetector CT imaging of the abdomen and pelvis was performed using the standard protocol  following bolus administration of intravenous contrast.  CONTRAST 171mL OMNIPAQUE IOHEXOL 300 MG/ML  SOLN  COMPARISON DG ABD 1 VIEW dated 09/06/2013  FINDINGS Lung Bases: Dependent atelectasis. Mild respiratory motion artifact.  Liver:  Normal.  Spleen:  Normal.  Gallbladder:  Cholecystectomy.  Common bile duct: Postcholecystectomy dilation of the common bile duct. Postcholecystectomy mild intrahepatic biliary ductal dilation noted.  Pancreas:  Normal.  Adrenal glands:  Normal bilaterally.  Kidneys: Normal enhancement and delayed excretion of contrast. The ureters appear within normal limits.  Stomach:  Normal.  Small bowel:  Normal.  No obstruction or mesenteric adenopathy.  Colon: No right lower quadrant inflammatory changes. Probable normal appendix in the retrocecal position.  Pelvic Genitourinary: Moderate amount of fluid is present in the anatomic pelvis which appears simple. There is heterogeneous mass effect in the region of the left adnexa, probably representing cluster of hemorrhagic ovarian cysts. With the presence of moderate free fluid, this may represent rupture of an ovarian cyst. Small amount of free fluid extends into the right anatomic pelvis. The uterus is surgically absent.  Bones:  No aggressive osseous lesions.  Vasculature: Normal.  Body Wall: Fat containing periumbilical hernia.  IMPRESSION 1. Moderate amount of simple free fluid in the anatomic pelvis with poor definition of the left ovary and heterogeneous material in the left adnexa. This probably represents a ruptured  ovarian cyst. 2. Hysterectomy and cholecystectomy.  SIGNATURE  Electronically Signed   By: Dereck Ligas M.D.   On: 09/06/2013 20:53     EKG Interpretation None      MDM   Final diagnoses:  None   48 yo F hx of IBS, endometriosis s/p hysterectomy, presents with CC of abdominal pain.   Filed Vitals:   09/06/13 1444  BP: 125/80  Pulse: 86  Temp: 98.3 F (36.8 C)  Resp: 18   Physical exam as above.  VS WNL.  Pt with LLQ abdominal pain, however abdomen soft, nondistended, nonperitonitic.  Labs including CBC, CMP, Lipase are all WNL.  UCC UA negative for infection.  KUB negative for acute findings.  CT abdomen/pelvis obtained which shows moderate simple free fluid in pelvis, with findings suggestive of ruptured ovarian cyst.  Diagnosis of ruptured ovarian cyst.  Pt hemodynamically stable. Pt given morphine, Norco in ED with significant improvement in symptoms.  Pt to be d/c home in good condition.  Encouraged to continue supportive care.  Rx Norco, Zofran.  F/u with OB/Gyn in 1 week.  Return precautions given.  Pt understands and agrees with plan.  I have discussed pt's care plan with Dr. Angelina Pih.  Sinda Du, MD      Sinda Du, MD 09/07/13 [phone]

## 2013-09-06 NOTE — ED Notes (Signed)
Pt finished drinking CT contrast.  CT made aware

## 2013-09-06 NOTE — ED Notes (Signed)
Pt  Advised  To  Remain npo

## 2013-09-06 NOTE — Discharge Instructions (Signed)
Ovarian Cyst An ovarian cyst is a sac filled with fluid or blood. This sac is attached to the ovary. Some cysts go away on their own. Other cysts need treatment.  HOME CARE   Only take medicine as told by your doctor.  Follow up with your doctor as told.  Get regular pelvic exams and Pap tests. GET HELP IF:  Your periods are late, not regular, or painful.  You stop having periods.  Your belly (abdominal) or pelvic pain does not go away.  Your belly becomes large or puffy (swollen).  You have a hard time peeing (totally emptying your bladder).  You have pressure on your bladder.  You have pain during sex.  You feel fullness, pressure, or discomfort in your belly.  You lose weight for no reason.  You feel sick most of the time.  You have a hard time pooping (constipation).  You do not feel like eating.  You develop pimples (acne).  You have an increase in hair on your body and face.  You are gaining weight for no reason.  You think you are pregnant. GET HELP RIGHT AWAY IF:   Your belly pain gets worse.  You feel sick to your stomach (nauseous), and you throw up (vomit).  You have a fever that comes on fast.  You have belly pain while pooping (bowel movement).  Your periods are heavier than usual. MAKE SURE YOU:   Understand these instructions.  Will watch your condition.  Will get help right away if you are not doing well or get worse. Document Released: 12/02/2007 Document Revised: 04/05/2013 Document Reviewed: 02/20/2013 [facility] Patient Information 2014 [place].

## 2013-09-06 NOTE — ED Notes (Signed)
Pt c/o left lower abd pain that has been constant since yesterday afternoon. Was at urgent earlier today and sent here for further evaluation. sts some nausea and vomited x 1. denies diarrhea/fever/vaginal d/c/ dysuria. sts she has had some chills. Pt appears pale. Nad, skin warm and dry, resp e/u.

## 2013-09-06 NOTE — ED Provider Notes (Signed)
Medical screening examination/treatment/procedure(s) were performed by a resident physician or non-physician practitioner and as the supervising physician I was immediately available for consultation/collaboration.  Lynne Leader, MD    Gregor Hams, MD 09/06/13 2120

## 2013-09-07 NOTE — ED Provider Notes (Signed)
Medical screening examination/treatment/procedure(s) were conducted as a shared visit with non-physician practitioner(s) or resident  and myself.  I personally evaluated the patient during the encounter and agree with the findings and plan unless otherwise indicated.    I have personally reviewed any xrays and/ or EKG's with the provider and I agree with interpretation.   Acute onset left lower abdo pain.  Partial hysterectomy hx. Hx of cyst. No stone hx.  CT for further detail.  Exam mild LLQ pain.   CT likely ruptured cyst.  Pt well appearing in the ER, no guarding, patient waited to be seen with ER waiting room busy this evening.  Fup outpt.  Pain improved in ED. Dg Abd 1 View  09/06/2013   CLINICAL DATA Abdominal pain  EXAM ABDOMEN - 1 VIEW  COMPARISON 11/20/2011  FINDINGS The bowel gas pattern is normal. No radio-opaque calculi or other significant radiographic abnormality are seen.  IMPRESSION Negative.  SIGNATURE  Electronically Signed   By: Franchot Gallo M.D.   On: 09/06/2013 13:28   Ct Abdomen Pelvis W Contrast  09/06/2013   CLINICAL DATA Nausea and vomiting.  Abdominal pain.  EXAM CT ABDOMEN AND PELVIS WITH CONTRAST  TECHNIQUE Multidetector CT imaging of the abdomen and pelvis was performed using the standard protocol following bolus administration of intravenous contrast.  CONTRAST 174mL OMNIPAQUE IOHEXOL 300 MG/ML  SOLN  COMPARISON DG ABD 1 VIEW dated 09/06/2013  FINDINGS Lung Bases: Dependent atelectasis. Mild respiratory motion artifact.  Liver:  Normal.  Spleen:  Normal.  Gallbladder:  Cholecystectomy.  Common bile duct: Postcholecystectomy dilation of the common bile duct. Postcholecystectomy mild intrahepatic biliary ductal dilation noted.  Pancreas:  Normal.  Adrenal glands:  Normal bilaterally.  Kidneys: Normal enhancement and delayed excretion of contrast. The ureters appear within normal limits.  Stomach:  Normal.  Small bowel:  Normal.  No obstruction or mesenteric adenopathy.   Colon: No right lower quadrant inflammatory changes. Probable normal appendix in the retrocecal position.  Pelvic Genitourinary: Moderate amount of fluid is present in the anatomic pelvis which appears simple. There is heterogeneous mass effect in the region of the left adnexa, probably representing cluster of hemorrhagic ovarian cysts. With the presence of moderate free fluid, this may represent rupture of an ovarian cyst. Small amount of free fluid extends into the right anatomic pelvis. The uterus is surgically absent.  Bones:  No aggressive osseous lesions.  Vasculature: Normal.  Body Wall: Fat containing periumbilical hernia.  IMPRESSION 1. Moderate amount of simple free fluid in the anatomic pelvis with poor definition of the left ovary and heterogeneous material in the left adnexa. This probably represents a ruptured ovarian cyst. 2. Hysterectomy and cholecystectomy.  SIGNATURE  Electronically Signed   By: Dereck Ligas M.D.   On: 09/06/2013 20:53    Mariea Clonts, MD 09/07/13 [phone]

## 2013-12-18 ENCOUNTER — Other Ambulatory Visit: Payer: Self-pay | Admitting: Neurology

## 2013-12-20 ENCOUNTER — Telehealth: Payer: Self-pay

## 2013-12-20 NOTE — Telephone Encounter (Signed)
Last OV says patient is to take Topamax 100mg  twice daily, however, patient has been taking 100mg  in am and 200mg  in pm.  Okay to update Rx to this dose?  Please advise.  Thank you.

## 2013-12-21 NOTE — Telephone Encounter (Signed)
Rx has been updated and sent.   

## 2013-12-21 NOTE — Telephone Encounter (Signed)
[name], it is okay to upgrade her medication prescription

## 2013-12-25 ENCOUNTER — Other Ambulatory Visit: Payer: Self-pay | Admitting: Neurology

## 2014-01-22 ENCOUNTER — Other Ambulatory Visit: Payer: Self-pay | Admitting: Neurology

## 2014-02-09 ENCOUNTER — Other Ambulatory Visit: Payer: Self-pay | Admitting: Neurology

## 2014-02-18 ENCOUNTER — Other Ambulatory Visit: Payer: Self-pay | Admitting: Neurology

## 2014-02-19 ENCOUNTER — Telehealth: Payer: Self-pay | Admitting: Neurology

## 2014-02-19 NOTE — Telephone Encounter (Signed)
Patient requesting an appointment in order to medications refilled.  Please call and advise, if no answer on home, call cell @ [phone].

## 2014-02-20 NOTE — Telephone Encounter (Signed)
Called and spoke to patient. Dr.Yan had a cancellation she will follow with Dr.Yan 02-21-2014.

## 2014-02-21 ENCOUNTER — Encounter: Payer: Self-pay | Admitting: Neurology

## 2014-02-21 ENCOUNTER — Ambulatory Visit (INDEPENDENT_AMBULATORY_CARE_PROVIDER_SITE_OTHER): Payer: BC Managed Care – PPO | Admitting: Neurology

## 2014-02-21 VITALS — BP 111/76 | HR 62 | Ht 67.0 in | Wt 157.0 lb

## 2014-02-21 DIAGNOSIS — G43909 Migraine, unspecified, not intractable, without status migrainosus: Secondary | ICD-10-CM

## 2014-02-21 MED ORDER — TOPIRAMATE 100 MG PO TABS: 100.0000 mg | ORAL_TABLET | Freq: Two times a day (BID) | ORAL | Status: DC

## 2014-02-21 MED ORDER — DICLOFENAC SODIUM 50 MG PO TBEC: 50.0000 mg | DELAYED_RELEASE_TABLET | ORAL | Status: DC | PRN

## 2014-02-21 MED ORDER — RIZATRIPTAN BENZOATE 10 MG PO TABS: 10.0000 mg | ORAL_TABLET | ORAL | Status: DC | PRN

## 2014-02-21 MED ORDER — DULOXETINE HCL 60 MG PO CPEP: 60.0000 mg | ORAL_CAPSULE | Freq: Every day | ORAL | Status: DC

## 2014-02-21 NOTE — Progress Notes (Signed)
History of Present Illness  48 year old right-handed white married female with a history of headaches followed by Dr. Erling Cruz.  Her headache beginning in childhood since age 45, and changing in pattern since 2009. The headaches are always on the left side, unrelated to her menstrual cycle, and not associated with visual disturbance or nausea and vomiting. but has a history of endometriosis.   The headaches last intermitently for 5 days and are improved with Ketoprofen, Maxalt therapy, and coffee. Her daughter has been to the headache clinic and uses bio freeze to the scalp as well as Maxalt. [name], son, grandmother, and mother have migraine.   The patient has tried food restrictions without success. She is on topirimate 50 mg in the morning and 100 mg at night without significant side effects. She has word finding difficulties and confusion that have not been serious.   She averages 3 migraine headache days per month. These are throbbing without associated nausea and vomiting made worse by smells such as Vanilla, and can have an ice pick quality. Maxalt and ketoprofen are effective almost 100% of the time.   She also has 4 headaches per week. which are not as severe  Her headaches can last 5 days in a row requiring steroids and currently she is on a tapering course of steroids for a one-month history of headaches.She has a two-year history of pain occurring in her shoulder blade region extending to her left arm along the inner aspect with discomfort in the second, third ,fourth, and fifth fingers of  the left hand. There is no definite numbness but she describes "tingling".   The  pain in the neck and symptoms in the  left arm are made worse by flexion of her neck. Her neck symptoms have improved since discontinuing exercise programs. She reports the onset of symptoms in her right leg with pain extending to the toes of her right foot on the outer aspect of her right foot for one year.   This began following an exercise that she performed. Her daytime activities include going to the gym, cleaning the house and driving her children. She does not play golf or tennis She enjoys  "spinning" exercises with stationary bicycles.  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   MRI study of the brain without and with contrast 05/13/2006 which I reviewed  is normal.   Cervical spine x-rays 08/01/2010 which I reviewed are normal. MRI of the cervical spine 09/16/2010 shows  a shallow central  disc protrusion at C6-7 which is non compressive.  The C7-T1 level is not as well seen. There is a  right lateral pharyngeal recess finding  that is nonspecific.   This was evaluated by Dr. Erik Obey and found to  represent retention cysts. Lumbar MRI 09/29/2010  shows facet arthropathy at L2-3, L3-4, L4-5, and a  small annular rent eccentric to the left at L5-S1.She tolerates medications well without significant side effects.   She has irritable bowel syndrome gas, bloating followed by Dr. Olevia Perches and  currently treated with Xanax, hyoscyamine, and Align.  She underwent ovarian scar tissue surgery 11/17/11 with improvement in symptoms from her gynecologist She developed severe headaches that  were almost continuous 12/2010 and was evaluated in the office where she was treated with IV Depacon. CBC and CMP were unremarkable. She was given a tapering course of prednisone. She has received another prednisone taper since she was last here by her primary care physician. Since 8/18 when  she went to the beach and sat in foldout chairs, she has pain in her right back, swelling in her right foot and lower leg, pain with walking, and numbness in the lateral anterior shin and side of her foot. It is made worse sitting and decreased standing. There is no change with bending over coughing or sneezing.   She gave up exercising and  restarted gabapentin 100 twice a day and 300 at night. She has had significant  improvement since beginning Cymbalta 60 mg per day. She has not had side effects.  UPDATE June 3rd 2014.  She is now taking Cymbalta 90 each day, which helps her low back, leg pain better, she has 3-4 migraine headaches each month, Maxalt, ketoprofen has been very helpful. She denies significant side effect.  Topamax 100mg  am, 100mg  pm.  Her migraine is fine, seasonal, with thw eather changes, zyetrec.  UPDATE August 28th 2015:  she continues to have migraines, 3-4 times each month, Topamax 100 mg twice a day works well, Maxalt has been helpful, she wants refill her medications    PHYSICAL EXAMINATOINS:  Generalized: In no acute distress  Neck: Supple, no carotid bruits   Cardiac: Regular rate rhythm  Pulmonary: Clear to auscultation bilaterally  Musculoskeletal: No deformity  Neurological examination  Mentation: Alert oriented to time, place, history taking, and causual conversation  Cranial nerve II-XII: Pupils were equal round reactive to light extraocular movements were full, visual field were full on confrontational test. facial sensation and strength were normal. hearing was intact to finger rubbing bilaterally. Uvula tongue midline.  head turning and shoulder shrug and were normal and symmetric.Tongue protrusion into cheek strength was normal.  Motor: normal tone, bulk and strength.  Sensory: Intact to fine touch, pinprick, preserved vibratory sensation, and proprioception at toes.  Coordination: Normal finger to nose, heel-to-shin bilaterally there was no truncal ataxia  Gait: Rising up from seated position without assistance, normal stance, without trunk ataxia, moderate stride, good arm swing, smooth turning, able to perform tiptoe, and heel walking without difficulty.   Romberg signs: Negative  Deep tendon reflexes: Brachioradialis 2/2, biceps 2/2, triceps 2/2, patellar 2/2, Achilles 2/2, plantar responses were flexor bilaterally.  Assessment and plan:  68   right-handed Caucasian female, with history of chronic migraine headaches, chronic low back pain   I have refilled her Cymbalta 60 + 30 mg every day, Continue topiramate increase her dosage to 100 mg twice a day, Maxalt, diclofenac tablet as needed for migraine abortive treatment,  Return to clinic in one year

## 2014-02-23 ENCOUNTER — Other Ambulatory Visit: Payer: Self-pay | Admitting: Internal Medicine

## 2014-03-14 ENCOUNTER — Ambulatory Visit: Payer: BC Managed Care – PPO | Admitting: Sports Medicine

## 2014-04-04 ENCOUNTER — Other Ambulatory Visit: Payer: Self-pay | Admitting: Neurology

## 2014-04-30 ENCOUNTER — Encounter: Payer: Self-pay | Admitting: Neurology

## 2014-05-08 ENCOUNTER — Other Ambulatory Visit: Payer: Self-pay | Admitting: Obstetrics and Gynecology

## 2014-05-09 LAB — CYTOLOGY - PAP

## 2014-07-25 ENCOUNTER — Other Ambulatory Visit: Payer: Self-pay | Admitting: Internal Medicine

## 2014-10-02 ENCOUNTER — Other Ambulatory Visit: Payer: Self-pay | Admitting: Internal Medicine

## 2014-10-05 ENCOUNTER — Other Ambulatory Visit: Payer: Self-pay | Admitting: Neurology

## 2014-12-17 ENCOUNTER — Telehealth: Payer: Self-pay | Admitting: Neurology

## 2014-12-17 MED ORDER — DULOXETINE HCL 20 MG PO CPEP: ORAL_CAPSULE | ORAL | Status: DC

## 2014-12-17 NOTE — Telephone Encounter (Signed)
Patient called requesting to stop DULoxetine (CYMBALTA) 60 MG capsule. She is requesting 20mg  to be called in to pharmacy. Please call and advise. Patient can be reached at [phone].

## 2014-12-17 NOTE — Telephone Encounter (Signed)
Patient aware that new rx and has been sent to the pharmacy.  She verbalized understanding of tapering dose.

## 2014-12-17 NOTE — Telephone Encounter (Signed)
Says Dr. Erling Cruz had previously given her duloxetine 60mg  daily for pain.  She would like to discontinue it because she does not feel she needs it.  She tried stopping abruptly but had dizziness so she restarted the 60mg  daily again.  She would like to taper off slowly.  She is requesting a new, lower dose rx be sent to the pharmacy with instructions on how to stop it completely.

## 2014-12-17 NOTE — Telephone Encounter (Signed)
Please let patient know, I have written Cymbalta 20 mg tablets, 2 tablets daily for one week, one tablet daily, then stop.

## 2015-01-14 ENCOUNTER — Ambulatory Visit: Payer: Self-pay | Admitting: Family Medicine

## 2015-01-21 ENCOUNTER — Ambulatory Visit (INDEPENDENT_AMBULATORY_CARE_PROVIDER_SITE_OTHER): Payer: BLUE CROSS/BLUE SHIELD | Admitting: Family Medicine

## 2015-01-21 VITALS — Ht 68.0 in | Wt 158.0 lb

## 2015-01-21 DIAGNOSIS — G43809 Other migraine, not intractable, without status migrainosus: Secondary | ICD-10-CM

## 2015-01-21 DIAGNOSIS — M7711 Lateral epicondylitis, right elbow: Secondary | ICD-10-CM

## 2015-01-21 MED ORDER — NITROGLYCERIN 0.2 MG/HR TD PT24: MEDICATED_PATCH | TRANSDERMAL | Status: DC

## 2015-01-21 NOTE — Patient Instructions (Signed)
I want you to start icing your right elbow twice daily, 15-20 minutes at a time. I would recommend no tennis for the next 4 weeks at least. We are going to play shoe in an elbow sleeve. When you come back from the cruise, I wanted to try the nitroglycerin patch therapy protocol below. If the headaches are bad or if this starts triggering her migraines, please stop the patch at once and let us know.  Cut patch into one - fourth pieces Place a one fourth piece of patch on  skin over affected area, changing to a new piece every 24 hours.   You may experience a headache during the first 1-2 days and maybe up to 2 weeks of using the patch; these should improve and go away. If you experience headaches after beginning nitroglycerin patch treatment, you may take your preferred over the counter pain medicine such as tylenol or advil as directed on the box. Another side effect of the nitroglycerin patch can be skin irritation  or rash related to the patch adhesive.   Please notify our office if you develop  severe headaches or rash, and stop the patch.  Please call our office with any questions or problems. Tendon healing with nitroglycerin patch may require 12 to 24 weeks depending on the extent of injury. Men should not use if taking Viagra, Cialis, or Levitra as it may cause an unsafe lowering of the blood pressure..  Use with caution if you have migraines or rosacea.

## 2015-01-22 DIAGNOSIS — M7711 Lateral epicondylitis, right elbow: Secondary | ICD-10-CM | POA: Insufficient documentation

## 2015-01-22 NOTE — Assessment & Plan Note (Addendum)
Pretty significant ultrasound findings. I'm afraid if we don't shut her down totally, she will end up needing surgical intervention and I had a long discussion with her today about these issues.  Unfortunately she was very upset when I recommended that she play no tennis for the next 3-4 weeks (at least).    I would  like to put her in an elbow sleeve, use icing. I recommended nitroglycerin patch therapy. This may be complicated by her history of migraines and we discussed that. Should she develop migraine or increased frequency then we would need to discontinue nitroglycerin. She's getting ready to go on a cruise so this may also complicate treatment so ultimately she decided to go ahead and start glycerin patch tonight. ICE BID, rtc 3-4 weeks. Call with problems. No tennis or other activities similar.  She is also complaining of some similar but much less severe symptoms in her left elbow. I suspect she has been offloading some activities to the left arm. Right now we'll just follow that and treat the right elbow. I don't see any signs of joint warmth or swelling that would make me think this is some type of rheumatological issue at this time.  At some point she might benefit from a corticosteroid-induced in but I do not want to do that today because I think it will relieve a lot of her symptoms and then I don't think she'll discontinue her activity. I told her this may likely take 2-3 months to get fully resolved. I don't think she'll be out of tennis that whole time but is possible. I did spent greater than 50% of my 35  minute office visit in counseling education regarding these issues as she was understandably very upset about the diagnosis and ramifications.

## 2015-01-22 NOTE — Progress Notes (Signed)
Patient ID: [name], female   DOB: [birth year], 49 y.o.   MRN: [id number]  [name] - 49 y.o. female MRN [id number]  Date of birth: 1966-02-16    SUBJECTIVE:     Right-hand dominant female with complaint of bilateral but significantly right greater than left elbow pain. She plays tennis daily. Has been having pain for about the last month. In the last 1-2 weeks it is so severe it is keeping her from doing other activities of daily living. Pain with turning doorknobs, picking up things, driving. She is taking tennis lessons to improve her gain and has been working very hard recently. Pain is on the lateral portion and inside the center of the elbow joint. She's noted no swelling or erythema of the joint. She has similar but much less severe issues with the left elbow. She has tried stretching without much relief. ROS:     No other unusual joint pains or muscle pains. She's noted no swelling or erythema of either elbow joint. She's had no numbness or tingling in her hand. Denies fever, sweats, chills, unusual weight change.  PERTINENT  PMH / PSH FH / / SH:  Past Medical, Surgical, Social, and Family History Reviewed & Updated in the EMR.  Pertinent findings include:  History of migraines, on Topamax History of cervical disc disorder. No personal history diabetes mellitus. No history of specific elbow injury and no history of elbow surgery.  Pertinent  IMAGING history: The following data is from a neurology note, the actual imaging reports are not available in my current electronic medical record. Per Dr. Rhea Belton note she had: Cervical spine x-rays 2012 normal, MRI cervical spine 2012 noncompressive shallow central disc protrusion at C6-7. OBJECTIVE: Ht 5\' 8"  (1.727 m)  Wt 158 lb (71.668 kg)  BMI 24.03 kg/m2  Physical Exam:  Vital signs are reviewed. GEN.: Well-developed female no acute distress ELBOW is: Bilaterally symmetrical without any erythema, warmth, swelling or defect. Right  elbow: Tender to palpation over the common extensor tendon area and this reproduces her pain. She has full range of motion flexion extension and supination. She has pain with full extension as well as supination and pronation. MSK: Intact strength right bicep&  tricep, wrist extension flexion, grip. VASCULAR: Radial pulses 2+ bilaterally equal. Hands are warm and have normal color. NEURO: Intact sensation to soft touch bilateral hands.  ULTRASOUND:: Right elbow. The common extensor tendon area is well seen. There is significantly increased neovascular activity all along the attachment to the lateral epicondyles of the humerus.. There's quite a bit of muscle edema in the common extensor muscles. I see no specific tear or defect.  ASSESSMENT & PLAN:  See problem based charting & AVS for pt instructions.

## 2015-02-10 ENCOUNTER — Other Ambulatory Visit: Payer: Self-pay | Admitting: Neurology

## 2015-02-12 ENCOUNTER — Ambulatory Visit (INDEPENDENT_AMBULATORY_CARE_PROVIDER_SITE_OTHER): Payer: BLUE CROSS/BLUE SHIELD | Admitting: Adult Health

## 2015-02-12 ENCOUNTER — Encounter: Payer: Self-pay | Admitting: Adult Health

## 2015-02-12 VITALS — BP 115/76 | HR 62 | Ht 68.0 in | Wt 158.0 lb

## 2015-02-12 DIAGNOSIS — M545 Low back pain: Secondary | ICD-10-CM | POA: Diagnosis not present

## 2015-02-12 DIAGNOSIS — M79604 Pain in right leg: Secondary | ICD-10-CM

## 2015-02-12 DIAGNOSIS — G43009 Migraine without aura, not intractable, without status migrainosus: Secondary | ICD-10-CM | POA: Diagnosis not present

## 2015-02-12 MED ORDER — DULOXETINE HCL 60 MG PO CPEP: 60.0000 mg | ORAL_CAPSULE | Freq: Every day | ORAL | Status: DC

## 2015-02-12 NOTE — Progress Notes (Signed)
PATIENT: [name] DOB: 1966/02/26  REASON FOR VISIT: follow up- migraine headache, low back pain HISTORY FROM: patient  HISTORY OF PRESENT ILLNESS: Ms. [name] is a 49 year old female with a history of migraine headaches. She returns today for an evaluation. She is currently taken Topamax 100 mg twice a day. She reports that she continues to have 3-4 headaches a month. This is unchanged since the last visit. Her headaches are normally located on the right side of the head and across the maxillary sinuses. She denies photophobia and phonophobia. Denies vomiting but will occasionally have nausea. She states that smell is a trigger for her headaches. The patient was on Cymbalta for low back pain that radiates into the right leg. This is working well for the patient however she states that she went off the medication just to see what it was like off of the medication. She states that she did not want to be dependent on medication. However her symptoms have returned. She would like to restart the Cymbalta. She denies any new neurological symptoms. She returns today for an evaluation.  HISTORY 02/21/14 [name]): 49 year old right-handed white married female with a history of headaches followed by Dr. Erling Cruz.  Her headache beginning in childhood since age 64, and changing in pattern since 2009. The headaches are always on the left side, unrelated to her menstrual cycle, and not associated with visual disturbance or nausea and vomiting. but has a history of endometriosis.   The headaches last intermitently for 5 days and are improved with Ketoprofen, Maxalt therapy, and coffee. Her daughter has been to the headache clinic and uses bio freeze to the scalp as well as Maxalt. [name], son, grandmother, and mother have migraine.   The patient has tried food restrictions without success. She is on topirimate 50 mg in the morning and 100 mg at night without significant side effects. She has  word finding difficulties and confusion that have not been serious.   She averages 3 migraine headache days per month. These are throbbing without associated nausea and vomiting made worse by smells such as Vanilla, and can have an ice pick quality. Maxalt and ketoprofen are effective almost 100% of the time.  She also has 4 headaches per week. which are not as severe Her headaches can last 5 days in a row requiring steroids and currently she is on a tapering course of steroids for a one-month history of headaches.She has a two-year history of pain occurring in her shoulder blade region extending to her left arm along the inner aspect with discomfort in the second, third ,fourth, and fifth fingers of the left hand. There is no definite numbness but she describes "tingling".   The pain in the neck and symptoms in the left arm are made worse by flexion of her neck. Her neck symptoms have improved since discontinuing exercise programs. She reports the onset of symptoms in her right leg with pain extending to the toes of her right foot on the outer aspect of her right foot for one year.  This began following an exercise that she performed. Her daytime activities include going to the gym, cleaning the house and driving her children. She does not play golf or tennis She enjoys "spinning" exercises with stationary bicycles. 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   MRI study of the brain without and with contrast 05/13/2006 which I reviewed is normal.   Cervical spine x-rays 08/01/2010 which I reviewed  are normal. MRI of the cervical spine 09/16/2010 shows a shallow central disc protrusion at C6-7 which is non compressive. The C7-T1 level is not as well seen. There is a right lateral pharyngeal recess finding that is nonspecific.   This was evaluated by Dr. 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 Lumbar MRI 09/29/2010 shows facet arthropathy at L2-3, L3-4, L4-5,  and a small annular rent eccentric to the left at L5-S1.She tolerates medications well without significant side effects.   She has irritable bowel syndrome gas, bloating followed by Dr. Olevia Perches and currently treated with Xanax, hyoscyamine, and Align.  She underwent ovarian scar tissue surgery 11/17/11 with improvement in symptoms from her gynecologist She developed severe headaches that were almost continuous 12/2010 and was evaluated in the office where she was treated with IV Depacon. CBC and CMP were unremarkable. She was given a tapering course of prednisone. She has received another prednisone taper since she was last here by her primary care physician. Since 8/18 when she went to the beach and sat in foldout chairs, she has pain in her right back, swelling in her right foot and lower leg, pain with walking, and numbness in the lateral anterior shin and side of her foot. It is made worse sitting and decreased standing. There is no change with bending over coughing or sneezing.   She gave up exercising and restarted gabapentin 100 twice a day and 300 at night. She has had significant improvement since beginning Cymbalta 60 mg per day. She has not had side effects.  UPDATE June 3rd 2014.  She is now taking Cymbalta 90 each day, which helps her low back, leg pain better, she has 3-4 migraine headaches each month, Maxalt, ketoprofen has been very helpful. She denies significant side effect.  Topamax 100mg  am, 100mg  pm.  Her migraine is fine, seasonal, with thw eather changes, zyetrec.  UPDATE August 28th 2015: she continues to have migraines, 3-4 times each month, Topamax 100 mg twice a day works well, Maxalt has been helpful, she wants refill her medications  REVIEW OF SYSTEMS: Out of a complete 14 system review of symptoms, the patient complains only of the following symptoms, and all other reviewed systems are negative.  Unexpected weight change, headache, restless leg, frequent  waking  ALLERGIES: Allergies  Allergen Reactions  . Tylox [Oxycodone-Acetaminophen] Itching  . Codeine     nausea    HOME MEDICATIONS: Outpatient Prescriptions Prior to Visit  Medication Sig Dispense Refill  . ALPRAZolam (XANAX) 0.5 MG tablet TAKE 1 TABLET AT BEDTIME AS NEEDED 30 tablet 0  . Calcium Carbonate Antacid (CALCIUM ANTACID PO) Take by mouth daily.    . cetirizine (ZYRTEC) 10 MG tablet Take 10 mg by mouth daily.    . Cyanocobalamin (VITAMIN B 12 PO) Take by mouth daily.    . diclofenac (VOLTAREN) 50 MG EC tablet TAKE 1 TABLET BY MOUTH ONCE DAILY AS NEEDED FOR HEADACHE, *DONT EXCEED 2TABS/24HRS* 30 tablet 0  . DULoxetine (CYMBALTA) 20 MG capsule 2 tabs po qday xone week, then one tab po qday 21 capsule 0  . DULoxetine (CYMBALTA) 60 MG capsule Take 1 capsule (60 mg total) by mouth daily. 30 capsule 11  . hyoscyamine (LEVSIN SL) 0.125 MG SL tablet DISSOLVE 1 TABLET BY MOUTH UNDER THE TONGUE EVERY 4-6 HOURS AS NEEDED 30 tablet 0  . LINZESS 290 MCG CAPS capsule TAKE 1 CAPSULE BY MOUTH ONCE DAILY 30 capsule 1  . nitroGLYCERIN (NITRODUR - DOSED IN MG/24 HR)  0.2 mg/hr patch Cut patch into one - fourth pieces Place a one fourth piece of patch on  skin over affected area, changing to a new piece every 24 hours. 10 patch 1  . Omega-3 Fatty Acids (FISH OIL) 1200 MG CAPS Take by mouth daily.    . predniSONE (DELTASONE) 10 MG tablet TAKE AS DIRECTED WITH FOOD OR MILK * 3 TABS DAILY FOR 3 DAYS, 2 TABS FOR 3 DAYS, 1 TAB FOR 5 DAYS  3  . rizatriptan (MAXALT) 10 MG tablet Take 1 tablet (10 mg total) by mouth as needed for migraine. May repeat in 2 hours if needed 15 tablet 11  . simethicone (GAS-X) 80 MG chewable tablet Chew 1 tablet (80 mg total) by mouth every 6 (six) hours as needed for flatulence. 30 tablet 0  . spironolactone (ALDACTONE) 25 MG tablet Take 25 mg by mouth daily.    Marland Kitchen topiramate (TOPAMAX) 100 MG tablet Take 1 tablet (100 mg total) by mouth 2 (two) times daily. 60 tablet 11  .  topiramate (TOPAMAX) 100 MG tablet Take 1 tablet (100 mg total) by mouth 2 (two) times daily. 180 tablet 3  . tretinoin (RETIN-A) 0.025 % cream APPLY TO AFFECTED AREA ON FACE ONCE DAILY AT BEDTIME  11   No facility-administered medications prior to visit.    PAST MEDICAL HISTORY: Past Medical History  Diagnosis Date  . IBS (irritable bowel syndrome)   . Anxiety   . Migraines   . Neuropathy   . Endometriosis   . Anemia   . Shoulder pain   . Brachial neuritis or radiculitis NOS   . Migraine, unspecified, without mention of intractable migraine without mention of status migrainosus   . H/O: hysterectomy     PAST SURGICAL HISTORY: Past Surgical History  Procedure Laterality Date  . Abdominal hysterectomy    . Cholecystectomy    . Diagnostic laparoscopy    . Laparoscopy  11/17/2011    Procedure: LAPAROSCOPY OPERATIVE;  Surgeon: Darlyn Chamber, MD;  Location: [facility];  Service: Gynecology;  Laterality: N/A;  with Injection of Vaginal Cuff with Dexamethasone mixed in 0.5% Bupivacaine  . Cystoscopy  11/17/2011    Procedure: CYSTOSCOPY;  Surgeon: Darlyn Chamber, MD;  Location: [facility];  Service: Gynecology;;    FAMILY HISTORY: Family History  Problem Relation Age of Onset  . Diabetes Mother     both sides grandparents  . Hypertension Mother   . Colon polyps Mother   . Thyroid disease Father   . Colon polyps Father     SOCIAL HISTORY: Social History   Social History  . Marital Status: Married    Spouse Name: [name]  . Number of Children: 2  . Years of Education: 2-degree   Occupational History  . stay at home    Social History Main Topics  . Smoking status: Never Smoker   . Smokeless tobacco: Never Used  . Alcohol Use: No  . Drug Use: No  . Sexual Activity: Yes    Birth Control/ Protection: Surgical   Other Topics Concern  . Not on file   Social History Narrative   Patient lives at home with her husband [name]. Drinks three cups of caffeine daily. Patient has two  years of college education.       PHYSICAL EXAM  Filed Vitals:   02/12/15 1037  Height: 5\' 8"  (1.727 m)  Weight: 158 lb (71.668 kg)   Body mass index is 24.03 kg/(m^2).  Generalized: Well developed,  in no acute distress   Neurological examination  Mentation: Alert oriented to time, place, history taking. Follows all commands speech and language fluent Cranial nerve II-XII: Pupils were equal round reactive to light. Extraocular movements were full, visual field were full on confrontational test. Facial sensation and strength were normal. Uvula tongue midline. Head turning and shoulder shrug  were normal and symmetric. Motor: The motor testing reveals 5 over 5 strength of all 4 extremities. Good symmetric motor tone is noted throughout.  Sensory: Sensory testing is intact to soft touch on all 4 extremities. No evidence of extinction is noted.  Coordination: Cerebellar testing reveals good finger-nose-finger and heel-to-shin bilaterally.  Gait and station: Gait is normal. Tandem gait is normal. Romberg is negative. No drift is seen.  Reflexes: Deep tendon reflexes are symmetric and normal bilaterally.   DIAGNOSTIC DATA (LABS, IMAGING, TESTING) - I reviewed patient records, labs, notes, testing and imaging myself where available.  ASSESSMENT AND PLAN 49 y.o. year old female  has a past medical history of IBS (irritable bowel syndrome); Anxiety; Migraines; Neuropathy; Endometriosis; Anemia; Shoulder pain; Brachial neuritis or radiculitis NOS; Migraine, unspecified, without mention of intractable migraine without mention of status migrainosus; and H/O: hysterectomy. here with:  1. Migraine headache 2. Low back pain radiating to legs  Overall the patient is doing well. She will continue on Topamax 100 mg twice a day. I will restart the patient on Cymbalta 60 mg daily. Patient advised that if her symptoms worsen or she develops new symptoms she should let us know. She will follow-up in one  year or sooner if needed.    Ward Givens, MSN, NP-C 02/12/2015, 10:36 AM [facility] [address] [place], [place] [address] [phone]  Note: This document was prepared with digital dictation and possible smart phrase technology. Any transcriptional errors that result from this process are unintentional.

## 2015-02-12 NOTE — Patient Instructions (Signed)
Continue Topamax and Cymbalta. Refill sent today. If your symptoms worsen or you develop new symptoms please let us know.

## 2015-02-12 NOTE — Progress Notes (Signed)
I have reviewed and agreed above plan. 

## 2015-02-18 ENCOUNTER — Ambulatory Visit (INDEPENDENT_AMBULATORY_CARE_PROVIDER_SITE_OTHER): Payer: BLUE CROSS/BLUE SHIELD | Admitting: Family Medicine

## 2015-02-18 ENCOUNTER — Encounter: Payer: Self-pay | Admitting: Family Medicine

## 2015-02-18 VITALS — BP 114/66

## 2015-02-18 DIAGNOSIS — M7711 Lateral epicondylitis, right elbow: Secondary | ICD-10-CM

## 2015-02-18 MED ORDER — METHYLPREDNISOLONE ACETATE 40 MG/ML IJ SUSP
40.0000 mg | Freq: Once | INTRAMUSCULAR | Status: AC
  Administered 2015-02-18: 40 mg via INTRA_ARTICULAR

## 2015-02-18 NOTE — Assessment & Plan Note (Signed)
She was unable to tolerate the nitroglycerin patch, used it once. Has been icing, stretching, wearing the elbow sleeve and feels like her pain is about 30% better. Would like to try according to sterile injection which we did today. I gave her home exercise program for strengthening which she will start in one week and I will follow her up in 3 weeks.

## 2015-02-18 NOTE — Progress Notes (Signed)
Patient ID: [name], female   DOB: [birth year], 49 y.o.   MRN: [id number]  CC: Follow up for elbow pain  HPI: Right-hand dominant 49 yo female with complaint of bilateral but significantly right greater than left elbow pain x 2 months.  Seen here about 1 month ago and diagnosed with severe lateral epicondylitis of right elbow. She was given an elbow sleeve and asked to stop playing tennis, which she was doing daily before.  Also given nitro patches for this but notes that she tried this 1 time and was awoken out of sleep by a severe "migraine HA" She has not tried this again.  Pain has improved with decreasing her activity, however, she is still having daily pain with lifting, supinating and pronating like with turning doorknobs, picking up frying pan, driving.  Pain is on the lateral portion and inside the center of the elbow joint. She's noted no swelling or erythema of the joint. She has similar but much less severe issues with the left elbow. She has tried stretching without much relief. Pain is 6/10 today, 10/10 previously.   BP 114/66 mmHg  GEN.: Well-developed female no acute distress ELBOW: Bilaterally symmetrical without any erythema, warmth, swelling or defect. Right elbow: Tender to palpation over the common extensor tendon area and this reproduces her pain. She has full range of motion flexion extension and supination. She has pain with full extension as well as supination and pronation. Left elbow: mildly tender to palpation over same area.  MSK: Intact strength right bicep& tricep, wrist extension flexion, grip. Same with left. VASCULAR: Radial pulses 2+ bilaterally equal. Hands are warm and have normal color. NEURO: Intact sensation to soft touch bilateral hands.  Assessment:  Right-hand dominant 49 yo female with complaint of bilateral but significantly right greater than left elbow pain x 2 months, improving with compression and decrease in activity. Pt would like  injection today to help with discomfort and hopes of getting back to previous activities. Given improvement with rest, will try injection today.

## 2015-03-18 ENCOUNTER — Ambulatory Visit (INDEPENDENT_AMBULATORY_CARE_PROVIDER_SITE_OTHER): Payer: BLUE CROSS/BLUE SHIELD | Admitting: Family Medicine

## 2015-03-18 ENCOUNTER — Encounter: Payer: Self-pay | Admitting: Family Medicine

## 2015-03-18 ENCOUNTER — Other Ambulatory Visit: Payer: Self-pay | Admitting: Neurology

## 2015-03-18 VITALS — BP 131/83 | HR 61 | Ht 68.0 in | Wt 154.0 lb

## 2015-03-18 DIAGNOSIS — M7711 Lateral epicondylitis, right elbow: Secondary | ICD-10-CM

## 2015-03-18 NOTE — Progress Notes (Signed)
   HPI  CC: Right-sided lateral epicondylitis follow-up Patient is here for follow-up on her right lateral epicondylitis. She states that since her previous visit she has continued to avoid strenuous use of this region. She performs the strengthening and stretching exercises provided at a previous visit daily. Pain is significantly improved since her last visit ("80% better"), but still not fully resolved. Patient denies any setbacks, further injuries, trauma, erythema, weakness, numbness, paresthesias, or swelling.  Review of Systems   See HPI for ROS.  Objective: BP 131/83 mmHg  Pulse 61  Ht 5\' 8"  (1.727 m)  Wt 154 lb (69.854 kg)  BMI 23.42 kg/m2 Gen: NAD, alert, cooperative Elbow, right: Inspection normal with no visible erythema or swelling. ROM smooth and normal with good flexion and extension and ulnar/radial deviation. Some tenderness to palpation approximately 1 cm distal to the lateral epicondyle. Strength 5/5 in all directions with some reproducible pain to wrist extension against resistance and finger extension against resistance. Negative Finkelstein. Neuro: Alert and oriented, Speech clear, No gross deficits  Assessment and plan:  Lateral epicondylitis of right elbow Patient's right-sided lateral epicondylitis is significantly improved since prior visit. At this time I believe it is appropriate to have her begin returning to activities. We discussed slowly returning back to activity level at a rate of 10% of her prior activity. She is then to gauge her ability to increase this as she progresses, and if she begins to experience additional discomfort she was encouraged to back off temporarily. - Patient was asked to continue her home exercises/stretching. - Follow-up as needed     Elberta Leatherwood, MD,MS,  PGY2 03/18/2015 4:45 PM

## 2015-03-18 NOTE — Assessment & Plan Note (Signed)
Patient's right-sided lateral epicondylitis is significantly improved since prior visit. At this time I believe it is appropriate to have her begin returning to activities. We discussed slowly returning back to activity level at a rate of 10% of her prior activity. She is then to gauge her ability to increase this as she progresses, and if she begins to experience additional discomfort she was encouraged to back off temporarily. - Patient was asked to continue her home exercises/stretching. - Follow-up as needed

## 2015-03-19 NOTE — Progress Notes (Signed)
Patient ID: [name], female   DOB: Sep 15, 1965, 49 y.o.   MRN: [id number] [place] Attending Note: I have seen and examined this patient. I have discussed this patient with the resident and reviewed the assessment and plan as documented above. I agree with the resident's findings and plan. Right lateral epicondylitis that occurred after she began playing tennis regularly 1-2 hours a day. She did not tolerate the nitroglycerin patch but she has improved significantly with absolute rest from tenderness and a cortico-sterioid injection done at last office visit. I think she can return to regular activities. I spent a long time with her discussing gradual return and cross training, not necessarily doing the same activity day after day after day. She can follow-up when necessary. We would always be happy to see her back at this time I think she has 90-95% resolution of her symptoms.

## 2015-03-27 ENCOUNTER — Other Ambulatory Visit: Payer: Self-pay | Admitting: Neurology

## 2015-04-26 ENCOUNTER — Other Ambulatory Visit: Payer: Self-pay | Admitting: Neurology

## 2015-07-02 ENCOUNTER — Telehealth: Payer: Self-pay | Admitting: *Deleted

## 2015-07-02 NOTE — Telephone Encounter (Signed)
Patient requests refills of Linzess. Former Dr Olevia Perches patient last seen 09/2012. Last rx sent 09/23/14. Do you want me to fill or have her wait until her appointment with you on 08/26/15?

## 2015-07-04 ENCOUNTER — Other Ambulatory Visit: Payer: Self-pay | Admitting: Gastroenterology

## 2015-07-04 NOTE — Telephone Encounter (Signed)
Ok to give refills until appt

## 2015-07-04 NOTE — Telephone Encounter (Signed)
Dr Silverio Decamp refilled rx for patient.

## 2015-08-07 ENCOUNTER — Other Ambulatory Visit: Payer: Self-pay | Admitting: *Deleted

## 2015-08-07 MED ORDER — DULOXETINE HCL 60 MG PO CPEP: 60.0000 mg | ORAL_CAPSULE | Freq: Every day | ORAL | Status: DC

## 2015-08-26 ENCOUNTER — Ambulatory Visit (INDEPENDENT_AMBULATORY_CARE_PROVIDER_SITE_OTHER): Payer: BLUE CROSS/BLUE SHIELD | Admitting: Gastroenterology

## 2015-08-26 ENCOUNTER — Encounter: Payer: Self-pay | Admitting: Gastroenterology

## 2015-08-26 VITALS — BP 90/66 | HR 68 | Ht 67.5 in | Wt 155.5 lb

## 2015-08-26 DIAGNOSIS — K5900 Constipation, unspecified: Secondary | ICD-10-CM

## 2015-08-26 DIAGNOSIS — K589 Irritable bowel syndrome without diarrhea: Secondary | ICD-10-CM

## 2015-08-26 MED ORDER — LINACLOTIDE 145 MCG PO CAPS: 145.0000 ug | ORAL_CAPSULE | Freq: Every day | ORAL | Status: DC

## 2015-08-26 MED ORDER — HYOSCYAMINE SULFATE 0.125 MG SL SUBL: 0.1250 mg | SUBLINGUAL_TABLET | Freq: Four times a day (QID) | SUBLINGUAL | Status: DC | PRN

## 2015-08-26 NOTE — Patient Instructions (Signed)
We have sent Linzess 183mcg to your pharmacy reduce from 261mcg We also refilled your Levsin  Follow up in 6 months

## 2015-08-26 NOTE — Progress Notes (Signed)
[name]    LV:[id number]    [birth year]  Primary Care Physician:FULP, CAMMIE, MD  Referring Physician: No referring provider defined for this encounter.  Chief complaint: Constipation, IBS  HPI: 50 year old female with chronic history of constipation and irritable bowel syndrome previously followed by Dr. Olevia Jensen is here to establish care with me. She is currently taking Linzess 290 micrograms every fourth day to have a bowel movement . When she takes it she has multiple bowel movements for one to 2 hours the next morning. She also complained of intermittent bloating and cramping abdominal pain brought on with high-fiber diet with beans or broccoli which improves with Levsin and Beano She also has a history of endometriosis and underwent an open laparoscopy and removal of scar tissue and injectiosn of the vaginal cuff by Dr.McComb in May 2013. A GI workup consisted of a small bowel follow through in February 2013 which showed a normal transit time at 1 hour 35 minutes. The small bowel was located in the pelvis. Her last colonoscopy in 2014 was normal except for small internal hemorrhoids  Outpatient Encounter Prescriptions as of 08/26/2015  Medication Sig  . ALPRAZolam (XANAX) 0.5 MG tablet TAKE 1 TABLET AT BEDTIME AS NEEDED  . diclofenac (VOLTAREN) 50 MG EC tablet TAKE 1 TABLET BY MOUTH ONCE DAILY AS NEEDED FOR HEADACHE, *DONT EXCEED 2TABS/24HRS*  . DULoxetine (CYMBALTA) 60 MG capsule Take 1 capsule (60 mg total) by mouth daily.  . hyoscyamine (LEVSIN SL) 0.125 MG SL tablet DISSOLVE 1 TABLET BY MOUTH UNDER THE TONGUE EVERY 4-6 HOURS AS NEEDED  . rizatriptan (MAXALT) 10 MG tablet TAKE 1 TABLET BY MOUTH AS NEEDED FOR MIGRAINE, MAY REPEAT IN 2 HOURS AS NEEDED  . spironolactone (ALDACTONE) 25 MG tablet Take 25 mg by mouth daily.  Marland Kitchen topiramate (TOPAMAX) 100 MG tablet TAKE 1 TABLET (100 MG TOTAL) BY MOUTH 2 (TWO) TIMES DAILY.  Marland Kitchen tretinoin (RETIN-A) 0.025 % cream APPLY TO AFFECTED  AREA ON FACE ONCE DAILY AT BEDTIME  . [DISCONTINUED] LINZESS 290 MCG CAPS capsule TAKE ONE CAPSULE EVERY DAY  . predniSONE (DELTASONE) 10 MG tablet Take 10 mg by mouth as needed (take as directed with food or milk* 3 tabs daily for 3 day, 2 tabs for 3 days, 1 tab for 5 days). Reported on 08/26/2015  . [DISCONTINUED] cetirizine (ZYRTEC) 10 MG tablet Take 10 mg by mouth daily.  . [DISCONTINUED] predniSONE (DELTASONE) 10 MG tablet TAKE AS DIRECTED WITH FOOD OR MILK * 3 TABS DAILY FOR 3 DAYS, 2 TABS FOR 3 DAYS, 1 TAB FOR 5 DAYS  . [DISCONTINUED] rizatriptan (MAXALT) 10 MG tablet TAKE 1 TABLET BY MOUTH AS NEEDED FOR MIGRAINE, MAY REPEAT IN 2 HOURS AS NEEDED  . [DISCONTINUED] simethicone (GAS-X) 80 MG chewable tablet Chew 1 tablet (80 mg total) by mouth every 6 (six) hours as needed for flatulence.   No facility-administered encounter medications on file as of 08/26/2015.    Allergies as of 08/26/2015 - Review Complete 08/26/2015  Allergen Reaction Noted  . Tylox [oxycodone-acetaminophen] Itching 11/20/2011  . Codeine  08/05/2011    Past Medical History  Diagnosis Date  . IBS (irritable bowel syndrome)   . Anxiety   . Migraines   . Neuropathy ([place])   . Endometriosis   . Anemia   . Shoulder pain   . Brachial neuritis or radiculitis NOS   . Migraine, unspecified, without mention of intractable migraine without mention of  status migrainosus   . H/O: hysterectomy     Past Surgical History  Procedure Laterality Date  . Abdominal hysterectomy    . Cholecystectomy    . Diagnostic laparoscopy    . Laparoscopy  11/17/2011    Procedure: LAPAROSCOPY OPERATIVE;  Surgeon: Darlyn Chamber, MD;  Location: [facility];  Service: Gynecology;  Laterality: N/A;  with Injection of Vaginal Cuff with Dexamethasone mixed in 0.5% Bupivacaine  . Cystoscopy  11/17/2011    Procedure: CYSTOSCOPY;  Surgeon: Darlyn Chamber, MD;  Location: [facility];  Service: Gynecology;;    Family History  Problem Relation Age of Onset  .  Diabetes Mother     both sides grandparents  . Hypertension Mother   . Colon polyps Mother   . Thyroid disease Father   . Colon polyps Father     Social History   Social History  . Marital Status: Married    Spouse Name: [name]  . Number of Children: 2  . Years of Education: 2-degree   Occupational History  . stay at home    Social History Main Topics  . Smoking status: Never Smoker   . Smokeless tobacco: Never Used  . Alcohol Use: No  . Drug Use: No  . Sexual Activity: Yes    Birth Control/ Protection: Surgical   Other Topics Concern  . Not on file   Social History Narrative   Patient lives at home with her husband [name]. Drinks three cups of caffeine daily. Patient has two years of college education.       Review of systems: Review of Systems  Constitutional: Negative for fever and chills.  HENT: Negative.   Eyes: Negative for blurred vision.  Respiratory: Negative for cough, shortness of breath and wheezing.   Cardiovascular: Negative for chest pain and palpitations.  Gastrointestinal: as per HPI Genitourinary: Negative for dysuria, urgency, frequency and hematuria.  Musculoskeletal: Negative for myalgias, back pain and joint pain.  Skin: Negative for itching and rash.  Neurological: Negative for dizziness, tremors, focal weakness, seizures and loss of consciousness.  positive for headaches Endo/Heme/Allergies: Positive for environmental allergies.  Psychiatric/Behavioral: Negative for depression, suicidal ideas and hallucinations.  All other systems reviewed and are negative.   Physical Exam: Filed Vitals:   08/26/15 1529  BP: 90/66  Pulse: 68   Gen:      No acute distress HEENT:  EOMI, sclera anicteric Neck:     No masses; no thyromegaly Lungs:    Clear to auscultation bilaterally; normal respiratory effort CV:         Regular rate and rhythm; no murmurs Abd:      + bowel sounds; soft, non-tender; no palpable masses, no distension Ext:    No edema;  adequate peripheral perfusion Skin:      Warm and dry; no rash Neuro: alert and oriented x 3 Psych: normal mood and affect  Data Reviewed: Reviewed pertinent GI workup as per history of present illness   Assessment and Plan/Recommendations:  50 year old female with history of irritable bowel syndrome predominant constipation here for follow-up visit We'll decrease linzess to 145 g daily Avoid high-fiber diet Maintain adequate hydration Levsin as needed Return in 6 months Due for recall screening colonoscopy in 2024  K. Denzil Magnuson , MD [phone] Mon-Fri 8a-5p [phone] after 5p, weekends, holidays

## 2015-09-30 ENCOUNTER — Telehealth: Payer: Self-pay | Admitting: Adult Health

## 2015-09-30 NOTE — Telephone Encounter (Signed)
I called the patient and left a VM.

## 2015-09-30 NOTE — Telephone Encounter (Signed)
Pt sts she takes cymbalta 60mg , she is requesting to increase to 90mg  1 x day. Sts the pain is starting to worse in back and legs for about the past 1-2 months.

## 2015-10-01 MED ORDER — DULOXETINE HCL 30 MG PO CPEP: 90.0000 mg | ORAL_CAPSULE | Freq: Every day | ORAL | Status: DC

## 2015-10-01 NOTE — Telephone Encounter (Signed)
[name] is a 50 year old female with a history of headaches as well as low back pain radiating to the right lower extremity. She states that she was initially started on Cymbalta for the discomfort in her back and in the leg. She states it was beneficial however recently she's felt this discomfort starting to return. She would like to increase her Cymbalta. We will increase to 90 mg daily. She will let me know if this is not beneficial.

## 2015-10-01 NOTE — Addendum Note (Signed)
Addended by: Trudie Buckler on: 10/01/2015 06:21 PM   Modules accepted: Orders

## 2015-10-01 NOTE — Telephone Encounter (Signed)
Patient is calling back to speak with [facility].  Please return her call.

## 2015-12-15 ENCOUNTER — Other Ambulatory Visit: Payer: Self-pay | Admitting: Neurology

## 2016-01-27 DIAGNOSIS — R51 Headache: Secondary | ICD-10-CM | POA: Diagnosis not present

## 2016-02-12 ENCOUNTER — Ambulatory Visit: Payer: BLUE CROSS/BLUE SHIELD | Admitting: Adult Health

## 2016-02-14 ENCOUNTER — Encounter: Payer: Self-pay | Admitting: Adult Health

## 2016-02-18 DIAGNOSIS — G44019 Episodic cluster headache, not intractable: Secondary | ICD-10-CM | POA: Diagnosis not present

## 2016-02-18 DIAGNOSIS — J324 Chronic pansinusitis: Secondary | ICD-10-CM | POA: Diagnosis not present

## 2016-02-18 DIAGNOSIS — J309 Allergic rhinitis, unspecified: Secondary | ICD-10-CM | POA: Diagnosis not present

## 2016-02-18 DIAGNOSIS — R51 Headache: Secondary | ICD-10-CM | POA: Diagnosis not present

## 2016-02-19 ENCOUNTER — Telehealth: Payer: Self-pay | Admitting: Adult Health

## 2016-02-19 NOTE — Telephone Encounter (Signed)
Pt called in after receiving no show letter. She said she called in the night before to cancel. It was due to an dental emergency, crown fell off. She said she called as soon as it happened. Wants to know if no show can be taken off her record. Please call and discuss

## 2016-02-19 NOTE — Telephone Encounter (Signed)
Spoke to pt and relayed that NS would be waived.   She verbalized understanding.

## 2016-02-19 NOTE — Telephone Encounter (Signed)
Ok to waive no-show. Please let patient know Angie- just FYI

## 2016-03-03 ENCOUNTER — Other Ambulatory Visit: Payer: Self-pay | Admitting: *Deleted

## 2016-03-04 ENCOUNTER — Telehealth: Payer: Self-pay | Admitting: Adult Health

## 2016-03-04 NOTE — Telephone Encounter (Signed)
Patient called to request refills of DULoxetine (CYMBALTA) 30 MG capsule, predniSONE (DELTASONE) 10 MG tablet, topiramate (TOPAMAX) 100 MG tablet, rizatriptan (MAXALT) 10 MG tablet

## 2016-03-04 NOTE — Telephone Encounter (Signed)
LMVM for pt to return call to reschedule appt and can get refill up until then.

## 2016-03-05 MED ORDER — DULOXETINE HCL 30 MG PO CPEP: 90.0000 mg | ORAL_CAPSULE | Freq: Every day | ORAL | 5 refills | Status: DC

## 2016-03-05 MED ORDER — TOPIRAMATE 100 MG PO TABS: ORAL_TABLET | ORAL | 3 refills | Status: DC

## 2016-03-05 NOTE — Telephone Encounter (Signed)
Refilled topamax and cymbalta.  Maxalt has refills on it from 11/2015. Prednisone did not. Pt has appt 03-19-16 for f/u.

## 2016-03-06 DIAGNOSIS — J342 Deviated nasal septum: Secondary | ICD-10-CM | POA: Diagnosis not present

## 2016-03-06 DIAGNOSIS — J3489 Other specified disorders of nose and nasal sinuses: Secondary | ICD-10-CM | POA: Diagnosis not present

## 2016-03-06 DIAGNOSIS — R51 Headache: Secondary | ICD-10-CM | POA: Diagnosis not present

## 2016-03-06 NOTE — Telephone Encounter (Signed)
LMVM for pt that refilled topamax, generic cymbalta.  maxalt had refills, and prednisone is not one we refill.  If she had questions to call us back Monday, other wise will see 03-19-16 for RV with MM/NP.

## 2016-03-17 DIAGNOSIS — Z79899 Other long term (current) drug therapy: Secondary | ICD-10-CM | POA: Diagnosis not present

## 2016-03-17 DIAGNOSIS — L700 Acne vulgaris: Secondary | ICD-10-CM | POA: Diagnosis not present

## 2016-03-19 ENCOUNTER — Encounter: Payer: Self-pay | Admitting: Adult Health

## 2016-03-19 ENCOUNTER — Ambulatory Visit (INDEPENDENT_AMBULATORY_CARE_PROVIDER_SITE_OTHER): Payer: BLUE CROSS/BLUE SHIELD | Admitting: Adult Health

## 2016-03-19 VITALS — BP 113/78 | HR 65 | Ht 67.5 in | Wt 157.4 lb

## 2016-03-19 DIAGNOSIS — G43009 Migraine without aura, not intractable, without status migrainosus: Secondary | ICD-10-CM | POA: Diagnosis not present

## 2016-03-19 MED ORDER — RIZATRIPTAN BENZOATE 10 MG PO TABS: ORAL_TABLET | ORAL | 6 refills | Status: DC

## 2016-03-19 MED ORDER — TOPIRAMATE 100 MG PO TABS: ORAL_TABLET | ORAL | 3 refills | Status: DC

## 2016-03-19 MED ORDER — DICLOFENAC SODIUM 50 MG PO TBEC: DELAYED_RELEASE_TABLET | ORAL | 0 refills | Status: DC

## 2016-03-19 MED ORDER — DULOXETINE HCL 30 MG PO CPEP: 90.0000 mg | ORAL_CAPSULE | Freq: Every day | ORAL | 5 refills | Status: DC

## 2016-03-19 NOTE — Patient Instructions (Signed)
Continue topamax and cymbalta  Continue maxalt and diclofenac  If your symptoms worsen or you develop new symptoms please let us know.

## 2016-03-19 NOTE — Progress Notes (Signed)
PATIENT: [name] DOB: [birth year]  REASON FOR VISIT: follow up- migraine headache HISTORY FROM: patient  HISTORY OF PRESENT ILLNESS: [name] is a 50 year old female with a history of migraine headaches. She returns today for follow-up. She is currently taking Topamax and Cymbalta. She reports that her headaches have remained stable. She states that she has surgery 2 weeks ago to correct a deviated septum. She reports that there was a bone spur and she is wondering if this contributed to her headaches. She states that she has at least one headache a week. She typically can take diclofenac and Maxalt and her headache will resolve. Occasionally she'll have a headache that lasts for 3 days. She denies photophobia, phonophobia and vomiting with her headaches. She does occasionally have nausea. In the past she has tried Zomig, Treximet and Relpax. She returns today for an evaluation.  HISTORY 02/12/15: Meghan. [name] is a 50 year old female with a history of migraine headaches. She returns today for an evaluation. She is currently taken Topamax 100 mg twice a day. She reports that she continues to have 3-4 headaches a month. This is unchanged since the last visit. Her headaches are normally located on the right side of the head and across the maxillary sinuses. She denies photophobia and phonophobia. Denies vomiting but will occasionally have nausea. She states that smell is a trigger for her headaches. The patient was on Cymbalta for low back pain that radiates into the right leg. This is working well for the patient however she states that she went off the medication just to see what it was like off of the medication. She states that she did not want to be dependent on medication. However her symptoms have returned. She would like to restart the Cymbalta. She denies any new neurological symptoms. She returns today for an evaluation.  HISTORY 02/21/14 [name]): 50 year old right-handed white married  female with a history of headaches followed by Dr. Erling Cruz.  Her headache beginning in childhood since age 49, and changing in pattern since 2009. The headaches are always on the left side, unrelated to her menstrual cycle, and not associated with visual disturbance or nausea and vomiting. but has a history of endometriosis.   The headaches last intermitently for 5 days and are improved with Ketoprofen, Maxalt therapy, and coffee. Her daughter has been to the headache clinic and uses bio freeze to the scalp as well as Maxalt. [name], son, grandmother, and mother have migraine.   The patient has tried food restrictions without success. She is on topirimate 50 mg in the morning and 100 mg at night without significant side effects. She has word finding difficulties and confusion that have not been serious.   She averages 3 migraine headache days per month. These are throbbing without associated nausea and vomiting made worse by smells such as Vanilla, and can have an ice pick quality. Maxalt and ketoprofen are effective almost 100% of the time.  She also has 4 headaches per week. which are not as severe Her headaches can last 5 days in a row requiring steroids and currently she is on a tapering course of steroids for a one-month history of headaches.She has a two-year history of pain occurring in her shoulder blade region extending to her left arm along the inner aspect with discomfort in the second, third ,fourth, and fifth fingers of the left hand. There is no definite numbness but she describes "tingling".   The pain in the neck and  symptoms in the left arm are made worse by flexion of her neck. Her neck symptoms have improved since discontinuing exercise programs. She reports the onset of symptoms in her right leg with pain extending to the toes of her right foot on the outer aspect of her right foot for one year.  This began following an exercise that she performed. Her  daytime activities include going to the gym, cleaning the house and driving her children. She does not play golf or tennis She enjoys "spinning" exercises with stationary bicycles. 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   MRI study of the brain without and with contrast 05/13/2006 which I reviewed is normal.   Cervical spine x-rays 08/01/2010 which I reviewed are normal. MRI of the cervical spine 09/16/2010 shows a shallow central disc protrusion at C6-7 which is non compressive. The C7-T1 level is not as well seen. There is a right lateral pharyngeal recess finding that is nonspecific.   This was evaluated by Dr. 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 Lumbar MRI 09/29/2010 shows facet arthropathy at L2-3, L3-4, L4-5, and a small annular rent eccentric to the left at L5-S1.She tolerates medications well without significant side effects.   She has irritable bowel syndrome gas, bloating followed by Dr. Olevia Perches and currently treated with Xanax, hyoscyamine, and Align.  She underwent ovarian scar tissue surgery 11/17/11 with improvement in symptoms from her gynecologist She developed severe headaches that were almost continuous 12/2010 and was evaluated in the office where she was treated with IV Depacon. CBC and CMP were unremarkable. She was given a tapering course of prednisone. She has received another prednisone taper since she was last here by her primary care physician. Since 8/18 when she went to the beach and sat in foldout chairs, she has pain in her right back, swelling in her right foot and lower leg, pain with walking, and numbness in the lateral anterior shin and side of her foot. It is made worse sitting and decreased standing. There is no change with bending over coughing or sneezing.   She gave up exercising and restarted gabapentin 100 twice a day and 300 at night. She has had significant improvement since beginning Cymbalta 60 mg per day. She  has not had side effects.  UPDATE June 3rd 2014.  She is now taking Cymbalta 90 each day, which helps her low back, leg pain better, she has 3-4 migraine headaches each month, Maxalt, ketoprofen has been very helpful. She denies significant side effect.  Topamax 100mg  am, 100mg  pm.  Her migraine is fine, seasonal, with thw eather changes, zyetrec.  UPDATE August 28th 2015: she continues to have migraines, 3-4 times each month, Topamax 100 mg twice a day works well, Maxalt has been helpful, she wants refill her medications  REVIEW OF SYSTEMS: Out of a complete 14 system review of symptoms, the patient complains only of the following symptoms, and all other reviewed systems are negative.  Headache  ALLERGIES: Allergies  Allergen Reactions  . Tylox [Oxycodone-Acetaminophen] Itching  . Codeine     nausea    HOME MEDICATIONS: Outpatient Medications Prior to Visit  Medication Sig Dispense Refill  . ALPRAZolam (XANAX) 0.5 MG tablet TAKE 1 TABLET AT BEDTIME AS NEEDED 30 tablet 0  . hyoscyamine (LEVSIN SL) 0.125 MG SL tablet Take 1 tablet (0.125 mg total) by mouth every 6 (six) hours as needed. 60 tablet 3  . Linaclotide (LINZESS) 145 MCG CAPS capsule Take 1 capsule (145  mcg total) by mouth daily. 30 capsule 6  . predniSONE (DELTASONE) 10 MG tablet Take 10 mg by mouth as needed (take as directed with food or milk* 3 tabs daily for 3 day, 2 tabs for 3 days, 1 tab for 5 days). Reported on 08/26/2015    . spironolactone (ALDACTONE) 25 MG tablet Take 25 mg by mouth daily.    Marland Kitchen tretinoin (RETIN-A) 0.025 % cream APPLY TO AFFECTED AREA ON FACE ONCE DAILY AT BEDTIME  11  . diclofenac (VOLTAREN) 50 MG EC tablet TAKE 1 TABLET BY MOUTH ONCE DAILY AS NEEDED FOR HEADACHE, *DONT EXCEED 2TABS/24HRS* 30 tablet 0  . DULoxetine (CYMBALTA) 30 MG capsule Take 3 capsules (90 mg total) by mouth daily. 90 capsule 5  . rizatriptan (MAXALT) 10 MG tablet TAKE 1 TABLET BY MOUTH AS NEEDED FOR MIGRAINE, MAY  REPEAT IN 2 HOURS AS NEEDED 12 tablet 6  . topiramate (TOPAMAX) 100 MG tablet TAKE 1 TABLET (100 MG TOTAL) BY MOUTH 2 (TWO) TIMES DAILY. 180 tablet 3   No facility-administered medications prior to visit.     PAST MEDICAL HISTORY: Past Medical History:  Diagnosis Date  . Anemia   . Anxiety   . Brachial neuritis or radiculitis NOS   . Endometriosis   . H/O: hysterectomy   . IBS (irritable bowel syndrome)   . Migraine, unspecified, without mention of intractable migraine without mention of status migrainosus   . Migraines   . Neuropathy ([place])   . Shoulder pain     PAST SURGICAL HISTORY: Past Surgical History:  Procedure Laterality Date  . ABDOMINAL HYSTERECTOMY    . CHOLECYSTECTOMY    . CYSTOSCOPY  11/17/2011   Procedure: CYSTOSCOPY;  Surgeon: Darlyn Chamber, MD;  Location:  [facility];  Service: Gynecology;;  . DIAGNOSTIC LAPAROSCOPY    . LAPAROSCOPY  11/17/2011   Procedure: LAPAROSCOPY OPERATIVE;  Surgeon: Darlyn Chamber, MD;  Location: [facility];  Service: Gynecology;  Laterality: N/A;  with Injection of Vaginal Cuff with Dexamethasone mixed in 0.5% Bupivacaine    FAMILY HISTORY: Family History  Problem Relation Age of Onset  . Diabetes Mother     both sides grandparents  . Hypertension Mother   . Colon polyps Mother   . Thyroid disease Father   . Colon polyps Father     SOCIAL HISTORY: Social History   Social History  . Marital status: Married    Spouse name: [name]  . Number of children: 2  . Years of education: 2-degree   Occupational History  . stay at home    Social History Main Topics  . Smoking status: Never Smoker  . Smokeless tobacco: Never Used  . Alcohol use No  . Drug use: No  . Sexual activity: Yes    Birth control/ protection: Surgical   Other Topics Concern  . Not on file   Social History Narrative   Patient lives at home with her husband [name]. Drinks three cups of caffeine daily. Patient has two years of college education.       PHYSICAL  EXAM  Vitals:   03/19/16 1359  BP: 113/78  Pulse: 65  Weight: 157 lb 6.4 oz (71.4 kg)  Height: 5' 7.5" (1.715 m)   Body mass index is 24.29 kg/m.  Generalized: Well developed, in no acute distress   Neurological examination  Mentation: Alert oriented to time, place, history taking. Follows all commands speech and language fluent Cranial nerve II-XII: Pupils were equal round reactive to light. Extraocular  movements were full, visual field were full on confrontational test. Facial sensation and strength were normal. Uvula tongue midline. Head turning and shoulder shrug  were normal and symmetric. Motor: The motor testing reveals 5 over 5 strength of all 4 extremities. Good symmetric motor tone is noted throughout.  Sensory: Sensory testing is intact to soft touch on all 4 extremities. No evidence of extinction is noted.  Coordination: Cerebellar testing reveals good finger-nose-finger and heel-to-shin bilaterally.  Gait and station: Gait is normal. Tandem gait is normal. Romberg is negative. No drift is seen.  Reflexes: Deep tendon reflexes are symmetric and normal bilaterally.   DIAGNOSTIC DATA (LABS, IMAGING, TESTING) - I reviewed patient records, labs, notes, testing and imaging myself where available.   ASSESSMENT AND PLAN 50 y.o. year old female  has a past medical history of Anemia; Anxiety; Brachial neuritis or radiculitis NOS; Endometriosis; H/O: hysterectomy; IBS (irritable bowel syndrome); Migraine, unspecified, without mention of intractable migraine without mention of status migrainosus; Migraines; Neuropathy ([place]); and Shoulder pain. here with:  1. Migraine headaches  The patient is currently happy with her medication regimen. She will continue on Topamax, Cymbalta, diclofenac and Maxalt. Advised that if her headache frequency or severity increases she should let us know. Follow-up in 6 months with Dr. Krista Jensen.   Ward Givens, MSN, NP-C 03/19/2016, 2:42 PM [facility] [address], [place], [place] [address] [phone]

## 2016-03-25 NOTE — Progress Notes (Signed)
I have reviewed and agreed above plan. 

## 2016-03-27 ENCOUNTER — Other Ambulatory Visit: Payer: Self-pay | Admitting: Neurology

## 2016-04-12 ENCOUNTER — Other Ambulatory Visit: Payer: Self-pay | Admitting: Adult Health

## 2016-05-01 ENCOUNTER — Other Ambulatory Visit: Payer: Self-pay | Admitting: *Deleted

## 2016-05-01 MED ORDER — DULOXETINE HCL 30 MG PO CPEP: 90.0000 mg | ORAL_CAPSULE | Freq: Every day | ORAL | 1 refills | Status: DC

## 2016-08-23 ENCOUNTER — Other Ambulatory Visit: Payer: Self-pay | Admitting: Gastroenterology

## 2016-08-24 ENCOUNTER — Other Ambulatory Visit: Payer: Self-pay | Admitting: Neurology

## 2016-09-01 DIAGNOSIS — H1012 Acute atopic conjunctivitis, left eye: Secondary | ICD-10-CM | POA: Diagnosis not present

## 2016-09-08 DIAGNOSIS — H1012 Acute atopic conjunctivitis, left eye: Secondary | ICD-10-CM | POA: Diagnosis not present

## 2016-09-09 DIAGNOSIS — Z01419 Encounter for gynecological examination (general) (routine) without abnormal findings: Secondary | ICD-10-CM | POA: Diagnosis not present

## 2016-09-09 DIAGNOSIS — E6609 Other obesity due to excess calories: Secondary | ICD-10-CM | POA: Diagnosis not present

## 2016-09-09 DIAGNOSIS — Z13820 Encounter for screening for osteoporosis: Secondary | ICD-10-CM | POA: Diagnosis not present

## 2016-09-09 DIAGNOSIS — Z1231 Encounter for screening mammogram for malignant neoplasm of breast: Secondary | ICD-10-CM | POA: Diagnosis not present

## 2016-09-09 DIAGNOSIS — Z6824 Body mass index (BMI) 24.0-24.9, adult: Secondary | ICD-10-CM | POA: Diagnosis not present

## 2016-09-09 DIAGNOSIS — R5383 Other fatigue: Secondary | ICD-10-CM | POA: Diagnosis not present

## 2016-09-16 DIAGNOSIS — Z6824 Body mass index (BMI) 24.0-24.9, adult: Secondary | ICD-10-CM | POA: Diagnosis not present

## 2016-09-16 DIAGNOSIS — R635 Abnormal weight gain: Secondary | ICD-10-CM | POA: Diagnosis not present

## 2016-09-16 DIAGNOSIS — Z13220 Encounter for screening for lipoid disorders: Secondary | ICD-10-CM | POA: Diagnosis not present

## 2016-09-16 DIAGNOSIS — R5383 Other fatigue: Secondary | ICD-10-CM | POA: Diagnosis not present

## 2016-09-21 DIAGNOSIS — K5909 Other constipation: Secondary | ICD-10-CM | POA: Diagnosis not present

## 2016-09-21 DIAGNOSIS — F321 Major depressive disorder, single episode, moderate: Secondary | ICD-10-CM | POA: Diagnosis not present

## 2016-09-21 DIAGNOSIS — Z1389 Encounter for screening for other disorder: Secondary | ICD-10-CM | POA: Diagnosis not present

## 2016-09-21 DIAGNOSIS — G43009 Migraine without aura, not intractable, without status migrainosus: Secondary | ICD-10-CM | POA: Diagnosis not present

## 2016-09-21 DIAGNOSIS — R7309 Other abnormal glucose: Secondary | ICD-10-CM | POA: Diagnosis not present

## 2016-10-08 DIAGNOSIS — Z6824 Body mass index (BMI) 24.0-24.9, adult: Secondary | ICD-10-CM | POA: Diagnosis not present

## 2016-10-08 DIAGNOSIS — J019 Acute sinusitis, unspecified: Secondary | ICD-10-CM | POA: Diagnosis not present

## 2016-10-08 DIAGNOSIS — J028 Acute pharyngitis due to other specified organisms: Secondary | ICD-10-CM | POA: Diagnosis not present

## 2016-10-08 DIAGNOSIS — R05 Cough: Secondary | ICD-10-CM | POA: Diagnosis not present

## 2016-10-12 DIAGNOSIS — Z6824 Body mass index (BMI) 24.0-24.9, adult: Secondary | ICD-10-CM | POA: Diagnosis not present

## 2016-10-12 DIAGNOSIS — G43009 Migraine without aura, not intractable, without status migrainosus: Secondary | ICD-10-CM | POA: Diagnosis not present

## 2016-10-12 DIAGNOSIS — F321 Major depressive disorder, single episode, moderate: Secondary | ICD-10-CM | POA: Diagnosis not present

## 2016-10-12 DIAGNOSIS — J019 Acute sinusitis, unspecified: Secondary | ICD-10-CM | POA: Diagnosis not present

## 2016-10-15 DIAGNOSIS — R7309 Other abnormal glucose: Secondary | ICD-10-CM | POA: Diagnosis not present

## 2016-10-15 DIAGNOSIS — R799 Abnormal finding of blood chemistry, unspecified: Secondary | ICD-10-CM | POA: Diagnosis not present

## 2016-11-07 ENCOUNTER — Other Ambulatory Visit: Payer: Self-pay | Admitting: Adult Health

## 2017-03-01 ENCOUNTER — Other Ambulatory Visit: Payer: Self-pay | Admitting: Adult Health

## 2017-03-01 ENCOUNTER — Other Ambulatory Visit: Payer: Self-pay | Admitting: Neurology

## 2017-03-23 ENCOUNTER — Ambulatory Visit (INDEPENDENT_AMBULATORY_CARE_PROVIDER_SITE_OTHER): Payer: BLUE CROSS/BLUE SHIELD | Admitting: Neurology

## 2017-03-23 ENCOUNTER — Encounter: Payer: Self-pay | Admitting: Neurology

## 2017-03-23 VITALS — BP 114/72 | HR 85 | Ht 67.5 in | Wt 163.0 lb

## 2017-03-23 DIAGNOSIS — G43809 Other migraine, not intractable, without status migrainosus: Secondary | ICD-10-CM | POA: Diagnosis not present

## 2017-03-23 DIAGNOSIS — M79604 Pain in right leg: Secondary | ICD-10-CM | POA: Diagnosis not present

## 2017-03-23 DIAGNOSIS — R202 Paresthesia of skin: Secondary | ICD-10-CM | POA: Insufficient documentation

## 2017-03-23 MED ORDER — DULOXETINE HCL 30 MG PO CPEP: 60.0000 mg | ORAL_CAPSULE | Freq: Every day | ORAL | 4 refills | Status: DC

## 2017-03-23 MED ORDER — SUMATRIPTAN SUCCINATE 6 MG/0.5ML ~~LOC~~ SOLN: 6.0000 mg | SUBCUTANEOUS | 11 refills | Status: DC | PRN

## 2017-03-23 MED ORDER — ONDANSETRON 4 MG PO TBDP: 4.0000 mg | ORAL_TABLET | Freq: Three times a day (TID) | ORAL | 6 refills | Status: DC | PRN

## 2017-03-23 MED ORDER — TIZANIDINE HCL 4 MG PO TABS: 4.0000 mg | ORAL_TABLET | Freq: Four times a day (QID) | ORAL | 6 refills | Status: DC | PRN

## 2017-03-23 MED ORDER — RIZATRIPTAN BENZOATE 10 MG PO TABS: 10.0000 mg | ORAL_TABLET | ORAL | 11 refills | Status: DC | PRN

## 2017-03-23 MED ORDER — TOPIRAMATE 100 MG PO TABS: 200.0000 mg | ORAL_TABLET | Freq: Every day | ORAL | 11 refills | Status: DC

## 2017-03-23 NOTE — Progress Notes (Signed)
PATIENT: ELYSABETH Jensen DOB: [birth year]  REASON FOR VISIT: follow up- migraine headache HISTORY FROM: patient   HISTORY 02/21/14 [name]): 51 year old right-handed white married female with a history of headaches followed by Dr. Erling Cruz.  Her headache beginning in childhood since age 29, and changing in pattern since 2009. The headaches are always on the left side, unrelated to her menstrual cycle, and not associated with visual disturbance or nausea and vomiting. but has a history of endometriosis.   The headaches last intermitently for 5 days and are improved with Ketoprofen, Maxalt therapy, and coffee. Her daughter has been to the headache clinic and uses bio freeze to the scalp as well as Maxalt. [name], son, grandmother, and mother have migraine.   The patient has tried food restrictions without success. She is on topirimate 50 mg in the morning and 100 mg at night without significant side effects. She has word finding difficulties and confusion that have not been serious.   She averages 3 migraine headache days per month. These are throbbing without associated nausea and vomiting made worse by smells such as Vanilla, and can have an ice pick quality. Maxalt and ketoprofen are effective almost 100% of the time.  She also has 4 headaches per week. which are not as severe Her headaches can last 5 days in a row requiring steroids and currently she is on a tapering course of steroids for a one-month history of headaches.She has a two-year history of pain occurring in her shoulder blade region extending to her left arm along the inner aspect with discomfort in the second, third ,fourth, and fifth fingers of the left hand. There is no definite numbness but she describes "tingling".   The pain in the neck and symptoms in the left arm are made worse by flexion of her neck. Her neck symptoms have improved since discontinuing exercise programs. She reports the onset of  symptoms in her right leg with pain extending to the toes of her right foot on the outer aspect of her right foot for one year.  This began following an exercise that she performed. Her daytime activities include going to the gym, cleaning the house and driving her children. She does not play golf or tennis She enjoys "spinning" exercises with stationary bicycles. 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   MRI study of the brain without and with contrast 05/13/2006 which I reviewed is normal.   Cervical spine x-rays 08/01/2010 which I reviewed are normal. MRI of the cervical spine 09/16/2010 shows a shallow central disc protrusion at C6-7 which is non compressive. The C7-T1 level is not as well seen. There is a right lateral pharyngeal recess finding that is nonspecific.   This was evaluated by Dr. 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 Lumbar MRI 09/29/2010 shows facet arthropathy at L2-3, L3-4, L4-5, and a small annular rent eccentric to the left at L5-S1.She tolerates medications well without significant side effects.   She has irritable bowel syndrome gas, bloating followed by Dr. Olevia Perches and currently treated with Xanax, hyoscyamine, and Align.  She underwent ovarian scar tissue surgery 11/17/11 with improvement in symptoms from her gynecologist She developed severe headaches that were almost continuous 12/2010 and was evaluated in the office where she was treated with IV Depacon. CBC and CMP were unremarkable. She was given a tapering course of prednisone. She has received another prednisone taper since she was last here by her primary care physician. Since 8/18  when she went to the beach and sat in foldout chairs, she has pain in her right back, swelling in her right foot and lower leg, pain with walking, and numbness in the lateral anterior shin and side of her foot. It is made worse sitting and decreased standing. There is no change with bending over  coughing or sneezing.   She gave up exercising and restarted gabapentin 100 twice a day and 300 at night. She has had significant improvement since beginning Cymbalta 60 mg per day. She has not had side effects.  UPDATE June 3rd 2014.  She is now taking Cymbalta 90 each day, which helps her low back, leg pain better, she has 3-4 migraine headaches each month, Maxalt, ketoprofen has been very helpful. She denies significant side effect.  Topamax 100mg  am, 100mg  pm.  Her migraine is fine, seasonal, with thw eather changes, zyetrec.  UPDATE August 28th 2015: she continues to have migraines, 3-4 times each month, Topamax 100 mg twice a day works well, Maxalt has been helpful, she wants refill her medications  UPDATE Sept 25 2018: She continue have migraine, about once every week, despite taking Topamax 100 mg twice a day as preventive medications, most recent migraine headache last 5 days, she has to take 2 tablets of Maxalt each day, used 10 tablets of Maxalt and baclofen in 5 days, she does have nausea, light sensitivity doing headaches,  She is on lower dose of Cymbalta 30 mg 2 tablets every morning now, she is active, playing tennis regularly, but often feel intermittent numbness tingling involving bilateral feet, bilateral hands,  REVIEW OF SYSTEMS: Out of a complete 14 system review of symptoms, the patient complains only of the following symptoms, and all other reviewed systems are negative.  Headache  ALLERGIES: Allergies  Allergen Reactions  . Tylox [Oxycodone-Acetaminophen] Itching  . Codeine     nausea    HOME MEDICATIONS: Outpatient Medications Prior to Visit  Medication Sig Dispense Refill  . ALPRAZolam (XANAX) 0.5 MG tablet TAKE 1 TABLET AT BEDTIME AS NEEDED 30 tablet 0  . diclofenac (VOLTAREN) 50 MG EC tablet TAKE 1 TABLET BY MOUTH ONCE DAILY AS NEEDED FOR HEADACHE, *DONT EXCEED 2TABS/24HRS* 30 tablet 0  . DULoxetine (CYMBALTA) 30 MG capsule TAKE 3 CAPSULES (90  MG TOTAL) BY MOUTH DAILY. 270 capsule 1  . hyoscyamine (LEVSIN SL) 0.125 MG SL tablet Take 1 tablet (0.125 mg total) by mouth every 6 (six) hours as needed. 60 tablet 3  . LINZESS 145 MCG CAPS capsule TAKE 1 CAPSULE (145 MCG TOTAL) BY MOUTH DAILY. 30 capsule 1  . predniSONE (DELTASONE) 10 MG tablet Take 10 mg by mouth as needed (take as directed with food or milk* 3 tabs daily for 3 day, 2 tabs for 3 days, 1 tab for 5 days). Reported on 08/26/2015    . rizatriptan (MAXALT) 10 MG tablet TAKE 1 TABLET AS NEEDED FOR MIGRAINE, MAY REPEAT IN 2 HOURS AS NEEDED 12 tablet 6  . spironolactone (ALDACTONE) 25 MG tablet Take 25 mg by mouth daily.    Marland Kitchen tretinoin (RETIN-A) 0.025 % cream APPLY TO AFFECTED AREA ON FACE ONCE DAILY AT BEDTIME  11  . topiramate (TOPAMAX) 100 MG tablet TAKE 1 TABLET (100 MG TOTAL) BY MOUTH 2 (TWO) TIMES DAILY. 180 tablet 3   No facility-administered medications prior to visit.     PAST MEDICAL HISTORY: Past Medical History:  Diagnosis Date  . Anemia   . Anxiety   . Brachial neuritis  or radiculitis NOS   . Endometriosis   . H/O: hysterectomy   . IBS (irritable bowel syndrome)   . Migraine, unspecified, without mention of intractable migraine without mention of status migrainosus   . Migraines   . Neuropathy   . Shoulder pain     PAST SURGICAL HISTORY: Past Surgical History:  Procedure Laterality Date  . ABDOMINAL HYSTERECTOMY    . CHOLECYSTECTOMY    . CYSTOSCOPY  11/17/2011   Procedure: CYSTOSCOPY;  Surgeon: Darlyn Chamber, MD;  Location: [facility];  Service: Gynecology;;  . DIAGNOSTIC LAPAROSCOPY    . LAPAROSCOPY  11/17/2011   Procedure: LAPAROSCOPY OPERATIVE;  Surgeon: Darlyn Chamber, MD;  Location: [facility];  Service: Gynecology;  Laterality: N/A;  with Injection of Vaginal Cuff with Dexamethasone mixed in 0.5% Bupivacaine    FAMILY HISTORY: Family History  Problem Relation Age of Onset  . Diabetes Mother        both sides grandparents  . Hypertension Mother   .  Colon polyps Mother   . Thyroid disease Father   . Colon polyps Father     SOCIAL HISTORY: Social History   Social History  . Marital status: Married    Spouse name: [name]  . Number of children: 2  . Years of education: 2-degree   Occupational History  . stay at home    Social History Main Topics  . Smoking status: Never Smoker  . Smokeless tobacco: Never Used  . Alcohol use No  . Drug use: No  . Sexual activity: Yes    Birth control/ protection: Surgical   Other Topics Concern  . Not on file   Social History Narrative   Patient lives at home with her husband [name]. Drinks three cups of caffeine daily. Patient has two years of college education.       PHYSICAL EXAM  Vitals:   03/23/17 1449  BP: 114/72  Pulse: 85  Weight: 163 lb (73.9 kg)  Height: 5' 7.5" (1.715 m)   Body mass index is 25.15 kg/m.  PHYSICAL EXAMNIATION:  Gen: NAD, conversant, well nourised, obese, well groomed                     Cardiovascular: Regular rate rhythm, no peripheral edema, warm, nontender. Eyes: Conjunctivae clear without exudates or hemorrhage Neck: Supple, no carotid bruits. Pulmonary: Clear to auscultation bilaterally   NEUROLOGICAL EXAM:  MENTAL STATUS: Speech:    Speech is normal; fluent and spontaneous with normal comprehension.  Cognition:     Orientation to time, place and person     Normal recent and remote memory     Normal Attention span and concentration     Normal Language, naming, repeating,spontaneous speech     Fund of knowledge   CRANIAL NERVES: CN II: Visual fields are full to confrontation. Fundoscopic exam is normal with sharp discs and no vascular changes. Pupils are round equal and briskly reactive to light. CN III, IV, VI: extraocular movement are normal. No ptosis. CN V: Facial sensation is intact to pinprick in all 3 divisions bilaterally. Corneal responses are intact.  CN VII: Face is symmetric with normal eye closure and smile. CN VIII:  Hearing is normal to rubbing fingers CN IX, X: Palate elevates symmetrically. Phonation is normal. CN XI: Head turning and shoulder shrug are intact CN XII: Tongue is midline with normal movements and no atrophy.  MOTOR: There is no pronator drift of out-stretched arms. Muscle bulk and tone are normal.  Muscle strength is normal.  REFLEXES: Reflexes are 2+ and symmetric at the biceps, triceps, knees, and ankles. Plantar responses are flexor.  SENSORY: Intact to light touch, pinprick, positional and vibratory sensation are intact in fingers and toes.  COORDINATION: Rapid alternating movements and fine finger movements are intact. There is no dysmetria on finger-to-nose and heel-knee-shin.    GAIT/STANCE: Posture is normal. Gait is steady with normal steps, base, arm swing, and turning. Heel and toe walking are normal. Tandem gait is normal.  Romberg is absent.    DIAGNOSTIC DATA (LABS, IMAGING, TESTING) - I reviewed patient records, labs, notes, testing and imaging myself where available.   ASSESSMENT AND PLAN 51 y.o. year old female   Chronic migraine headaches Paresthesia  Continue Cymbalta 30 mg 2 tablets every morning  Topamax 100 mg 2 tablets every night, to avoid the morning side effect of sleepiness, mental slowing  Maxalt plus diclofenac as needed for moderate headache,  She may use Imitrex injection as needed for more prolonged headaches, may also combine it with Zofran, tizanidine  Bilateral lower extremity paresthesia  EMG nerve conduction study for possible peripheral neuropathy  Laboratory evaluation for etiology  Marcial Pacas, M.D. Ph.D.  [facility] [place], [place] [address] Phone: [phone] Fax:      [phone]

## 2017-03-25 ENCOUNTER — Telehealth: Payer: Self-pay | Admitting: Neurology

## 2017-03-25 NOTE — Telephone Encounter (Signed)
Please call patient, extensive laboratory evaluation showed decreased IgG IgA level, which has unknown clinical significance, rest of the laboratory evaluation showed no significant abnormality

## 2017-03-25 NOTE — Telephone Encounter (Signed)
Spoke to patient - she is aware of lab results. 

## 2017-03-26 LAB — COMPREHENSIVE METABOLIC PANEL
ALT: 20 IU/L (ref 0–32)
AST: 17 IU/L (ref 0–40)
Albumin/Globulin Ratio: 2.5 — ABNORMAL HIGH (ref 1.2–2.2)
Albumin: 4.7 g/dL (ref 3.5–5.5)
Alkaline Phosphatase: 56 IU/L (ref 39–117)
BUN/Creatinine Ratio: 21 (ref 9–23)
BUN: 21 mg/dL (ref 6–24)
Bilirubin Total: 0.2 mg/dL (ref 0.0–1.2)
CO2: 25 mmol/L (ref 20–29)
Calcium: 9.5 mg/dL (ref 8.7–10.2)
Chloride: 101 mmol/L (ref 96–106)
Creatinine, Ser: 0.99 mg/dL (ref 0.57–1.00)
GFR calc Af Amer: 76 mL/min/{1.73_m2} (ref >59)
GFR calc non Af Amer: 66 mL/min/{1.73_m2} (ref >59)
Globulin, Total: 1.9 g/dL (ref 1.5–4.5)
Glucose: 76 mg/dL (ref 65–99)
Potassium: 4.3 mmol/L (ref 3.5–5.2)
Sodium: 141 mmol/L (ref 134–144)

## 2017-03-26 LAB — CBC WITH DIFFERENTIAL/PLATELET
Basophils Absolute: 0.1 10*3/uL (ref 0.0–0.2)
Basos: 1 %
EOS (ABSOLUTE): 0.2 10*3/uL (ref 0.0–0.4)
Eos: 3 %
Hematocrit: 42 % (ref 34.0–46.6)
Hemoglobin: 14.4 g/dL (ref 11.1–15.9)
Immature Grans (Abs): 0 10*3/uL (ref 0.0–0.1)
Immature Granulocytes: 0 %
Lymphocytes Absolute: 1.2 10*3/uL (ref 0.7–3.1)
Lymphs: 24 %
MCH: 32.2 pg (ref 26.6–33.0)
MCHC: 34.3 g/dL (ref 31.5–35.7)
MCV: 94 fL (ref 79–97)
Monocytes Absolute: 0.4 10*3/uL (ref 0.1–0.9)
Monocytes: 8 %
Neutrophils Absolute: 3.2 10*3/uL (ref 1.4–7.0)
Neutrophils: 64 %
Platelets: 268 10*3/uL (ref 150–379)
RBC: 4.47 x10E6/uL (ref 3.77–5.28)
RDW: 13.6 % (ref 12.3–15.4)
WBC: 5 10*3/uL (ref 3.4–10.8)

## 2017-03-26 LAB — IMMUNOFIXATION ELECTROPHORESIS
IgA/Immunoglobulin A, Serum: 84 mg/dL — ABNORMAL LOW (ref 87–352)
IgG (Immunoglobin G), Serum: 587 mg/dL — ABNORMAL LOW (ref 700–1600)
IgM (Immunoglobulin M), Srm: 51 mg/dL (ref 26–217)
Total Protein: 6.6 g/dL (ref 6.0–8.5)

## 2017-03-26 LAB — COPPER, SERUM: Copper: 105 ug/dL (ref 72–166)

## 2017-03-26 LAB — FOLATE: Folate: 8.1 ng/mL (ref >3.0)

## 2017-03-26 LAB — ANA W/REFLEX: Anti Nuclear Antibody(ANA): NEGATIVE

## 2017-03-26 LAB — VITAMIN B12: Vitamin B-12: 918 pg/mL (ref 232–1245)

## 2017-03-26 LAB — SEDIMENTATION RATE: Sed Rate: 2 mm/hr (ref 0–40)

## 2017-03-26 LAB — CK: Total CK: 71 U/L (ref 24–173)

## 2017-03-26 LAB — RPR: RPR Ser Ql: NONREACTIVE

## 2017-03-26 LAB — TSH: TSH: 2.55 u[IU]/mL (ref 0.450–4.500)

## 2017-03-26 LAB — HGB A1C W/O EAG: Hgb A1c MFr Bld: 5.4 % (ref 4.8–5.6)

## 2017-03-26 LAB — FERRITIN: Ferritin: 107 ng/mL (ref 15–150)

## 2017-03-26 LAB — VITAMIN D 25 HYDROXY (VIT D DEFICIENCY, FRACTURES): Vit D, 25-Hydroxy: 44.8 ng/mL (ref 30.0–100.0)

## 2017-03-26 LAB — C-REACTIVE PROTEIN: CRP: 0.5 mg/L (ref 0.0–4.9)

## 2017-03-30 DIAGNOSIS — L700 Acne vulgaris: Secondary | ICD-10-CM | POA: Diagnosis not present

## 2017-03-30 DIAGNOSIS — Z23 Encounter for immunization: Secondary | ICD-10-CM | POA: Diagnosis not present

## 2017-03-30 DIAGNOSIS — Z79899 Other long term (current) drug therapy: Secondary | ICD-10-CM | POA: Diagnosis not present

## 2017-03-31 ENCOUNTER — Other Ambulatory Visit: Payer: Self-pay | Admitting: *Deleted

## 2017-03-31 MED ORDER — DICLOFENAC SODIUM 50 MG PO TBEC: 50.0000 mg | DELAYED_RELEASE_TABLET | ORAL | 6 refills | Status: DC | PRN

## 2017-03-31 NOTE — Telephone Encounter (Signed)
Asking for 90 day supply

## 2017-04-09 ENCOUNTER — Encounter: Payer: BLUE CROSS/BLUE SHIELD | Admitting: Neurology

## 2017-04-13 ENCOUNTER — Telehealth: Payer: Self-pay | Admitting: Gastroenterology

## 2017-04-13 NOTE — Telephone Encounter (Signed)
Dr. Loletha Carrow will you accept the switch?

## 2017-04-13 NOTE — Telephone Encounter (Signed)
sure

## 2017-04-16 ENCOUNTER — Encounter (INDEPENDENT_AMBULATORY_CARE_PROVIDER_SITE_OTHER): Payer: Self-pay | Admitting: Neurology

## 2017-04-16 ENCOUNTER — Ambulatory Visit (INDEPENDENT_AMBULATORY_CARE_PROVIDER_SITE_OTHER): Payer: BLUE CROSS/BLUE SHIELD | Admitting: Neurology

## 2017-04-16 DIAGNOSIS — M79604 Pain in right leg: Secondary | ICD-10-CM

## 2017-04-16 DIAGNOSIS — G43809 Other migraine, not intractable, without status migrainosus: Secondary | ICD-10-CM

## 2017-04-16 DIAGNOSIS — R202 Paresthesia of skin: Secondary | ICD-10-CM | POA: Diagnosis not present

## 2017-04-16 DIAGNOSIS — Z0289 Encounter for other administrative examinations: Secondary | ICD-10-CM

## 2017-04-16 MED ORDER — GABAPENTIN 300 MG PO CAPS: 300.0000 mg | ORAL_CAPSULE | Freq: Three times a day (TID) | ORAL | 11 refills | Status: DC

## 2017-04-16 NOTE — Procedures (Signed)
Full Name: [name] Gender: Female MRN #: [id number] Date of Birth: 01/22/1968    Visit Date: 04/16/2017 10:49 Age: 51 Years 2 Months Old Examining Physician: Marcial Pacas, MD  Referring Physician: [name], MD History: 51 year old female presented with intermittent bilateral feet paresthesia  Summary of the test:  Nerve conduction study: Bilateral sural, superficial peroneal sensory responses were normal. Bilateral peroneal to EDB and tibial motor responses were normal.  Electromyography: Selected needle examination of right lower extremity muscles and right lumbosacral paraspinal muscles were normal.  Conclusion: This is a normal study. There is no electrodiagnostic evidence of large fiber peripheral neuropathy or right lumbosacral radiculopathy.    ------------------------------- Marcial Pacas, M.D.  [facility] [place], [place] [address] Tel: [phone] Fax: [phone]        [facility]    Nerve / Sites Muscle Latency Ref. Amplitude Ref. Rel Amp Segments Distance Velocity Ref. Area    ms ms mV mV %  cm m/s m/s mVms  L Peroneal - EDB     Ankle EDB 4.8 ?6.5 3.8 ?2.0 100 Ankle - EDB 9   13.9     Fib head EDB 11.1  3.6  94.1 Fib head - Ankle 30 48 ?44 13.4     Pop fossa EDB 13.0  3.6  98.4 Pop fossa - Fib head 10 53 ?44 13.3         Pop fossa - Ankle      R Peroneal - EDB     Ankle EDB 5.4 ?6.5 2.8 ?2.0 100 Ankle - EDB 9   9.1     Fib head EDB 12.0  2.3  81.9 Fib head - Ankle 30 45 ?44 8.1     Pop fossa EDB 14.6  2.9  126 Pop fossa - Fib head 12 46 ?44 9.3         Pop fossa - Ankle      L Tibial - AH     Ankle AH 4.4 ?5.8 12.7 ?4.0 100 Ankle - AH 9   25.7     Pop fossa AH 13.1  9.8  77.6 Pop fossa - Ankle 37 43 ?41 24.8  R Tibial - AH     Ankle AH 4.3 ?5.8 12.3 ?4.0 100 Ankle - AH 9   23.5     Pop fossa AH 13.1  11.4  92.8 Pop fossa - Ankle 37 42 ?41 20.2             SNC    Nerve / Sites Rec. Site Peak Lat Ref.  Amp Ref. Segments  Distance    ms ms V V  cm  R Sural - Ankle (Calf)     Calf Ankle 3.1 ?4.4 15 ?6 Calf - Ankle 14  L Sural - Ankle (Calf)     Calf Ankle 3.4 ?4.4 10 ?6 Calf - Ankle 14  R Superficial peroneal - Ankle     Lat leg Ankle 4.1 ?4.4 5 ?6 Lat leg - Ankle 14  L Superficial peroneal - Ankle     Lat leg Ankle 4.2 ?4.4 4 ?6 Lat leg - Ankle 14              F  Wave    Nerve F Lat Ref.   ms ms  L Tibial - AH 54.1 ?56.0  R Tibial - AH 54.2 ?56.0         EMG full  EMG Summary Table    Spontaneous MUAP Recruitment  Muscle IA Fib PSW Fasc Other Amp Dur. Poly Pattern  R. Tibialis anterior Normal None None None _______ Normal Normal Normal Normal  R. Tibialis posterior Normal None None None _______ Normal Normal Normal Normal  R. Peroneus longus Normal None None None _______ Normal Normal Normal Normal  R. Thoracic paraspinals Normal None None None _______ Normal Normal Normal Normal  R. Lumbar paraspinals (mid) Normal None None None _______ Normal Normal Normal Normal  R. Lumbar paraspinals (low) Normal None None None _______ Normal Normal Normal Normal  R. Gluteus medius Normal None None None _______ Normal Normal Normal Normal

## 2017-04-19 NOTE — Telephone Encounter (Signed)
I reviewed Dr. Woodward Ku last note and Dr. Nichola Sizer last note.  I do not know if I will have more to offer, but I would be happy to see her.

## 2017-04-20 NOTE — Telephone Encounter (Signed)
Appointment scheduled for 06-09-17 and patient notified

## 2017-04-27 ENCOUNTER — Ambulatory Visit (INDEPENDENT_AMBULATORY_CARE_PROVIDER_SITE_OTHER): Payer: BLUE CROSS/BLUE SHIELD | Admitting: Sports Medicine

## 2017-04-27 ENCOUNTER — Ambulatory Visit: Payer: Self-pay

## 2017-04-27 VITALS — BP 100/70 | Ht 68.0 in | Wt 160.0 lb

## 2017-04-27 DIAGNOSIS — M79672 Pain in left foot: Secondary | ICD-10-CM

## 2017-04-27 NOTE — Progress Notes (Signed)
Chief complaint: Left heel pain 5 months  History of present illness: [age] year old female who presents to the sports medicine office today with chief complaint of left heel pain. She reports that symptoms have been present for approximately 5 months. She reports that it started when she was doing some running with her daughter back approximately 5 months ago. She reports that she usually doesn't run, normally just plays tennis. She reports that she started to have left medial heel pain, describing it as a sharp throbbing pain. She reports having pain first thing when she wakes up in the morning and takes a few steps. She reports that when sitting for prolonged period of time she noticed that it stiffens up and when she puts weight on it again because his pain again. She reports that she has tried all sorts of different physical therapy stretches, using plantar fasciitis socks as well as ice, with no improvement in her symptoms. Today, she describes the pain as an 8/10. She feels as if symptoms are progressively worsening over the last 5 months or she is not report of any pain waking her up at nighttime. She does not report of any numbness or tingling. She says, occasionally reports feeling burning sensation. She is not report of any fevers, chills, or night sweats.  Review of systems:  As stated above  Interval past medical history, surgical history, family history, and social history obtained and unchanged. Please refer to EMR. History of diabetes, no current tobacco use, no surgery involving her left heel or foot, family history noncontributory to orthopedic complaint  Physical exam: Vital signs are reviewed and are documented in the chart Gen.: Alert, oriented, appears stated age, in no apparent distress HEENT: Moist oral mucosa Respiratory: Normal respirations, able to speak in full sentences Cardiac: Regular rate, distal pulses 2+ Integumentary: No rashes on visible skin:  Neurologic:  Strength 5/5, sensation 2+ in bilateral lower extremities Psych: Normal affect, mood is described as good Musculoskeletal: Inspection of her left foot reveals no obvious deformity or muscle atrophy, no warmth, erythema, ecchymosis, or effusion, she is tender to palpation over the plantar aspect of her left foot on the medial edge epicranial insertion of the plantar fascia, this is accenuated with dorsiflexion, he has no tenderness to palpation over the Lisfranc region, navicular, or base of fifth metatarsal, no tenderness to palpation over both medial lateral malleoli, she does have full ankle range of motion, no pain elicited with any type of ankle range of motion, anterior drawer and talar tilt negative, on gait evaluation she does have pes cavus bilaterally which has collapsed and has more forefoot splaying, with fourth and fifth digit subluxation bilaterally, on gait evaluation she does tend to supinate in place more pressure on the outside of her feet, she has developed bunionette on both of her feet  Assessment and plan: 1. Left heel pain, suspect secondary to plantar fasciitis 2. Gait abnormality, with collapsed pes cavus, fourth and fifth digit subluxation bilaterally,and forefoot spread and splaying bilaterally  Left heel pain -Discussed with patient that symptoms are most consistent with plantar fasciitis, discussed that there are mechanical aspects that have predisposed her to this condition -Discussed with her first thing to do is to place her in temporary green insoles to correct her collapsed pes cavus, scaphoid padding as well as fifth ray post, this will help prevent the force being applied on lateral aspect as well medially on each side, additionally, some padding has been placed over the heel  with blue EVA -Discussed nighttime splint as well as physical therapy exercises to do at home -Discussed as needed cryotherapy  Will have her follow-up in 4 weeks or sooner as needed. She will  most likely need custom orthotics to be done at that time if she does not have any issues with these temporary insoles.   Mort Sawyers, M.D. [place]

## 2017-05-19 ENCOUNTER — Other Ambulatory Visit: Payer: Self-pay | Admitting: Adult Health

## 2017-05-27 ENCOUNTER — Telehealth: Payer: Self-pay | Admitting: Neurology

## 2017-05-27 MED ORDER — GABAPENTIN 100 MG PO CAPS: 100.0000 mg | ORAL_CAPSULE | Freq: Three times a day (TID) | ORAL | 11 refills | Status: DC

## 2017-05-27 NOTE — Telephone Encounter (Signed)
Spoke to patient - says she has previously been on gabapentin 100mg  and it worked well for her.  She is requesting a prescription be sent for this lower dose to her pharmacy.  Ok per vo by Dr. [name] to provide rx for gabapentin 100mg . TID.

## 2017-05-27 NOTE — Telephone Encounter (Signed)
Pt called said she can not start out taking gabapentin (NEURONTIN) 300 MG capsule tid. Pt said she would like to start out with 100mg  tid if possible. Please call to advise

## 2017-06-08 ENCOUNTER — Ambulatory Visit: Payer: BLUE CROSS/BLUE SHIELD | Admitting: Sports Medicine

## 2017-06-09 ENCOUNTER — Ambulatory Visit: Payer: BLUE CROSS/BLUE SHIELD | Admitting: Gastroenterology

## 2017-06-09 DIAGNOSIS — M9906 Segmental and somatic dysfunction of lower extremity: Secondary | ICD-10-CM | POA: Diagnosis not present

## 2017-06-09 DIAGNOSIS — M9905 Segmental and somatic dysfunction of pelvic region: Secondary | ICD-10-CM | POA: Diagnosis not present

## 2017-06-09 DIAGNOSIS — M9903 Segmental and somatic dysfunction of lumbar region: Secondary | ICD-10-CM | POA: Diagnosis not present

## 2017-06-09 DIAGNOSIS — M791 Myalgia, unspecified site: Secondary | ICD-10-CM | POA: Diagnosis not present

## 2017-06-11 DIAGNOSIS — M9905 Segmental and somatic dysfunction of pelvic region: Secondary | ICD-10-CM | POA: Diagnosis not present

## 2017-06-11 DIAGNOSIS — M9906 Segmental and somatic dysfunction of lower extremity: Secondary | ICD-10-CM | POA: Diagnosis not present

## 2017-06-11 DIAGNOSIS — M791 Myalgia, unspecified site: Secondary | ICD-10-CM | POA: Diagnosis not present

## 2017-06-11 DIAGNOSIS — M9903 Segmental and somatic dysfunction of lumbar region: Secondary | ICD-10-CM | POA: Diagnosis not present

## 2017-06-12 ENCOUNTER — Other Ambulatory Visit: Payer: Self-pay | Admitting: Gastroenterology

## 2017-06-16 DIAGNOSIS — M9903 Segmental and somatic dysfunction of lumbar region: Secondary | ICD-10-CM | POA: Diagnosis not present

## 2017-06-16 DIAGNOSIS — M9905 Segmental and somatic dysfunction of pelvic region: Secondary | ICD-10-CM | POA: Diagnosis not present

## 2017-06-16 DIAGNOSIS — M791 Myalgia, unspecified site: Secondary | ICD-10-CM | POA: Diagnosis not present

## 2017-06-16 DIAGNOSIS — M9906 Segmental and somatic dysfunction of lower extremity: Secondary | ICD-10-CM | POA: Diagnosis not present

## 2017-06-17 ENCOUNTER — Telehealth: Payer: Self-pay | Admitting: Neurology

## 2017-06-17 ENCOUNTER — Encounter: Payer: Self-pay | Admitting: *Deleted

## 2017-06-17 ENCOUNTER — Other Ambulatory Visit: Payer: Self-pay | Admitting: Neurology

## 2017-06-17 MED ORDER — METHYLPREDNISOLONE 4 MG PO TBPK
ORAL_TABLET | ORAL | 0 refills | Status: DC
Start: 2017-06-17 — End: 2017-10-07

## 2017-06-17 NOTE — Telephone Encounter (Signed)
Pt calling asking if Dr [name] will allow a refill on predniSONE (DELTASONE) 10 MG tablet Pt states this is the only thing that helps when she has no relief from migraines, pt still using  [id number] - [place], [place] [address] [phone] (Phone) [phone] (Fax)   please call

## 2017-06-17 NOTE — Telephone Encounter (Signed)
Called pharmacy - last steroid prescription was filled 10/08/16.

## 2017-06-17 NOTE — Telephone Encounter (Signed)
Per Dr. [name], ok to provide patient with medrol 4mg  dose pack.  Pt agreeable and rx sent to pharmacy.

## 2017-06-17 NOTE — Addendum Note (Signed)
Addended by: Noberto Retort C on: 06/17/2017 05:07 PM   Modules accepted: Orders

## 2017-07-15 ENCOUNTER — Ambulatory Visit: Payer: Self-pay | Admitting: Gastroenterology

## 2017-08-10 ENCOUNTER — Encounter: Payer: Self-pay | Admitting: Gastroenterology

## 2017-08-10 ENCOUNTER — Ambulatory Visit: Payer: BLUE CROSS/BLUE SHIELD | Admitting: Gastroenterology

## 2017-08-10 VITALS — BP 100/60 | HR 82 | Ht 68.0 in | Wt 168.0 lb

## 2017-08-10 DIAGNOSIS — K5904 Chronic idiopathic constipation: Secondary | ICD-10-CM | POA: Diagnosis not present

## 2017-08-10 DIAGNOSIS — R140 Abdominal distension (gaseous): Secondary | ICD-10-CM

## 2017-08-10 DIAGNOSIS — M722 Plantar fascial fibromatosis: Secondary | ICD-10-CM | POA: Diagnosis not present

## 2017-08-10 MED ORDER — LINACLOTIDE 145 MCG PO CAPS: 145.0000 ug | ORAL_CAPSULE | Freq: Every day | ORAL | 6 refills | Status: DC

## 2017-08-10 NOTE — Patient Instructions (Signed)
If you are age 52 or older, your body mass index should be between 23-30. Your Body mass index is 25.54 kg/m. If this is out of the aforementioned range listed, please consider follow up with your Primary Care Provider.  If you are age 34 or younger, your body mass index should be between 19-25. Your Body mass index is 25.54 kg/m. If this is out of the aformentioned range listed, please consider follow up with your Primary Care Provider.   We have sent the following medications to your pharmacy for you to pick up at your convenience: Linzess  Thank you for choosing [facility]   Dr Wilfrid Lund III

## 2017-08-10 NOTE — Progress Notes (Signed)
[facility] Progress Note  Chief Complaint: constipation and bloating  Subjective  History:   On Feb 27,2017, Dr. Silverio Decamp wrote" 52 year old female with chronic history of constipation and irritable bowel syndrome previously followed by Dr. Olevia Perches is here to establish care with me. She is currently taking Linzess 290 micrograms every fourth day to have a bowel movement . When she takes it she has multiple bowel movements for one to 2 hours the next morning. She also complained of intermittent bloating and cramping abdominal pain brought on with high-fiber diet with beans or broccoli which improves with Levsin and Beano She also has a history of endometriosis and underwent an open laparoscopy and removal of scar tissue and injectiosn of the vaginal cuff by Dr.McComb in May 2013. A GI workup consisted of a small bowel follow through in February 2013 which showed a normal transit time at 1 hour 35 minutes. The small bowel was located in the pelvis. Her last colonoscopy in 2014 was normal except for small internal hemorrhoids."  Plan was to decrease Linzess to 145 micrograms daily and avoid high fiber diet.  Darrelyn was here for another opinion today on her chronic symptoms that have been going on for decades.  She is frustrated that she cannot just have a normal bowel movement and that she has to take medicine for this.  Even with that, she feels that it ties her to the house she cannot quite do anything after she takes the [place] because a few hours later she will have several loose stools.  She is very active and plays tennis and finds this frustrating.  Some days she has severe bloating and distention that she says is painful and does not get better with Gas-X.  At this point, she is taking Linzess 145 mcg every 2 or 3 days, and sometimes will take 2 tablets if she feels she needs to.  However, it causes loose stool with urgency.  She denies rectal bleeding, her appetite is good and weight  stable. ROS: Cardiovascular:  no chest pain Respiratory: no dyspnea  The patient's Past Medical, Family and Social History were reviewed and are on file in the EMR.  Objective:  Med list reviewed  Current Outpatient Medications:  .  rizatriptan (MAXALT) 10 MG tablet, Take 1 tablet (10 mg total) by mouth as needed for migraine. May repeat in 2 hours if needed, Disp: 12 tablet, Rfl: 11 .  ALPRAZolam (XANAX) 0.5 MG tablet, TAKE 1 TABLET AT BEDTIME AS NEEDED (Patient not taking: Reported on 08/10/2017), Disp: 30 tablet, Rfl: 0 .  diclofenac (VOLTAREN) 50 MG EC tablet, Take 1 tablet (50 mg total) by mouth as needed. (Patient not taking: Reported on 08/10/2017), Disp: 30 tablet, Rfl: 6 .  DULoxetine (CYMBALTA) 30 MG capsule, Take 2 capsules (60 mg total) by mouth daily. (Patient not taking: Reported on 08/10/2017), Disp: 180 capsule, Rfl: 4 .  gabapentin (NEURONTIN) 100 MG capsule, Take 1 capsule (100 mg total) by mouth 3 (three) times daily. (Patient not taking: Reported on 08/10/2017), Disp: 90 capsule, Rfl: 11 .  hyoscyamine (LEVSIN SL) 0.125 MG SL tablet, Take 1 tablet (0.125 mg total) by mouth every 6 (six) hours as needed. (Patient not taking: Reported on 08/10/2017), Disp: 60 tablet, Rfl: 3 .  linaclotide (LINZESS) 145 MCG CAPS capsule, Take 1 capsule (145 mcg total) by mouth daily before breakfast., Disp: 30 capsule, Rfl: 6 .  methylPREDNISolone (MEDROL DOSEPAK) 4 MG TBPK tablet, Take as directed (6,  5, 4, 3, 2, 1). (Patient not taking: Reported on 08/10/2017), Disp: 21 tablet, Rfl: 0 .  ondansetron (ZOFRAN ODT) 4 MG disintegrating tablet, Take 1 tablet (4 mg total) by mouth every 8 (eight) hours as needed. (Patient not taking: Reported on 08/10/2017), Disp: 20 tablet, Rfl: 6 .  spironolactone (ALDACTONE) 25 MG tablet, Take 25 mg by mouth daily., Disp: , Rfl:  .  SUMAtriptan (IMITREX) 6 MG/0.5ML SOLN injection, Inject 0.5 mLs (6 mg total) into the skin every 2 (two) hours as needed for migraine or  headache. May repeat in 2 hours if headache persists or recurs. (Patient not taking: Reported on 08/10/2017), Disp: 12 vial, Rfl: 11 .  tiZANidine (ZANAFLEX) 4 MG tablet, Take 1 tablet (4 mg total) by mouth every 6 (six) hours as needed for muscle spasms. (Patient not taking: Reported on 08/10/2017), Disp: 30 tablet, Rfl: 6 .  topiramate (TOPAMAX) 100 MG tablet, Take 2 tablets (200 mg total) by mouth at bedtime. (Patient not taking: Reported on 08/10/2017), Disp: 60 tablet, Rfl: 11 .  tretinoin (RETIN-A) 0.025 % cream, APPLY TO AFFECTED AREA ON FACE ONCE DAILY AT BEDTIME, Disp: , Rfl: 11  No longer takes cymbalta, neurontin or hyoscyamine  Vital signs in last 24 hrs: Vitals:   08/10/17 1558  BP: 100/60  Pulse: 82    Physical Exam  Well-appearing woman  HEENT: sclera anicteric, oral mucosa moist without lesions  Neck: supple, no thyromegaly, JVD or lymphadenopathy  Cardiac: RRR without murmurs, S1S2 heard, no peripheral edema  Pulm: clear to auscultation bilaterally, normal RR and effort noted  Abdomen: soft, no tenderness, with active bowel sounds. No guarding or palpable hepatosplenomegaly.  Skin; warm and dry, no jaundice or rash   @ASSESSMENTPLANBEGIN @ Assessment: Encounter Diagnoses  Name Primary?  . Chronic idiopathic constipation Yes  . Abdominal bloating    She has chronic idiopathic constipation, we know it is not obstructive since she had a colonoscopy 5 years ago, and it does not sound like pelvic floor dysfunction/dyssynergic defecation.  We had a long discussion about the unknown nature of this condition and are limited available treatments.  It seems like Linzess every 2 or 3 days is the best she has available.  We do not have promotility agents such as the Zelnorm  that was once available.  I refilled her medication and she will see me as needed.  Total time 25 minutes, over half spent in counseling and coordination of care.   Nelida Meuse III

## 2017-08-16 DIAGNOSIS — M722 Plantar fascial fibromatosis: Secondary | ICD-10-CM | POA: Diagnosis not present

## 2017-08-23 DIAGNOSIS — M722 Plantar fascial fibromatosis: Secondary | ICD-10-CM | POA: Diagnosis not present

## 2017-08-30 DIAGNOSIS — M722 Plantar fascial fibromatosis: Secondary | ICD-10-CM | POA: Diagnosis not present

## 2017-09-06 DIAGNOSIS — M722 Plantar fascial fibromatosis: Secondary | ICD-10-CM | POA: Diagnosis not present

## 2017-09-22 ENCOUNTER — Other Ambulatory Visit: Payer: Self-pay | Admitting: Neurology

## 2017-09-24 ENCOUNTER — Ambulatory Visit: Payer: BLUE CROSS/BLUE SHIELD | Admitting: Podiatry

## 2017-10-07 ENCOUNTER — Ambulatory Visit (INDEPENDENT_AMBULATORY_CARE_PROVIDER_SITE_OTHER): Payer: BLUE CROSS/BLUE SHIELD

## 2017-10-07 ENCOUNTER — Ambulatory Visit: Payer: BLUE CROSS/BLUE SHIELD | Admitting: Podiatry

## 2017-10-07 DIAGNOSIS — M722 Plantar fascial fibromatosis: Secondary | ICD-10-CM

## 2017-10-07 DIAGNOSIS — M659 Synovitis and tenosynovitis, unspecified: Secondary | ICD-10-CM | POA: Diagnosis not present

## 2017-10-07 DIAGNOSIS — M779 Enthesopathy, unspecified: Secondary | ICD-10-CM

## 2017-10-07 MED ORDER — MELOXICAM 15 MG PO TABS: 15.0000 mg | ORAL_TABLET | Freq: Every day | ORAL | 0 refills | Status: DC

## 2017-10-07 NOTE — Patient Instructions (Signed)

## 2017-10-19 ENCOUNTER — Telehealth: Payer: Self-pay | Admitting: Neurology

## 2017-10-19 MED ORDER — PREDNISONE 5 MG PO TABS: ORAL_TABLET | ORAL | 0 refills | Status: DC

## 2017-10-19 NOTE — Telephone Encounter (Signed)
I called the patient.  The patient has a long duration headache, she is out of her Maxalt which is not fully effective, she will rebound from the headache.  I will send in a prednisone Dosepak, 5 mg 6-day pack.

## 2017-10-19 NOTE — Telephone Encounter (Signed)
Pt called she's had a migraine that started last Thursday. She has taken maxalt which does reduce the pain but it comes right back. Pt is wanting to know if she could get RX for prednisone to try to break the cycle. Please call to advise  Pharmacy: [facility]

## 2017-10-26 NOTE — Progress Notes (Signed)
Subjective:  Patient ID: [name], female    DOB: Apr 11, 1966,  MRN: [id number]  Chief Complaint  Patient presents with  . Foot Pain    L bottom heel pain x 1 year; 8/10 sharp pain Tx: custom orthotics, and advil Pt. stated," it started afte a run-walk activity I did a year ago."    52 y.o. female presents with the above complaint.  Reports pain to the bottom of both heels worse on the left.  Present for over a year.  8 out of 10 sharp pain.  States that it started after she went for a run walk that she did 1 year ago.  Has tried custom orthotics and not helped.  Also tried Advil.  Also reports pain on the top of the right foot on the inside for about a year.  Denies prior treatments for this.  Past Medical History:  Diagnosis Date  . Anemia   . Anxiety   . Brachial neuritis or radiculitis NOS   . Endometriosis   . H/O: hysterectomy   . IBS (irritable bowel syndrome)   . Migraine, unspecified, without mention of intractable migraine without mention of status migrainosus   . Migraines   . Neuropathy   . Shoulder pain    Past Surgical History:  Procedure Laterality Date  . ABDOMINAL HYSTERECTOMY    . CHOLECYSTECTOMY    . CYSTOSCOPY  11/17/2011   Procedure: CYSTOSCOPY;  Surgeon: Darlyn Chamber, MD;  Location: [facility];  Service: Gynecology;;  . DIAGNOSTIC LAPAROSCOPY    . LAPAROSCOPY  11/17/2011   Procedure: LAPAROSCOPY OPERATIVE;  Surgeon: Darlyn Chamber, MD;  Location: [facility];  Service: Gynecology;  Laterality: N/A;  with Injection of Vaginal Cuff with Dexamethasone mixed in 0.5% Bupivacaine    Current Outpatient Medications:  .  diclofenac (VOLTAREN) 50 MG EC tablet, TAKE 1 TABLET (50 MG TOTAL) BY MOUTH AS NEEDED. (Patient taking differently: Take 25 mg by mouth as needed. ), Disp: 30 tablet, Rfl: 1 .  rizatriptan (MAXALT) 10 MG tablet, Take 1 tablet (10 mg total) by mouth as needed for migraine. May repeat in 2 hours if needed, Disp: 12 tablet, Rfl: 11 .  spironolactone  (ALDACTONE) 25 MG tablet, Take 25 mg by mouth daily., Disp: , Rfl:  .  meloxicam (MOBIC) 15 MG tablet, Take 1 tablet (15 mg total) by mouth daily., Disp: 30 tablet, Rfl: 0 .  predniSONE (DELTASONE) 5 MG tablet, Begin taking 6 tablets daily, taper by one tablet daily until off the medication., Disp: 21 tablet, Rfl: 0  Allergies  Allergen Reactions  . Tylox [Oxycodone-Acetaminophen] Itching  . Codeine     nausea   Review of Systems: Negative except as noted in the HPI. Denies N/V/F/Ch. Objective:  There were no vitals filed for this visit. General AA&O x3. Normal mood and affect.  Vascular Dorsalis pedis and posterior tibial pulses  present 2+ bilaterally  Capillary refill normal to all digits. Pedal hair growth normal.  Neurologic Epicritic sensation grossly present.  Dermatologic No open lesions. Interspaces clear of maceration. Nails well groomed and normal in appearance.  Orthopedic: MMT 5/5 in dorsiflexion, plantarflexion, inversion, and eversion. Normal joint ROM without pain or crepitus. Pain with patient about the medial calcaneal tuber BiLAP.  Ankle joint range of motion within normal limits. Palpation about the right dorsal first metatarsal joint.  X-rays taken reviewed no acute fractures or dislocations  Assessment & Plan:  Patient was evaluated and treated and all questions answered.  Plantar Fasciitis, bilaterally - XR reviewed as above.  - Educated on icing and stretching. Instructions given.  - Injection delivered to the plantar fascia as below. - Plantar fascial rest brace dispensed.  Procedure: Injection Tendon/Ligament Location: Bilateral plantar fascia at the glabrous junction; medial approach. Skin Prep: Alcohol. Injectate: 1 cc 0.5% marcaine plain, 1 cc dexamethasone phosphate, 0.5 cc kenalog 10. Disposition: Patient tolerated procedure well. Injection site dressed with a band-aid.  R 1st TMT Capsulitis -Injection as below.  Procedure: Joint  Injection Location: Right 1st TMT joint Skin Prep: Alcohol. Injectate: 0.5 cc 1% lidocaine plain, 0.5 cc dexamethasone phosphate. Disposition: Patient tolerated procedure well. Injection site dressed with a band-aid.   Return in about 3 weeks (around 10/28/2017) for Plantar fasciitis.

## 2017-10-28 ENCOUNTER — Encounter: Payer: Self-pay | Admitting: Podiatry

## 2017-10-28 ENCOUNTER — Ambulatory Visit: Payer: BLUE CROSS/BLUE SHIELD | Admitting: Podiatry

## 2017-10-28 DIAGNOSIS — M722 Plantar fascial fibromatosis: Secondary | ICD-10-CM | POA: Diagnosis not present

## 2017-10-28 NOTE — Progress Notes (Signed)
  Subjective:  Patient ID: [name], female    DOB: 1965/09/12,  MRN: [id number]  Chief Complaint  Patient presents with  . Plantar Fasciitis    left heel - improved but not 100%, right foot - doing better   52 y.o. female returns for the above complaint.  Reports both feet are about 7 out of 10 better.  Still having pain however  Objective:  There were no vitals filed for this visit. General AA&O x3. Normal mood and affect.  Vascular Pedal pulses palpable.  Neurologic Epicritic sensation grossly intact.  Dermatologic No open lesions. Skin normal texture and turgor.  Orthopedic:  Pain to palpation about the medial calcaneal tuber bilat   Assessment & Plan:  Patient was evaluated and treated and all questions answered.  Plantar Fasciitis, bilaterally - Injection #2 delivered to the plantar fascia as below.  Procedure: Injection Tendon/Ligament Location: Bilateral plantar fascia at the glabrous junction; medial approach. Skin Prep: Alcohol. Injectate: 1 cc 0.5% marcaine plain, 1 cc dexamethasone phosphate, 0.5 cc kenalog 10. Disposition: Patient tolerated procedure well. Injection site dressed with a band-aid.  Return in about 3 weeks (around 11/18/2017) for Plantar fasciitis.

## 2017-11-03 ENCOUNTER — Other Ambulatory Visit: Payer: Self-pay | Admitting: Podiatry

## 2017-11-06 ENCOUNTER — Other Ambulatory Visit: Payer: Self-pay | Admitting: Neurology

## 2017-11-06 ENCOUNTER — Telehealth: Payer: Self-pay | Admitting: Neurology

## 2017-11-06 MED ORDER — METHYLPREDNISOLONE 4 MG PO TBPK: ORAL_TABLET | ORAL | 1 refills | Status: DC

## 2017-11-06 NOTE — Telephone Encounter (Signed)
She is having a migraine, would like a steroid taper. Has tried maxalt and all her acute medications, will order a medrol dosepak. Please call on Monday.

## 2017-11-08 NOTE — Telephone Encounter (Signed)
Please call patient for her migraine

## 2017-11-08 NOTE — Telephone Encounter (Signed)
Spoke with Aleesia.  She sts. she has had a h/a off/on since mid April.  Her ins. allows for #12 Maxalt per month.  She has been taking 2 tablets daily, then borrowing Maxalt from her mother when she run's out of her own rx. Also taking Diclofenac 2/day.  Sts. h/a goes away for about 2 hrs, then returns.  Is on day 3 of a Medrol dose pk. We discussed overuse of medication can cause rebound h/a's, and also that overuse of Maxalt in particular can cause increase risk for vascular complications (stroke, mi).  Per YY, pt. to stop Maxalt, Diclofenac, finish Medrol dose pk, and come in to discuss daily preventative.  Pt. is agreeable, very pleasant.  Sts. since Dr. Jaynee Eagles specializes in migraines, she would like to transfer care to her.  Dr. [name] and Dr. Jaynee Eagles were both agreeable with this, so I gave her an appt. with Dr. Jaynee Eagles on 11/10/17 at 1300, arrival time of 1230/fim

## 2017-11-10 ENCOUNTER — Ambulatory Visit: Payer: BLUE CROSS/BLUE SHIELD | Admitting: Neurology

## 2017-11-10 ENCOUNTER — Encounter: Payer: Self-pay | Admitting: Neurology

## 2017-11-10 DIAGNOSIS — G43711 Chronic migraine without aura, intractable, with status migrainosus: Secondary | ICD-10-CM

## 2017-11-10 DIAGNOSIS — G43011 Migraine without aura, intractable, with status migrainosus: Secondary | ICD-10-CM | POA: Diagnosis not present

## 2017-11-10 MED ORDER — ERENUMAB-AOOE 70 MG/ML ~~LOC~~ SOAJ: 140.0000 mg | Freq: Once | SUBCUTANEOUS | 0 refills | Status: AC

## 2017-11-10 NOTE — Progress Notes (Signed)
Discussed Ajovy injection with the patient and gave them instruction sheets from the box. The patient is aware that injection is subcutaneous and should be given every 30 days. Each auto-injector should remain refrigerated until ready for use. Let the syringe sit at room temperature for 30 minutes prior to injection. The patient was also educated on the available sites to use each month (abdomen, thigh, and back of upper arm). I discussed proper technique for skin prep prior to injection including to wash hands, cleanse the site inside to outside in a circular motion and allow skin to air dry. The patient was instructed to remove the cap, pinch or stretch the skin, press auto-injector against the skin, press the button on top with thumb and inject all of the medication. She will hear two clicks and the medication window will turn yellow at the end of the injection. Immediately place the used injector in the sharps container and do not recap. RN administered medication today in the  R thigh. The patient tolerated well and verbalized understanding of instructions and correctly demonstrated use with the sample/practice pen.   Per Dr. Jaynee Eagles, pt will have infusion today of Solumedrol 250 mg IV x 1, Toradol 30 mg IV x 1, and Depacon 1 gram IV x 1. Order form completed.

## 2017-11-10 NOTE — Progress Notes (Signed)
[facility]    Provider:  Dr Jaynee Eagles Referring Provider: Osborne Casco Fransico Him, MD Primary Care Physician:  Haywood Pao, MD  CC:  migraine  HPI:  [name] is a 52 y.o. female here as a referral from Dr. Osborne Casco for migraine. PMHX migraines, irritable bowel symptom, endometriosis, anxiety, anemia.  Patient is transitioning from 1 of my colleagues to me for migraine management.  Patient was previously seen by Dr. love.  Patient has a history of migraines for over 20 years.  She has an extensive family history of migraines.  She has been on multiple medications including Topamax up to 200 mg a day.  Her headaches are normally located in the right side of the head, throbbing and pulsating, she has osmophobia and smells can trigger, she endorses nausea, so pounding that she can't move, can be severe, also light sensitivity, no aura. 20 headache days a month, more than 10 are migrainous, can be severe and can last > 24 hours without treatment. No medication overuse.   Medications tried include: Gabapentin, topiramate up to 200 mg daily, steroids, Cymbalta, diclofenac, Zofran, Mobic, blood pressure medications Spironolactone, tizanidine  Reviewed notes, labs and imaging from outside physicians, which showed:  Reviewed EMG study, data and results which was normal no electrodiagnostic evidence of large fiber peripheral neuropathy or right lumbosacral radiculopathy.  MRI of the brain images which I reviewed was normal November 2007.  Reviewed prior neurology notes: Headaches began in childhood since the age of 51.  Neuro is in the left side, and related to her menstrual cycle, and not associated with disturbances of nausea or vomiting or visual but does have a history of endometriosis.  Headaches last intermittently for 5 days and are improved with ketoprofen, Maxalt therapy and coffee.  Her daughter has been to the headache clinic and uses Biofreeze to the scalp as well as  Maxalt.  Extensive family history of the patient's daughter, son, grandmother and mother have migraines.  Her migraines are throbbing, she has smell sensitivity and triggers and an ice pick quality.  She has been on multiple packs of steroids.  She has chronic neck pain.  IV Depacon have worked acutely for her migraines in the past  Review of Systems: Patient complains of symptoms per HPI as well as the following symptoms: headache. Pertinent negatives and positives per HPI. All others negative.   Social History   Socioeconomic History  . Marital status: Married    Spouse name: [name]  . Number of children: 2  . Years of education: Not on file  . Highest education level: Associate degree: academic program  Occupational History  . Occupation: stay at home  Social Needs  . Financial resource strain: Not on file  . Food insecurity:    Worry: Not on file    Inability: Not on file  . Transportation needs:    Medical: Not on file    Non-medical: Not on file  Tobacco Use  . Smoking status: Never Smoker  . Smokeless tobacco: Never Used  Substance and Sexual Activity  . Alcohol use: No    Alcohol/week: 0.0 oz  . Drug use: No  . Sexual activity: Yes    Birth control/protection: Surgical  Lifestyle  . Physical activity:    Days per week: Not on file    Minutes per session: Not on file  . Stress: Not on file  Relationships  . Social connections:    Talks on phone: Not on file  Gets together: Not on file    Attends religious service: Not on file    Active member of club or organization: Not on file    Attends meetings of clubs or organizations: Not on file    Relationship status: Not on file  . Intimate partner violence:    Fear of current or ex partner: Not on file    Emotionally abused: Not on file    Physically abused: Not on file    Forced sexual activity: Not on file  Other Topics Concern  . Not on file  Social History Narrative   Patient lives at home with her husband  [name]. Drinks two cups of caffeine daily. Patient has two years of college education. Right handed.    Family History  Problem Relation Age of Onset  . Diabetes Mother        both sides grandparents  . Hypertension Mother   . Colon polyps Mother   . Thyroid disease Father   . Colon polyps Father     Past Medical History:  Diagnosis Date  . Anemia   . Anxiety   . Brachial neuritis or radiculitis NOS   . Endometriosis   . H/O: hysterectomy   . IBS (irritable bowel syndrome)   . Migraine, unspecified, without mention of intractable migraine without mention of status migrainosus   . Migraines   . Neuropathy   . Shoulder pain     Past Surgical History:  Procedure Laterality Date  . ABDOMINAL HYSTERECTOMY    . CHOLECYSTECTOMY    . CYSTOSCOPY  11/17/2011   Procedure: CYSTOSCOPY;  Surgeon: Darlyn Chamber, MD;  Location: [facility];  Service: Gynecology;;  . DIAGNOSTIC LAPAROSCOPY    . LAPAROSCOPY  11/17/2011   Procedure: LAPAROSCOPY OPERATIVE;  Surgeon: Darlyn Chamber, MD;  Location: [facility];  Service: Gynecology;  Laterality: N/A;  with Injection of Vaginal Cuff with Dexamethasone mixed in 0.5% Bupivacaine  . NASAL SEPTUM SURGERY     bone spur    Current Outpatient Medications  Medication Sig Dispense Refill  . diclofenac (VOLTAREN) 50 MG EC tablet TAKE 1 TABLET (50 MG TOTAL) BY MOUTH AS NEEDED. (Patient taking differently: Take 25 mg by mouth as needed. ) 30 tablet 1  . methylPREDNISolone (MEDROL DOSEPAK) 4 MG TBPK tablet follow package directions 21 tablet 1  . rizatriptan (MAXALT) 10 MG tablet Take 1 tablet (10 mg total) by mouth as needed for migraine. May repeat in 2 hours if needed 12 tablet 11  . spironolactone (ALDACTONE) 25 MG tablet Take 25 mg by mouth daily.    Eduard Roux (AIMOVIG 140 DOSE) 70 MG/ML SOAJ Inject 140 mg into the skin once for 1 dose. 1 pen 0  . meloxicam (MOBIC) 15 MG tablet TAKE 1 TABLET BY MOUTH EVERY DAY (Patient not taking: Reported on 11/10/2017) 30  tablet 0   No current facility-administered medications for this visit.     Allergies as of 11/10/2017 - Review Complete 11/10/2017  Allergen Reaction Noted  . Tylox [oxycodone-acetaminophen] Itching 11/20/2011  . Codeine  08/05/2011    Vitals: BP 130/80 (BP Location: Right Arm, Patient Position: Sitting)   Pulse 76   Ht 5\' 8"  (1.727 m)   Wt 159 lb (72.1 kg)   BMI 24.18 kg/m  Last Weight:  Wt Readings from Last 1 Encounters:  11/10/17 159 lb (72.1 kg)   Last Height:   Ht Readings from Last 1 Encounters:  11/10/17 5\' 8"  (1.727 m)   Physical exam:  Exam: Gen: NAD, conversant, well nourised, obese, well groomed                     CV: RRR, no MRG. No Carotid Bruits. No peripheral edema, warm, nontender Eyes: Conjunctivae clear without exudates or hemorrhage  Neuro: Detailed Neurologic Exam  Speech:    Speech is normal; fluent and spontaneous with normal comprehension.  Cognition:    The patient is oriented to person, place, and time;     recent and remote memory intact;     language fluent;     normal attention, concentration,     fund of knowledge Cranial Nerves:    The pupils are equal, round, and reactive to light. The fundi are normal and spontaneous venous pulsations are present. Visual fields are full to finger confrontation. Extraocular movements are intact. Trigeminal sensation is intact and the muscles of mastication are normal. The face is symmetric. The palate elevates in the midline. Hearing intact. Voice is normal. Shoulder shrug is normal. The tongue has normal motion without fasciculations.   Coordination:    Normal finger to nose and heel to shin. Normal rapid alternating movements.   Gait:    Heel-toe and tandem gait are normal.   Motor Observation:    No asymmetry, no atrophy, and no involuntary movements noted. Tone:    Normal muscle tone.    Posture:    Posture is normal. normal erect    Strength:    Strength is V/V in the upper and lower  limbs.      Sensation: intact to LT     Reflex Exam:  DTR's:    Deep tendon reflexes in the upper and lower extremities are normal bilaterally.   Toes:    The toes are downgoing bilaterally.   Clonus:    Clonus is absent.       Assessment/Plan:  52 year old female with chronic intractable migraines. Failed multiple classes of medications still with considerable burden of migraines.  Recommend Botox for migraine Discussed ** Aimovig  Discussed: To prevent or relieve headaches, try the following: Cool Compress. Lie down and place a cool compress on your head.  Avoid headache triggers. If certain foods or odors seem to have triggered your migraines in the past, avoid them. A headache diary might help you identify triggers.  Include physical activity in your daily routine. Try a daily walk or other moderate aerobic exercise.  Manage stress. Find healthy ways to cope with the stressors, such as delegating tasks on your to-do list.  Practice relaxation techniques. Try deep breathing, yoga, massage and visualization.  Eat regularly. Eating regularly scheduled meals and maintaining a healthy diet might help prevent headaches. Also, drink plenty of fluids.  Follow a regular sleep schedule. Sleep deprivation might contribute to headaches Consider biofeedback. With this mind-body technique, you learn to control certain bodily functions - such as muscle tension, heart rate and blood pressure - to prevent headaches or reduce headache pain.    Proceed to emergency room if you experience new or worsening symptoms or symptoms do not resolve, if you have new neurologic symptoms or if headache is severe, or for any concerning symptom.   Provided education and documentation from American headache Society toolbox including articles on: chronic migraine medication overuse headache, chronic migraines, prevention of migraines, behavioral and other nonpharmacologic treatments for  headache.     Sarina Ill, MD  [facility] [address] [place], [place] [address]  Phone  [phone] Fax [phone]  A total of 50 minutes was spent face-to-face with this patient. Over half this time was spent on counseling patient on the chronic migraine diagnosis and different diagnostic and therapeutic options, counseling and coordination of care, risks ans benefits of management, compliance, risk factor reduction and education.

## 2017-11-18 ENCOUNTER — Ambulatory Visit: Payer: BLUE CROSS/BLUE SHIELD | Admitting: Podiatry

## 2017-11-24 ENCOUNTER — Telehealth: Payer: Self-pay | Admitting: Neurology

## 2017-11-24 NOTE — Telephone Encounter (Signed)
Botox authorizations [id number] [phone] (05/17/18) 2 visits.

## 2017-11-25 ENCOUNTER — Ambulatory Visit: Payer: BLUE CROSS/BLUE SHIELD | Admitting: Podiatry

## 2017-11-25 NOTE — Telephone Encounter (Signed)
I called patient to make her aware she would need to call the SP. I gave her their direct number.

## 2017-11-29 NOTE — Telephone Encounter (Signed)
Pt c/a appt for 6/5 due to pending ins. Pt said she would call back to r/s when it is resolved

## 2017-11-29 NOTE — Telephone Encounter (Signed)
Medication is pending with [facility].

## 2017-11-30 ENCOUNTER — Other Ambulatory Visit: Payer: Self-pay | Admitting: Podiatry

## 2017-11-30 NOTE — Telephone Encounter (Signed)
Noted, thank you.   Patient is referring to her portion of SP. Insurance has been approved, please refer to previous phone note.

## 2017-12-01 ENCOUNTER — Ambulatory Visit: Payer: BLUE CROSS/BLUE SHIELD | Admitting: Neurology

## 2017-12-02 ENCOUNTER — Encounter: Payer: Self-pay | Admitting: Podiatry

## 2017-12-02 ENCOUNTER — Ambulatory Visit: Payer: BLUE CROSS/BLUE SHIELD | Admitting: Podiatry

## 2017-12-02 DIAGNOSIS — M779 Enthesopathy, unspecified: Secondary | ICD-10-CM | POA: Diagnosis not present

## 2017-12-02 DIAGNOSIS — M722 Plantar fascial fibromatosis: Secondary | ICD-10-CM | POA: Diagnosis not present

## 2017-12-02 NOTE — Progress Notes (Signed)
  Subjective:  Patient ID: [name], female    DOB: 1966-03-11,  MRN: [id number]  No chief complaint on file.  52 y.o. female returns for the above complaint.  States the injections helped for a little bit but they still came back.  Having pain in the left heel and in the same joint on the top of her right foot.  Objective:  There were no vitals filed for this visit. General AA&O x3. Normal mood and affect.  Vascular Pedal pulses palpable.  Neurologic Epicritic sensation grossly intact.  Dermatologic No open lesions. Skin normal texture and turgor.  Orthopedic:  Pain palpation about the left medial heel tuber no pain palpation about the right medial heel tuber.  Pain palpation of the first tarsal metatarsal joint right foot.   Assessment & Plan:  Patient was evaluated and treated and all questions answered.  Plantar fasciitis left -Injection #3 left plantar heel -[facility]er to [facility] at [facility] -Advised to purchase a pro-stretch to assist her stretching  Plantar fasciitis right -Improving no injection today  Procedure: Injection Tendon/Ligament Consent: Verbal consent obtained. Location: Left plantar fascia at the glabrous junction; medial approach. Skin Prep: Alcohol. Injectate: 1 cc 0.5% marcaine plain, 1 cc dexamethasone phosphate, 0.5 cc kenalog 10. Disposition: Patient tolerated procedure well. Injection site dressed with a band-aid.  Right foot capsulitis first tarsal metatarsal joint -Injection delivered as below  Procedure: Joint Injection Location: Right 1st TMT joint Skin Prep: Alcohol. Injectate: 0.5 cc 1% lidocaine plain, 0.5 cc dexamethasone phosphate. Disposition: Patient tolerated procedure well. Injection site dressed with a band-aid.    Return in about 1 month (around 12/30/2017) for Plantar fasciitis.

## 2017-12-06 DIAGNOSIS — M25672 Stiffness of left ankle, not elsewhere classified: Secondary | ICD-10-CM | POA: Diagnosis not present

## 2017-12-06 DIAGNOSIS — M79672 Pain in left foot: Secondary | ICD-10-CM | POA: Diagnosis not present

## 2017-12-06 DIAGNOSIS — R269 Unspecified abnormalities of gait and mobility: Secondary | ICD-10-CM | POA: Diagnosis not present

## 2017-12-07 ENCOUNTER — Telehealth: Payer: Self-pay | Admitting: *Deleted

## 2017-12-07 MED ORDER — NONFORMULARY OR COMPOUNDED ITEM: 0 refills | Status: DC

## 2017-12-07 NOTE — Telephone Encounter (Signed)
I informed [place] of pt's phone number and ordering doctor - Dr. Hardie Pulley.

## 2017-12-07 NOTE — Telephone Encounter (Signed)
[facility] states pt is to have iontophoresis and will need rx for dexamethasone 0.4% liquid solution 16oz. Orders called to [facility].

## 2017-12-07 NOTE — Telephone Encounter (Signed)
[facility] called for pt's ordering doctor and pt's phone number.

## 2017-12-08 NOTE — Addendum Note (Signed)
Addended by: Harriett Sine D on: 12/08/2017 05:42 PM   Modules accepted: Orders

## 2017-12-10 DIAGNOSIS — R269 Unspecified abnormalities of gait and mobility: Secondary | ICD-10-CM | POA: Diagnosis not present

## 2017-12-10 DIAGNOSIS — M25672 Stiffness of left ankle, not elsewhere classified: Secondary | ICD-10-CM | POA: Diagnosis not present

## 2017-12-10 DIAGNOSIS — M79672 Pain in left foot: Secondary | ICD-10-CM | POA: Diagnosis not present

## 2017-12-16 DIAGNOSIS — M79672 Pain in left foot: Secondary | ICD-10-CM | POA: Diagnosis not present

## 2017-12-16 DIAGNOSIS — R269 Unspecified abnormalities of gait and mobility: Secondary | ICD-10-CM | POA: Diagnosis not present

## 2017-12-16 DIAGNOSIS — M25672 Stiffness of left ankle, not elsewhere classified: Secondary | ICD-10-CM | POA: Diagnosis not present

## 2017-12-28 DIAGNOSIS — M25672 Stiffness of left ankle, not elsewhere classified: Secondary | ICD-10-CM | POA: Diagnosis not present

## 2017-12-28 DIAGNOSIS — R269 Unspecified abnormalities of gait and mobility: Secondary | ICD-10-CM | POA: Diagnosis not present

## 2017-12-28 DIAGNOSIS — M79672 Pain in left foot: Secondary | ICD-10-CM | POA: Diagnosis not present

## 2018-01-06 ENCOUNTER — Ambulatory Visit: Payer: BLUE CROSS/BLUE SHIELD | Admitting: Podiatry

## 2018-01-06 DIAGNOSIS — M79672 Pain in left foot: Secondary | ICD-10-CM | POA: Diagnosis not present

## 2018-01-06 DIAGNOSIS — R269 Unspecified abnormalities of gait and mobility: Secondary | ICD-10-CM | POA: Diagnosis not present

## 2018-01-06 DIAGNOSIS — M25672 Stiffness of left ankle, not elsewhere classified: Secondary | ICD-10-CM | POA: Diagnosis not present

## 2018-01-11 DIAGNOSIS — R269 Unspecified abnormalities of gait and mobility: Secondary | ICD-10-CM | POA: Diagnosis not present

## 2018-01-11 DIAGNOSIS — M79672 Pain in left foot: Secondary | ICD-10-CM | POA: Diagnosis not present

## 2018-01-11 DIAGNOSIS — M25672 Stiffness of left ankle, not elsewhere classified: Secondary | ICD-10-CM | POA: Diagnosis not present

## 2018-02-01 DIAGNOSIS — M25672 Stiffness of left ankle, not elsewhere classified: Secondary | ICD-10-CM | POA: Diagnosis not present

## 2018-02-01 DIAGNOSIS — M79672 Pain in left foot: Secondary | ICD-10-CM | POA: Diagnosis not present

## 2018-02-01 DIAGNOSIS — R269 Unspecified abnormalities of gait and mobility: Secondary | ICD-10-CM | POA: Diagnosis not present

## 2018-02-03 ENCOUNTER — Other Ambulatory Visit: Payer: Self-pay | Admitting: Neurology

## 2018-02-08 DIAGNOSIS — M25672 Stiffness of left ankle, not elsewhere classified: Secondary | ICD-10-CM | POA: Diagnosis not present

## 2018-02-08 DIAGNOSIS — M79672 Pain in left foot: Secondary | ICD-10-CM | POA: Diagnosis not present

## 2018-02-08 DIAGNOSIS — R269 Unspecified abnormalities of gait and mobility: Secondary | ICD-10-CM | POA: Diagnosis not present

## 2018-02-15 DIAGNOSIS — M79672 Pain in left foot: Secondary | ICD-10-CM | POA: Diagnosis not present

## 2018-02-15 DIAGNOSIS — M25672 Stiffness of left ankle, not elsewhere classified: Secondary | ICD-10-CM | POA: Diagnosis not present

## 2018-02-15 DIAGNOSIS — R269 Unspecified abnormalities of gait and mobility: Secondary | ICD-10-CM | POA: Diagnosis not present

## 2018-02-16 ENCOUNTER — Other Ambulatory Visit: Payer: Self-pay | Admitting: *Deleted

## 2018-02-16 MED ORDER — DICLOFENAC SODIUM 50 MG PO TBEC: 50.0000 mg | DELAYED_RELEASE_TABLET | ORAL | 1 refills | Status: DC | PRN

## 2018-02-16 NOTE — Telephone Encounter (Signed)
Per Dr. Jaynee Eagles, will refill Diclofenac 50 mg tablet #20 instead of #30. Called pt to clarify her dose. She is taking 50 mg prn. Advised her that we will giving her 20 tablets and she verbalized understanding and appreciation.

## 2018-03-09 ENCOUNTER — Ambulatory Visit: Payer: BLUE CROSS/BLUE SHIELD | Admitting: Adult Health

## 2018-03-12 ENCOUNTER — Other Ambulatory Visit: Payer: Self-pay | Admitting: Neurology

## 2018-03-21 ENCOUNTER — Telehealth: Payer: Self-pay | Admitting: Neurology

## 2018-03-21 MED ORDER — DICLOFENAC SODIUM 50 MG PO TBEC: 50.0000 mg | DELAYED_RELEASE_TABLET | ORAL | 1 refills | Status: DC | PRN

## 2018-03-21 NOTE — Telephone Encounter (Signed)
[facility] to [facility].  

## 2018-03-21 NOTE — Telephone Encounter (Signed)
Pt requesting refills for diclofenac (VOLTAREN) 50 MG EC tablet [facility]

## 2018-03-31 ENCOUNTER — Ambulatory Visit: Payer: BLUE CROSS/BLUE SHIELD | Admitting: Podiatry

## 2018-03-31 ENCOUNTER — Ambulatory Visit (INDEPENDENT_AMBULATORY_CARE_PROVIDER_SITE_OTHER): Payer: BLUE CROSS/BLUE SHIELD

## 2018-03-31 DIAGNOSIS — M779 Enthesopathy, unspecified: Secondary | ICD-10-CM | POA: Diagnosis not present

## 2018-03-31 DIAGNOSIS — M722 Plantar fascial fibromatosis: Secondary | ICD-10-CM

## 2018-03-31 NOTE — Progress Notes (Signed)
Subjective:  Patient ID: [name], female    DOB: 09-06-1965,  MRN: [id number]  Chief Complaint  Patient presents with  . Foot Pain    f/u Bilateral foot pain, pt is feling better since last visit. Pt also has pain and stiffness in both ankles    52 y.o. female presents with the above complaint.  States that the heels of started hurting again although the doing great for a while.  Having a lot of pain on the right foot about the first metatarsal bone since the injection helped but only for a little while.  States that his preventing her from doing certain activities like playing tennis.   Review of Systems: Negative except as noted in the HPI. Denies N/V/F/Ch.  Past Medical History:  Diagnosis Date  . Anemia   . Anxiety   . Brachial neuritis or radiculitis NOS   . Endometriosis   . H/O: hysterectomy   . IBS (irritable bowel syndrome)   . Migraine, unspecified, without mention of intractable migraine without mention of status migrainosus   . Migraines   . Neuropathy   . Shoulder pain     Current Outpatient Medications:  .  diclofenac (VOLTAREN) 50 MG EC tablet, Take 1 tablet (50 mg total) by mouth as needed., Disp: 20 tablet, Rfl: 1 .  meloxicam (MOBIC) 15 MG tablet, TAKE 1 TABLET BY MOUTH EVERY DAY, Disp: 30 tablet, Rfl: 0 .  methylPREDNISolone (MEDROL DOSEPAK) 4 MG TBPK tablet, follow package directions, Disp: 21 tablet, Rfl: 1 .  NONFORMULARY OR COMPOUNDED ITEM, [facility]:  Dexamethasone 0.4% liquid solution 16oz to be used for Iontophoresis during PT., Disp: 16 each, Rfl: 0 .  rizatriptan (MAXALT) 10 MG tablet, Take 1 tablet (10 mg total) by mouth as needed for migraine. May repeat in 2 hours if needed, Disp: 12 tablet, Rfl: 11 .  spironolactone (ALDACTONE) 25 MG tablet, Take 25 mg by mouth daily., Disp: , Rfl:   Social History   Tobacco Use  Smoking Status Never Smoker  Smokeless Tobacco Never Used    Allergies  Allergen Reactions  . Tylox  [Oxycodone-Acetaminophen] Itching  . Codeine     nausea   Objective:  There were no vitals filed for this visit. There is no height or weight on file to calculate BMI. Constitutional Well developed. Well nourished.  Vascular Dorsalis pedis pulses palpable bilaterally. Posterior tibial pulses palpable bilaterally. Capillary refill normal to all digits.  No cyanosis or clubbing noted. Pedal hair growth normal.  Neurologic Normal speech. Oriented to person, place, and time. Epicritic sensation to light touch grossly present bilaterally.  Dermatologic Nails well groomed and normal in appearance. No open wounds. No skin lesions.  Orthopedic: Normal joint ROM without pain or crepitus bilaterally. No visible deformities. POP dorsal 1st [place] joint right.  No pain along tibialis anterior tendon.  No pain about the medial navicular Pain palpation about the medial calcaneal tuber bilaterally   Radiographs: Taken and reviewed.  Area of painful marker corresponds to the first navicular-cuneiform joint. Assessment:   1. Capsulitis   2. Plantar fasciitis    Plan:  Patient was evaluated and treated and all questions answered.  Plantar fasciitis bilaterally -Recurrence noted -Injection delivered to both heels -Continue aggressive stretching and icing  Procedure: Injection Tendon/Ligament Consent: Verbal consent obtained. Location: Bilateral plantar fascia at the glabrous junction; medial approach. Skin Prep: Alcohol. Injectate: 1 cc 0.5% marcaine plain, 1 cc dexamethasone phosphate, 0.5 cc kenalog 10. Disposition: Patient tolerated  procedure well. Injection site dressed with a band-aid.  First naviculocuneiform joint arthritis -Injection delivered as below -Discussed if symptomatic would consider possible MRI  Procedure: Joint Injection Location: Right 1st [place] joint Skin Prep: Alcohol. Injectate: 0.5 cc 1% lidocaine plain, 0.5 cc dexamethasone phosphate. Disposition: Patient  tolerated procedure well. Injection site dressed with a band-aid.   Return in about 3 weeks (around 04/21/2018) for Plantar fasciitis, Bilateral, Capsulitis, Left.

## 2018-04-18 DIAGNOSIS — Z01419 Encounter for gynecological examination (general) (routine) without abnormal findings: Secondary | ICD-10-CM | POA: Diagnosis not present

## 2018-04-18 DIAGNOSIS — Z1231 Encounter for screening mammogram for malignant neoplasm of breast: Secondary | ICD-10-CM | POA: Diagnosis not present

## 2018-04-18 DIAGNOSIS — Z6826 Body mass index (BMI) 26.0-26.9, adult: Secondary | ICD-10-CM | POA: Diagnosis not present

## 2018-04-19 ENCOUNTER — Other Ambulatory Visit: Payer: Self-pay | Admitting: Neurology

## 2018-04-21 ENCOUNTER — Ambulatory Visit: Payer: BLUE CROSS/BLUE SHIELD | Admitting: Podiatry

## 2018-04-21 ENCOUNTER — Encounter: Payer: Self-pay | Admitting: Podiatry

## 2018-04-21 DIAGNOSIS — M659 Unspecified synovitis and tenosynovitis, unspecified site: Secondary | ICD-10-CM

## 2018-04-21 DIAGNOSIS — M722 Plantar fascial fibromatosis: Secondary | ICD-10-CM

## 2018-04-21 DIAGNOSIS — M779 Enthesopathy, unspecified: Secondary | ICD-10-CM | POA: Diagnosis not present

## 2018-04-21 DIAGNOSIS — M778 Other enthesopathies, not elsewhere classified: Secondary | ICD-10-CM

## 2018-04-21 NOTE — Progress Notes (Signed)
Subjective:  Patient ID: [name], female    DOB: Jan 24, 1966,  MRN: [id number]  Chief Complaint  Patient presents with  . Plantar Fasciitis    3 Week F/U PF BILATERAL FOOT PAIN,"doing ok", right is still about the same as it was last office visit    52 y.o. female presents with the above complaint.  Persistent right foot pain. Injection didn't help. Feels worst after activity. Has most pain with extending or flexing her foot.    Review of Systems: Negative except as noted in the HPI. Denies N/V/F/Ch.  Past Medical History:  Diagnosis Date  . Anemia   . Anxiety   . Brachial neuritis or radiculitis NOS   . Endometriosis   . H/O: hysterectomy   . IBS (irritable bowel syndrome)   . Migraine, unspecified, without mention of intractable migraine without mention of status migrainosus   . Migraines   . Neuropathy   . Shoulder pain     Current Outpatient Medications:  .  diclofenac (VOLTAREN) 50 MG EC tablet, Take 1 tablet (50 mg total) by mouth as needed., Disp: 20 tablet, Rfl: 1 .  LINZESS 145 MCG CAPS capsule, TAKE 1 CAPSULE BY MOUTH DAILY BEFORE BREAKFAST, Disp: , Rfl: 6 .  meloxicam (MOBIC) 15 MG tablet, TAKE 1 TABLET BY MOUTH EVERY DAY, Disp: 30 tablet, Rfl: 0 .  methylPREDNISolone (MEDROL DOSEPAK) 4 MG TBPK tablet, follow package directions, Disp: 21 tablet, Rfl: 1 .  NONFORMULARY OR COMPOUNDED ITEM, [facility]:  Dexamethasone 0.4% liquid solution 16oz to be used for Iontophoresis during PT., Disp: 16 each, Rfl: 0 .  PREMARIN vaginal cream, , Disp: , Rfl: 5 .  rizatriptan (MAXALT) 10 MG tablet, Take 1 tab at onset of migraine.  May repeat in 2 hrs, if needed.  Max dose: 2 tabs/day. This is a 30 day prescription., Disp: 12 tablet, Rfl: 5 .  spironolactone (ALDACTONE) 25 MG tablet, Take 25 mg by mouth daily., Disp: , Rfl:  .  tretinoin (RETIN-A) 0.025 % cream, APPLY TO AFFECTED AREA ON FACE AT BEDTIME ONCE A DAY, Disp: , Rfl: 3  Social History   Tobacco Use    Smoking Status Never Smoker  Smokeless Tobacco Never Used    Allergies  Allergen Reactions  . Tylox [Oxycodone-Acetaminophen] Itching  . Codeine     nausea   Objective:  There were no vitals filed for this visit. There is no height or weight on file to calculate BMI. Constitutional Well developed. Well nourished.  Vascular Dorsalis pedis pulses palpable bilaterally. Posterior tibial pulses palpable bilaterally. Capillary refill normal to all digits.  No cyanosis or clubbing noted. Pedal hair growth normal.  Neurologic Normal speech. Oriented to person, place, and time. Epicritic sensation to light touch grossly present bilaterally.  Dermatologic Nails well groomed and normal in appearance. No open wounds. No skin lesions.  Orthopedic: Normal joint ROM without pain or crepitus bilaterally. No visible deformities. POP dorsal 1st [place] joint right. Pain along the extensor hallucis tendon. Pain palpation about the medial calcaneal tuber bilaterally   Radiographs: Taken and reviewed.  Area of painful marker corresponds to the first navicular-cuneiform joint. Assessment:   1. Plantar fasciitis   2. Synovitis and tenosynovitis   3. Extensor tendonitis of foot    Plan:  Patient was evaluated and treated and all questions answered.   Plantar Fasciitis bilat, Right first naviculocuneiform joint arthritis vs extensor tendonitis -Injection therapy falied to provide relief. -Will order MRI to r/o tendon tear.  Has failed conservative therapy including PT, stretching, NSAIDs, injections. -Will refer MRI. Discussed based upon findings she may benefit from exostectomy vs joint fusion or repair of torn tendon if present.  Return in about 4 weeks (around 05/19/2018) for MRI follow up .

## 2018-04-22 ENCOUNTER — Telehealth: Payer: Self-pay | Admitting: *Deleted

## 2018-04-22 DIAGNOSIS — M659 Synovitis and tenosynovitis, unspecified: Secondary | ICD-10-CM

## 2018-04-22 DIAGNOSIS — M722 Plantar fascial fibromatosis: Secondary | ICD-10-CM

## 2018-04-22 DIAGNOSIS — M779 Enthesopathy, unspecified: Secondary | ICD-10-CM

## 2018-04-22 DIAGNOSIS — M778 Other enthesopathies, not elsewhere classified: Secondary | ICD-10-CM

## 2018-04-22 NOTE — Telephone Encounter (Signed)
Orders to J. Quintana, RN for pre-cert, faxed to [facility] Scheduling. 

## 2018-04-22 NOTE — Telephone Encounter (Signed)
-----   Message from Evelina Bucy, DPM sent at 04/21/2018  2:05 PM EDT ----- Can we order MRI L ankle. Extend as far proximally into the foot as possible to include the extensor hallucis longus tendon course.

## 2018-04-28 ENCOUNTER — Telehealth: Payer: Self-pay | Admitting: Neurology

## 2018-04-28 ENCOUNTER — Other Ambulatory Visit: Payer: Self-pay | Admitting: Neurology

## 2018-04-28 ENCOUNTER — Other Ambulatory Visit: Payer: Self-pay | Admitting: *Deleted

## 2018-04-28 MED ORDER — METHYLPREDNISOLONE 4 MG PO TBPK: ORAL_TABLET | ORAL | 0 refills | Status: DC

## 2018-04-28 NOTE — Telephone Encounter (Signed)
Spoke with patient. She stated that her migraine was bad for two days, unable to open her eyes. Today is a little better. Informed pt that unfortunately we have no openings today. Discussed medrol dose pack. Pt stated this has worked in the past. Pt advised that if migraine worsens or develops new symptoms proceed to ED. Pt verbalized understanding and appreciation. Pt encouraged to call back if steroid doesn't help.  Plan discussed with Dr. Jaynee Eagles. [facility] [address] per v.o. Dr. Jaynee Eagles.

## 2018-04-28 NOTE — Telephone Encounter (Signed)
Pt called stating she has had a migraine medication for 3 days, stating medication have no helped. Requesting a call to discuss coming in today if possible

## 2018-05-02 ENCOUNTER — Ambulatory Visit (HOSPITAL_COMMUNITY): Payer: BLUE CROSS/BLUE SHIELD

## 2018-05-02 ENCOUNTER — Encounter (HOSPITAL_COMMUNITY): Payer: Self-pay

## 2018-05-11 ENCOUNTER — Ambulatory Visit
Admission: RE | Admit: 2018-05-11 | Discharge: 2018-05-11 | Disposition: A | Discharge status: Home / Self Care | Payer: BLUE CROSS/BLUE SHIELD | Source: Ambulatory Visit | Attending: Internal Medicine | Admitting: Internal Medicine

## 2018-05-11 ENCOUNTER — Other Ambulatory Visit: Payer: Self-pay | Admitting: Internal Medicine

## 2018-05-11 DIAGNOSIS — R319 Hematuria, unspecified: Secondary | ICD-10-CM

## 2018-05-11 DIAGNOSIS — R109 Unspecified abdominal pain: Secondary | ICD-10-CM

## 2018-05-19 ENCOUNTER — Ambulatory Visit: Payer: BLUE CROSS/BLUE SHIELD | Admitting: Podiatry

## 2018-05-30 ENCOUNTER — Telehealth: Payer: Self-pay | Admitting: Gastroenterology

## 2018-05-30 NOTE — Telephone Encounter (Signed)
Pt called in with questions about med that is no longer being covered by ins

## 2018-05-31 NOTE — Telephone Encounter (Signed)
Patient states that her insurance as of Jan 2020 will no longer cover Linzess. She also want to know about using Xanax for abdominal spasms as she has used it in the past, given by Dr Olevia Perches. Follow up to discuss abd spasms has been made for 06-16-2018 @ 830am

## 2018-06-06 ENCOUNTER — Other Ambulatory Visit: Payer: Self-pay | Admitting: Neurology

## 2018-06-16 ENCOUNTER — Ambulatory Visit: Payer: BLUE CROSS/BLUE SHIELD | Admitting: Gastroenterology

## 2018-06-16 ENCOUNTER — Encounter: Payer: Self-pay | Admitting: Gastroenterology

## 2018-06-16 VITALS — BP 94/60 | HR 93 | Ht 68.0 in | Wt 170.0 lb

## 2018-06-16 DIAGNOSIS — K5904 Chronic idiopathic constipation: Secondary | ICD-10-CM

## 2018-06-16 DIAGNOSIS — R140 Abdominal distension (gaseous): Secondary | ICD-10-CM

## 2018-06-16 NOTE — Patient Instructions (Signed)
If you are age 52 or older, your body mass index should be between 23-30. Your Body mass index is 25.85 kg/m. If this is out of the aforementioned range listed, please consider follow up with your Primary Care Provider.  If you are age 72 or younger, your body mass index should be between 19-25. Your Body mass index is 25.85 kg/m. If this is out of the aformentioned range listed, please consider follow up with your Primary Care Provider.   You have been given samples of Motegrity.  Please call Dr. Loletha Carrow in 2 weeks with an update.   Thank you

## 2018-06-16 NOTE — Progress Notes (Signed)
[facility] Progress Note  Chief Complaint: Chronic constipation, bloating  Subjective  History:  [name] was here in February seeing me for another opinion on chronic constipation and bloating after previously seeing Drs. Nandigam and Brodie.  [name] was taking Linzess with modest results as far as constipation, [name] still had persistent bloating and distention causing concern and frustration.  [name] last colonoscopy was in April 2014.  [name] is feeling much the same as before, with chronic constipation that is marginally improved when [name] takes Linzess at night perhaps twice a week.  Typically takes 2 of the 145 mcg tablets.  [name] has a lot of bloating and gas that is often quite painful.  [name] noticed an increase in intestinal cramps when [name] was stressed with some family recently visiting.  [name] inquired about Xanax, which [name] recalls Dr. Olevia Perches had prescribed for [name] in the past.  [name] also previously was prescribed hyoscyamine.  [name] also tells me that [name] insurance notified [name] Linzess would not be covered in the near future without a prior authorization.  ROS: Cardiovascular:  no chest pain Respiratory: no dyspnea Remainder of systems negative except as above  The patient's Past Medical, Family and Social History were reviewed and are on file in the EMR.  Objective:  Med list reviewed  Current Outpatient Medications:  .  diclofenac (VOLTAREN) 50 MG EC tablet, TAKE 1 TABLET (50 MG TOTAL) BY MOUTH AS NEEDED., Disp: 20 tablet, Rfl: 1 .  LINZESS 145 MCG CAPS capsule, TAKE 1 CAPSULE BY MOUTH DAILY BEFORE BREAKFAST, Disp: , Rfl: 6 .  rizatriptan (MAXALT) 10 MG tablet, Take 1 tab at onset of migraine.  May repeat in 2 hrs, if needed.  Max dose: 2 tabs/day. This is a 30 day prescription., Disp: 12 tablet, Rfl: 5 .  spironolactone (ALDACTONE) 25 MG tablet, Take 25 mg by mouth daily., Disp: , Rfl:  .  tretinoin (RETIN-A) 0.025 % cream, APPLY TO AFFECTED AREA ON FACE AT BEDTIME ONCE A DAY,  Disp: , Rfl: 3   Vital signs in last 24 hrs: Vitals:   06/16/18 0844  BP: 94/60  Pulse: 93    Physical Exam  [name] is well-appearing  HEENT: sclera anicteric, oral mucosa moist without lesions  Neck: supple, no thyromegaly, JVD or lymphadenopathy  Cardiac: RRR without murmurs, S1S2 heard, no peripheral edema  Pulm: clear to auscultation bilaterally, normal RR and effort noted  Abdomen: soft, no tenderness, with active bowel sounds. No guarding or palpable hepatosplenomegaly.  Distended  Skin; warm and dry, no jaundice or rash   @ASSESSMENTPLANBEGIN @ Assessment: Encounter Diagnoses  Name Primary?  . Chronic idiopathic constipation Yes  . Abdominal bloating    [name] has chronic idiopathic constipation/IBS with prominent bloating and excessive gas. Without Linzess rarely has a bowel movement.  While the results from that are not ideal, it has certainly improved frequency of bowel movements.  [name] needs to take it in the evening because it would usually cause loose stool with urgency several hours later.  I offered a trial of prucalopride, which works by different mechanism.  [name] does not have a history of cardiovascular or cerebrovascular disease which are contraindications to this medicine.  I do not know how it will compared to the [place], but think it is worth a try.  If it is more effective for the constipation (and perhaps with some improvement in the bloating and gas), then [name] may prefer to try that over [place].  Will hold on  any prior authorization of Linzess to see which of these medicines is working better.  Depending upon [name] response to prucalopride, I also discussed a possible trial of empiric therapy for small bowel bacterial overgrowth since gas and bloating [name] predominant symptom for [name].  Rifaximin is usually prohibitively expensive, and often cannot get approval without confirmatory testing for bacterial overgrowth, which is not widely available.  Therefore, I  would probably use combination of Keflex and metronidazole.  I directed [name] to primary care to address anxiety and any appropriate medical treatment for that.  I think hyoscyamine would likely worsen [name] constipation in this case.  Of asked [name] to contact us in a couple of weeks with an update.    Total time 25 minutes, over half spent face-to-face with patient in counseling and coordination of care.   Nelida Meuse III

## 2018-08-01 ENCOUNTER — Telehealth: Payer: Self-pay | Admitting: Gastroenterology

## 2018-08-01 NOTE — Telephone Encounter (Signed)
Pt called to inform that her insurance needs a letter from [place] in order to cover linzess.

## 2018-08-05 NOTE — Telephone Encounter (Signed)
Left a message to return call.  

## 2018-08-05 NOTE — Telephone Encounter (Signed)
PT is following up advised that she has called several times and no one has got back to her on the letter of authorization.

## 2018-08-09 MED ORDER — LINZESS 145 MCG PO CAPS: ORAL_CAPSULE | ORAL | 6 refills | Status: DC

## 2018-08-09 NOTE — Telephone Encounter (Signed)
Approved Linzess 145 mcg from 08-09-2018 to 08-08-2019 reference number [id number]

## 2018-08-09 NOTE — Telephone Encounter (Signed)
PA request sent to Covermymeds.

## 2018-08-09 NOTE — Telephone Encounter (Signed)
Called and spoke to the pharmacy on 08-08-2020. They will fax the prior auth request.

## 2018-09-01 ENCOUNTER — Other Ambulatory Visit: Payer: Self-pay | Admitting: Neurology

## 2018-09-01 ENCOUNTER — Other Ambulatory Visit: Payer: Self-pay | Admitting: Gastroenterology

## 2018-09-07 DIAGNOSIS — Z79899 Other long term (current) drug therapy: Secondary | ICD-10-CM | POA: Diagnosis not present

## 2018-09-07 DIAGNOSIS — L700 Acne vulgaris: Secondary | ICD-10-CM | POA: Diagnosis not present

## 2018-10-12 ENCOUNTER — Other Ambulatory Visit: Payer: Self-pay | Admitting: *Deleted

## 2018-10-12 ENCOUNTER — Telehealth: Payer: Self-pay | Admitting: Neurology

## 2018-10-12 MED ORDER — METHYLPREDNISOLONE 4 MG PO TBPK: ORAL_TABLET | ORAL | 1 refills | Status: DC

## 2018-10-12 NOTE — Telephone Encounter (Signed)
Medrol dose pack refilled with 1 extra refill per v.o. Dr. Jaynee Eagles.

## 2018-10-12 NOTE — Telephone Encounter (Signed)
Called pt & LVM (ok per DPR) advising Dr. Jaynee Eagles gave order for Medrol dose pack refill with extra refill in case she needs it. same as one she took in October. [facility] to [facility] location requested. Advised pt that instructions will tell her how many pills to take each day. She may take the daily pills together in the morning with food over 6 day period. Advised pt to please call if she has any questions. Also advised if she has any new symptoms call office/ any signs of stroke call 911. Left office number in message for call back.

## 2018-10-12 NOTE — Telephone Encounter (Signed)
Pt is asking for a prescription of Prednisone for her migraine she has had for 5 days [id number] .

## 2018-10-25 ENCOUNTER — Other Ambulatory Visit: Payer: Self-pay | Admitting: Neurology

## 2018-11-02 DIAGNOSIS — H10412 Chronic giant papillary conjunctivitis, left eye: Secondary | ICD-10-CM | POA: Diagnosis not present

## 2018-11-16 DIAGNOSIS — H10412 Chronic giant papillary conjunctivitis, left eye: Secondary | ICD-10-CM | POA: Diagnosis not present

## 2018-12-28 ENCOUNTER — Other Ambulatory Visit: Payer: Self-pay | Admitting: Neurology

## 2019-01-07 ENCOUNTER — Other Ambulatory Visit: Payer: Self-pay | Admitting: Neurology

## 2019-01-10 DIAGNOSIS — S0992XA Unspecified injury of nose, initial encounter: Secondary | ICD-10-CM | POA: Diagnosis not present

## 2019-02-09 ENCOUNTER — Other Ambulatory Visit: Payer: Self-pay | Admitting: Neurology

## 2019-02-09 NOTE — Telephone Encounter (Signed)
Pending appt with Dr. Jaynee Eagles on 02/21/2019. Refill authorized.

## 2019-02-21 ENCOUNTER — Encounter

## 2019-02-21 ENCOUNTER — Ambulatory Visit: Payer: BC Managed Care – PPO | Admitting: Neurology

## 2019-02-21 ENCOUNTER — Encounter: Payer: Self-pay | Admitting: Neurology

## 2019-02-21 ENCOUNTER — Telehealth: Payer: Self-pay | Admitting: Neurology

## 2019-02-21 ENCOUNTER — Other Ambulatory Visit: Payer: Self-pay

## 2019-02-21 VITALS — BP 120/76 | HR 76 | Temp 96.9°F | Ht 68.0 in | Wt 172.0 lb

## 2019-02-21 DIAGNOSIS — G43709 Chronic migraine without aura, not intractable, without status migrainosus: Secondary | ICD-10-CM

## 2019-02-21 MED ORDER — RIZATRIPTAN BENZOATE 10 MG PO TABS: ORAL_TABLET | ORAL | 11 refills | Status: DC

## 2019-02-21 MED ORDER — NURTEC 75 MG PO TBDP: 75.0000 mg | ORAL_TABLET | Freq: Every day | ORAL | 6 refills | Status: DC | PRN

## 2019-02-21 MED ORDER — TOPIRAMATE 50 MG PO TABS: ORAL_TABLET | ORAL | 6 refills | Status: DC

## 2019-02-21 MED ORDER — ALPRAZOLAM 0.25 MG PO TABS: 0.2500 mg | ORAL_TABLET | Freq: Three times a day (TID) | ORAL | 1 refills | Status: DC | PRN

## 2019-02-21 MED ORDER — AJOVY 225 MG/1.5ML ~~LOC~~ SOAJ: 225.0000 mg | SUBCUTANEOUS | 11 refills | Status: DC

## 2019-02-21 MED ORDER — ALPRAZOLAM 0.25 MG PO TABS: 0.2500 mg | ORAL_TABLET | Freq: Three times a day (TID) | ORAL | 0 refills | Status: DC | PRN

## 2019-02-21 MED ORDER — DICLOFENAC SODIUM 50 MG PO TBEC: 50.0000 mg | DELAYED_RELEASE_TABLET | Freq: Four times a day (QID) | ORAL | 11 refills | Status: DC | PRN

## 2019-02-21 MED ORDER — METHYLPREDNISOLONE 4 MG PO TBPK: ORAL_TABLET | ORAL | 1 refills | Status: DC

## 2019-02-21 NOTE — Progress Notes (Addendum)
[facility]    Provider:  Dr [name] Referring Provider: Osborne Casco Fransico Him, MD Primary Care Physician:  Meghan Pao, MD  CC:  Migraine  Interval history 02/21/2019: Here for follow up of migraines. She tried the [place] but she had constipation and could not tolerate. She saw [name] in the past and has been on multiple meds in the past. Worsening migraines. She is having most of the month if headaches and 10 days a month of migraines. She only took the [place]. Recommended Ajovy. Discussed oral medication.   HPI 11/11/2018:  [name] is a 53 y.o. female here as a referral from Dr. Osborne Casco for migraine. PMHX migraines, irritable bowel symptom, endometriosis, anxiety, anemia.  Patient is transitioning from 1 of my colleagues to me for migraine management.  Patient was previously seen by [name].  Patient has a history of migraines for over 20 years.  She has an extensive family history of migraines.  She has been on multiple medications including Topamax up to 200 mg a day.  Her headaches are normally located in the right side of the head, throbbing and pulsating, she has osmophobia and smells can trigger, she endorses nausea, so pounding that she can't move, can be severe, also light sensitivity, no aura. 20 headache days a month, more than 10 are migrainous, can be severe and can last > 24 hours without treatment. No medication overuse.   Medications tried include: Gabapentin, topiramate up to 200 mg daily, steroids, Cymbalta, diclofenac, Zofran, Mobic, blood pressure medications Spironolactone, tizanidine  Reviewed notes, labs and imaging from outside physicians, which showed:  Reviewed EMG study, data and results which was normal no electrodiagnostic evidence of large fiber peripheral neuropathy or right lumbosacral radiculopathy.  MRI of the brain images which I reviewed was normal November 2007.  Reviewed prior neurology notes: Headaches began in childhood  since the age of 67.  Neuro is in the left side, and related to her menstrual cycle, and not associated with disturbances of nausea or vomiting or visual but does have a history of endometriosis.  Headaches last intermittently for 5 days and are improved with ketoprofen, Maxalt therapy and coffee.  Her daughter has been to the headache clinic and uses Biofreeze to the scalp as well as Maxalt.  Extensive family history of the patient's daughter, son, grandmother and mother have migraines.  Her migraines are throbbing, she has smell sensitivity and triggers and an ice pick quality.  She has been on multiple packs of steroids.  She has chronic neck pain.  IV Depacon have worked acutely for her migraines in the past  Review of Systems: Patient complains of symptoms per HPI as well as the following symptoms: headache. Pertinent negatives and positives per HPI. All others negative.   Social History   Socioeconomic History  . Marital status: Married    Spouse name: [name]  . Number of children: 2  . Years of education: Not on file  . Highest education level: Associate degree: academic program  Occupational History  . Occupation: stay at home  Social Needs  . Financial resource strain: Not on file  . Food insecurity    Worry: Not on file    Inability: Not on file  . Transportation needs    Medical: Not on file    Non-medical: Not on file  Tobacco Use  . Smoking status: Former Research scientist (life sciences)  . Smokeless tobacco: Never Used  Substance and Sexual Activity  . Alcohol use: No  Alcohol/week: 0.0 standard drinks  . Drug use: No  . Sexual activity: Yes    Birth control/protection: Surgical  Lifestyle  . Physical activity    Days per week: Not on file    Minutes per session: Not on file  . Stress: Not on file  Relationships  . Social Herbalist on phone: Not on file    Gets together: Not on file    Attends religious service: Not on file    Active member of club or organization: Not on  file    Attends meetings of clubs or organizations: Not on file    Relationship status: Not on file  . Intimate partner violence    Fear of current or ex partner: Not on file    Emotionally abused: Not on file    Physically abused: Not on file    Forced sexual activity: Not on file  Other Topics Concern  . Not on file  Social History Narrative   Patient lives at home with her husband [name]. Drinks two cups of caffeine daily. Patient has two years of college education. Right handed.    Family History  Problem Relation Age of Onset  . Diabetes Mother        both sides grandparents  . Hypertension Mother   . Colon polyps Mother   . Thyroid disease Father   . Colon polyps Father   . Colon cancer Neg Hx   . Stomach cancer Neg Hx   . Esophageal cancer Neg Hx     Past Medical History:  Diagnosis Date  . Anemia   . Anxiety   . Brachial neuritis or radiculitis NOS   . Endometriosis   . H/O: hysterectomy   . IBS (irritable bowel syndrome)   . Migraine, unspecified, without mention of intractable migraine without mention of status migrainosus   . Migraines   . Neuropathy   . Shoulder pain     Past Surgical History:  Procedure Laterality Date  . ABDOMINAL HYSTERECTOMY    . CHOLECYSTECTOMY    . CYSTOSCOPY  11/17/2011   Procedure: CYSTOSCOPY;  Surgeon: Darlyn Chamber, MD;  Location: [facility];  Service: Gynecology;;  . DIAGNOSTIC LAPAROSCOPY    . LAPAROSCOPY  11/17/2011   Procedure: LAPAROSCOPY OPERATIVE;  Surgeon: Darlyn Chamber, MD;  Location: [facility];  Service: Gynecology;  Laterality: N/A;  with Injection of Vaginal Cuff with Dexamethasone mixed in 0.5% Bupivacaine  . NASAL SEPTUM SURGERY     bone spur    Current Outpatient Medications  Medication Sig Dispense Refill  . diclofenac (VOLTAREN) 50 MG EC tablet Take 1 tablet (50 mg total) by mouth 4 (four) times daily as needed. 20 tablet 11  . LINZESS 145 MCG CAPS capsule TAKE 1 CAPSULE BY MOUTH DAILY BEFORE BREAKFAST (Patient  taking differently: as needed. TAKE 1 CAPSULE BY MOUTH DAILY BEFORE BREAKFAST) 30 capsule 6  . OVER THE COUNTER MEDICATION Takes claritin or Zyrtec    . rizatriptan (MAXALT) 10 MG tablet May repeat in 2 hours if needed 12 tablet 11  . spironolactone (ALDACTONE) 25 MG tablet Take 25 mg by mouth daily.    Marland Kitchen tretinoin (RETIN-A) 0.025 % cream as needed.   3  . ALPRAZolam (XANAX) 0.25 MG tablet Take 1 tablet (0.25 mg total) by mouth 3 (three) times daily as needed. 60 tablet 1  . Fremanezumab-vfrm (AJOVY) 225 MG/1.5ML SOAJ Inject 225 mg into the skin every 30 (thirty) days. 1 pen 11  .  methylPREDNISolone (MEDROL DOSEPAK) 4 MG TBPK tablet May take daily pills each morning with food. Take for 6 days. 21 tablet 1  . Rimegepant Sulfate (NURTEC) 75 MG TBDP Take 75 mg by mouth daily as needed. For migraines. Take as close to onset of migraine as possible. One daily maximum. 10 tablet 6  . topiramate (TOPAMAX) 50 MG tablet Start with 50mg  at bedtime for a week, then 100mg  at bedtime 60 tablet 6   No current facility-administered medications for this visit.     Allergies as of 02/21/2019 - Review Complete 02/21/2019  Allergen Reaction Noted  . Tylox [oxycodone-acetaminophen] Itching 11/20/2011  . Codeine  08/05/2011    Vitals: BP 120/76 (BP Location: Right Arm, Patient Position: Sitting)   Pulse 76   Temp (!) 96.9 F (36.1 C) Comment: taken by check-in staff  Ht 5\' 8"  (1.727 m)   Wt 172 lb (78 kg)   BMI 26.15 kg/m  Last Weight:  Wt Readings from Last 1 Encounters:  02/21/19 172 lb (78 kg)   Last Height:   Ht Readings from Last 1 Encounters:  02/21/19 5\' 8"  (1.727 m)   Physical exam: Exam: Gen: NAD, conversant, well nourised, obese, well groomed                     CV: RRR, no MRG. No Carotid Bruits. No peripheral edema, warm, nontender Eyes: Conjunctivae clear without exudates or hemorrhage  Neuro: Detailed Neurologic Exam  Speech:    Speech is normal; fluent and spontaneous with  normal comprehension.  Cognition:    The patient is oriented to person, place, and time;     recent and remote memory intact;     language fluent;     normal attention, concentration,     fund of knowledge Cranial Nerves:    The pupils are equal, round, and reactive to light. The fundi are normal and spontaneous venous pulsations are present. Visual fields are full to finger confrontation. Extraocular movements are intact. Trigeminal sensation is intact and the muscles of mastication are normal. The face is symmetric. The palate elevates in the midline. Hearing intact. Voice is normal. Shoulder shrug is normal. The tongue has normal motion without fasciculations.   Coordination:    Normal finger to nose and heel to shin. Normal rapid alternating movements.   Gait:    Heel-toe and tandem gait are normal.   Motor Observation:    No asymmetry, no atrophy, and no involuntary movements noted. Tone:    Normal muscle tone.    Posture:    Posture is normal. normal erect    Strength:    Strength is V/V in the upper and lower limbs.      Sensation: intact to LT     Reflex Exam:  DTR's:    Deep tendon reflexes in the upper and lower extremities are normal bilaterally.   Toes:    The toes are downgoing bilaterally.   Clonus:    Clonus is absent.     Assessment/Plan:  53 year old female with chronic intractable migraines. Failed multiple classes of medications still with considerable burden of migraines.  Recommend Botox for migraine but she would like to try Ajovy, will restart Topamax, Nurtec for acute management One prescription if xanax, checked the drug registry, she can get refills from pcp Refill maxalt and diclofenac. Prednisone to keep on hand for severe migraine intractable to acute meds.   Meds ordered this encounter  Medications  . topiramate (  TOPAMAX) 50 MG tablet    Sig: Start with 50mg  at bedtime for a week, then 100mg  at bedtime    Dispense:  60 tablet    Refill:   6  . DISCONTD: ALPRAZolam (XANAX) 0.25 MG tablet    Sig: Take 1 tablet (0.25 mg total) by mouth 3 (three) times daily as needed.    Dispense:  60 tablet    Refill:  0  . Fremanezumab-vfrm (AJOVY) 225 MG/1.5ML SOAJ    Sig: Inject 225 mg into the skin every 30 (thirty) days.    Dispense:  1 pen    Refill:  11    Patient has copay card; she can have medication for $5 regardless of insurance approval or copay amount.  . Rimegepant Sulfate (NURTEC) 75 MG TBDP    Sig: Take 75 mg by mouth daily as needed. For migraines. Take as close to onset of migraine as possible. One daily maximum.    Dispense:  10 tablet    Refill:  6    Patient has copay card; she can have medication for $5 regardless of insurance approval or copay amount.  . ALPRAZolam (XANAX) 0.25 MG tablet    Sig: Take 1 tablet (0.25 mg total) by mouth 3 (three) times daily as needed.    Dispense:  60 tablet    Refill:  1  . rizatriptan (MAXALT) 10 MG tablet    Sig: May repeat in 2 hours if needed    Dispense:  12 tablet    Refill:  11    May only fill once per month.  . diclofenac (VOLTAREN) 50 MG EC tablet    Sig: Take 1 tablet (50 mg total) by mouth 4 (four) times daily as needed.    Dispense:  20 tablet    Refill:  11  . methylPREDNISolone (MEDROL DOSEPAK) 4 MG TBPK tablet    Sig: May take daily pills each morning with food. Take for 6 days.    Dispense:  21 tablet    Refill:  1     Discussed: To prevent or relieve headaches, try the following: Cool Compress. Lie down and place a cool compress on your head.  Avoid headache triggers. If certain foods or odors seem to have triggered your migraines in the past, avoid them. A headache diary might help you identify triggers.  Include physical activity in your daily routine. Try a daily walk or other moderate aerobic exercise.  Manage stress. Find healthy ways to cope with the stressors, such as delegating tasks on your to-do list.  Practice relaxation techniques. Try deep  breathing, yoga, massage and visualization.  Eat regularly. Eating regularly scheduled meals and maintaining a healthy diet might help prevent headaches. Also, drink plenty of fluids.  Follow a regular sleep schedule. Sleep deprivation might contribute to headaches Consider biofeedback. With this mind-body technique, you learn to control certain bodily functions - such as muscle tension, heart rate and blood pressure - to prevent headaches or reduce headache pain.    Proceed to emergency room if you experience new or worsening symptoms or symptoms do not resolve, if you have new neurologic symptoms or if headache is severe, or for any concerning symptom.   Provided education and documentation from American headache Society toolbox including articles on: chronic migraine medication overuse headache, chronic migraines, prevention of migraines, behavioral and other nonpharmacologic treatments for headache.  A total of 30  minutes was spent face-to-face with this patient. Over half this time was spent on  counseling patient on the  1. Chronic migraine without aura without status migrainosus, not intractable    diagnosis and different diagnostic and therapeutic options, counseling and coordination of care, risks ans benefits of management, compliance, or risk factor reduction and education.      Sarina Ill, MD  [facility] [address] [place], [place] [address]  Phone [phone] Fax [phone]  A total of 50 minutes was spent face-to-face with this patient. Over half this time was spent on counseling patient on the chronic migraine diagnosis and different diagnostic and therapeutic options, counseling and coordination of care, risks ans benefits of management, compliance, risk factor reduction and education.

## 2019-02-21 NOTE — Telephone Encounter (Signed)
Patient states she will call back to schedule 1 yr f/u w/ Dr. Jaynee Eagles

## 2019-02-21 NOTE — Addendum Note (Signed)
Addended by: Sarina Ill B on: 02/21/2019 09:22 AM   Modules accepted: Orders

## 2019-02-21 NOTE — Addendum Note (Signed)
Addended by: Sarina Ill B on: 02/21/2019 09:15 AM   Modules accepted: Orders

## 2019-02-21 NOTE — Patient Instructions (Addendum)
Nurtec: Please take one tablet at the onset of your headache.Once daily. Start Ajovy Start Topiramate   [employer]: Patient drug information [employer] here. Copyright [phone] [employer] All rights reserved. (For additional information see "[employer]: Drug information") Brand Names: [place]  Nurtec  What is this drug used for?   It is used to treat migraine headaches.  What do I need to tell my doctor BEFORE I take this drug?   If you are allergic to this drug; any part of this drug; or any other drugs, foods, or substances. Tell your doctor about the allergy and what signs you had.   If you have any of these health problems: Kidney disease or liver disease.   If you take any drugs (prescription or OTC, natural products, vitamins) that must not be taken with this drug, like certain drugs that are used for HIV, infections, or seizures. There are many drugs that must not be taken with this drug.   This is not a list of all drugs or health problems that interact with this drug.   Tell your doctor and pharmacist about all of your drugs (prescription or OTC, natural products, vitamins) and health problems. You must check to make sure that it is safe for you to take this drug with all of your drugs and health problems. Do not start, stop, or change the dose of any drug without checking with your doctor.  What are some things I need to know or do while I take this drug?   Tell all of your health care providers that you take this drug. This includes your doctors, nurses, pharmacists, and dentists.   This drug is not meant to prevent or lower the number of migraine headaches you get.   Tell your doctor if you are pregnant, plan on getting pregnant, or are breast-feeding. You will need to talk about the benefits and risks to you and the baby.  What are some side effects that I need to call my doctor about right away?   WARNING/CAUTION: Even though it may be rare, some people may have  very bad and sometimes deadly side effects when taking a drug. Tell your doctor or get medical help right away if you have any of the following signs or symptoms that may be related to a very bad side effect:   Signs of an allergic reaction, like rash; hives; itching; red, swollen, blistered, or peeling skin with or without fever; wheezing; tightness in the chest or throat; trouble breathing, swallowing, or talking; unusual hoarseness; or swelling of the mouth, face, lips, tongue, or throat.  What are some other side effects of this drug?   All drugs may cause side effects. However, many people have no side effects or only have minor side effects. Call your doctor or get medical help if any of these side effects or any other side effects bother you or do not go away:   Upset stomach.   These are not all of the side effects that may occur. If you have questions about side effects, call your doctor. Call your doctor for medical advice about side effects.   You may report side effects to your national health agency.  How is this drug best taken?   Use this drug as ordered by your doctor. Read all information given to you. Follow all instructions closely.   Do not push the tablet out of the foil when opening. Use dry hands to take it from the foil.  Place on your tongue and let it dissolve. Water is not needed. Do not swallow it whole. Do not chew, break, or crush it.   If needed, you may place the tablet under the tongue.   Use right after opening.  What do I do if I miss a dose?   This drug is taken on an as needed basis. Do not take more often than told by the doctor.  How do I store and/or throw out this drug?   Store at room temperature in a dry place. Do not store in a bathroom.   Store in foil pouch until ready for use.   Keep all drugs in a safe place. Keep all drugs out of the reach of children and pets.   Throw away unused or expired drugs. Do not flush down a toilet or pour down a drain  unless you are told to do so. Check with your pharmacist if you have questions about the best way to throw out drugs. There may be drug take-back programs in your area.  General drug facts   If your symptoms or health problems do not get better or if they become worse, call your doctor.   Do not share your drugs with others and do not take anyone else's drugs.   Some drugs may have another patient information leaflet. If you have any questions about this drug, please talk with your doctor, nurse, pharmacist, or other health care provider.   If you think there has been an overdose, call your poison control center or get medical care right away. Be ready to tell or show what was taken, how much, and when it happened.   Topiramate tablets What is this medicine? TOPIRAMATE (toe PYRE a mate) is used to treat seizures in adults or children with epilepsy. It is also used for the prevention of migraine headaches. This medicine may be used for other purposes; ask your health care provider or pharmacist if you have questions. COMMON BRAND NAME(S): Topamax, Topiragen What should I tell my health care provider before I take this medicine? They need to know if you have any of these conditions:  bleeding disorders  cirrhosis of the liver or liver disease  diarrhea  glaucoma  kidney stones or kidney disease  low blood counts, like low white cell, platelet, or red cell counts  lung disease like asthma, obstructive pulmonary disease, emphysema  metabolic acidosis  on a ketogenic diet  schedule for surgery or a procedure  suicidal thoughts, plans, or attempt; a previous suicide attempt by you or a family member  an unusual or allergic reaction to topiramate, other medicines, foods, dyes, or preservatives  pregnant or trying to get pregnant  breast-feeding How should I use this medicine? Take this medicine by mouth with a glass of water. Follow the directions on the prescription label. Do  not crush or chew. You may take this medicine with meals. Take your medicine at regular intervals. Do not take it more often than directed. Talk to your pediatrician regarding the use of this medicine in children. Special care may be needed. While this drug may be prescribed for children as young as 67 years of age for selected conditions, precautions do apply. Overdosage: If you think you have taken too much of this medicine contact a poison control center or emergency room at once. NOTE: This medicine is only for you. Do not share this medicine with others. What if I miss a dose? If you miss a  dose, take it as soon as you can. If your next dose is to be taken in less than 6 hours, then do not take the missed dose. Take the next dose at your regular time. Do not take double or extra doses. What may interact with this medicine? Do not take this medicine with any of the following medications:  probenecid This medicine may also interact with the following medications:  acetazolamide  alcohol  amitriptyline  aspirin and aspirin-like medicines  birth control pills  certain medicines for depression  certain medicines for seizures  certain medicines that treat or prevent blood clots like warfarin, enoxaparin, dalteparin, apixaban, dabigatran, and rivaroxaban  digoxin  hydrochlorothiazide  lithium  medicines for pain, sleep, or muscle relaxation  metformin  methazolamide  NSAIDS, medicines for pain and inflammation, like ibuprofen or naproxen  pioglitazone  risperidone This list may not describe all possible interactions. Give your health care provider a list of all the medicines, herbs, non-prescription drugs, or dietary supplements you use. Also tell them if you smoke, drink alcohol, or use illegal drugs. Some items may interact with your medicine. What should I watch for while using this medicine? Visit your doctor or health care professional for regular checks on your  progress. Do not stop taking this medicine suddenly. This increases the risk of seizures if you are using this medicine to control epilepsy. Wear a medical identification bracelet or chain to say you have epilepsy or seizures, and carry a card that lists all your medicines. This medicine can decrease sweating and increase your body temperature. Watch for signs of deceased sweating or fever, especially in children. Avoid extreme heat, hot baths, and saunas. Be careful about exercising, especially in hot weather. Contact your health care provider right away if you notice a fever or decrease in sweating. You should drink plenty of fluids while taking this medicine. If you have had kidney stones in the past, this will help to reduce your chances of forming kidney stones. If you have stomach pain, with nausea or vomiting and yellowing of your eyes or skin, call your doctor immediately. You may get drowsy, dizzy, or have blurred vision. Do not drive, use machinery, or do anything that needs mental alertness until you know how this medicine affects you. To reduce dizziness, do not sit or stand up quickly, especially if you are an older patient. Alcohol can increase drowsiness and dizziness. Avoid alcoholic drinks. If you notice blurred vision, eye pain, or other eye problems, seek medical attention at once for an eye exam. The use of this medicine may increase the chance of suicidal thoughts or actions. Pay special attention to how you are responding while on this medicine. Any worsening of mood, or thoughts of suicide or dying should be reported to your health care professional right away. This medicine may increase the chance of developing metabolic acidosis. If left untreated, this can cause kidney stones, bone disease, or slowed growth in children. Symptoms include breathing fast, fatigue, loss of appetite, irregular heartbeat, or loss of consciousness. Call your doctor immediately if you experience any of these  side effects. Also, tell your doctor about any surgery you plan on having while taking this medicine since this may increase your risk for metabolic acidosis. Birth control pills may not work properly while you are taking this medicine. Talk to your doctor about using an extra method of birth control. Women who become pregnant while using this medicine may enroll in the [place] Antiepileptic Drug  Pregnancy Registry by calling [id number]. This registry collects information about the safety of antiepileptic drug use during pregnancy. What side effects may I notice from receiving this medicine? Side effects that you should report to your doctor or health care professional as soon as possible:  allergic reactions like skin rash, itching or hives, swelling of the face, lips, or tongue  decreased sweating and/or rise in body temperature  depression  difficulty breathing, fast or irregular breathing patterns  difficulty speaking  difficulty walking or controlling muscle movements  hearing impairment  redness, blistering, peeling or loosening of the skin, including inside the mouth  tingling, pain or numbness in the hands or feet  unusual bleeding or bruising  unusually weak or tired  worsening of mood, thoughts or actions of suicide or dying Side effects that usually do not require medical attention (report to your doctor or health care professional if they continue or are bothersome):  altered taste  back pain, joint or muscle aches and pains  diarrhea, or constipation  headache  loss of appetite  nausea  stomach upset, indigestion  tremors This list may not describe all possible side effects. Call your doctor for medical advice about side effects. You may report side effects to FDA at 1-800-FDA-1088. Where should I keep my medicine? Keep out of the reach of children. Store at room temperature between 15 and 30 degrees C (59 and 86 degrees F) in a tightly closed  container. Protect from moisture. Throw away any unused medicine after the expiration date. NOTE: This sheet is a summary. It may not cover all possible information. If you have questions about this medicine, talk to your doctor, pharmacist, or health care provider.  2020 Elsevier/Gold Standard (2013-06-19 23:17:57)   Rolanda Lundborg injection What is this medicine? FREMANEZUMAB (fre ma NEZ ue mab) is used to prevent migraine headaches. This medicine may be used for other purposes; ask your health care provider or pharmacist if you have questions. COMMON BRAND NAME(S): AJOVY What should I tell my health care provider before I take this medicine? They need to know if you have any of these conditions:  an unusual or allergic reaction to fremanezumab, other medicines, foods, dyes, or preservatives  pregnant or trying to get pregnant  breast-feeding How should I use this medicine? This medicine is for injection under the skin. You will be taught how to prepare and give this medicine. Use exactly as directed. Take your medicine at regular intervals. Do not take your medicine more often than directed. It is important that you put your used needles and syringes in a special sharps container. Do not put them in a trash can. If you do not have a sharps container, call your pharmacist or healthcare provider to get one. Talk to your pediatrician regarding the use of this medicine in children. Special care may be needed. Overdosage: If you think you have taken too much of this medicine contact a poison control center or emergency room at once. NOTE: This medicine is only for you. Do not share this medicine with others. What if I miss a dose? If you miss a dose, take it as soon as you can. If it is almost time for your next dose, take only that dose. Do not take double or extra doses. What may interact with this medicine? Interactions are not expected. This list may not describe all possible  interactions. Give your health care provider a list of all the medicines, herbs, non-prescription drugs, or dietary supplements you  use. Also tell them if you smoke, drink alcohol, or use illegal drugs. Some items may interact with your medicine. What should I watch for while using this medicine? Tell your doctor or healthcare professional if your symptoms do not start to get better or if they get worse. What side effects may I notice from receiving this medicine? Side effects that you should report to your doctor or health care professional as soon as possible:  allergic reactions like skin rash, itching or hives, swelling of the face, lips, or tongue Side effects that usually do not require medical attention (report these to your doctor or health care professional if they continue or are bothersome):  pain, redness, or irritation at site where injected This list may not describe all possible side effects. Call your doctor for medical advice about side effects. You may report side effects to FDA at 1-800-FDA-1088. Where should I keep my medicine? Keep out of the reach of children. You will be instructed on how to store this medicine. Throw away any unused medicine after the expiration date on the label. NOTE: This sheet is a summary. It may not cover all possible information. If you have questions about this medicine, talk to your doctor, pharmacist, or health care provider.  2020 Elsevier/Gold Standard (2017-03-15 17:22:56)  . Alprazolam tablets What is this medicine? ALPRAZOLAM (al PRAY zoe lam) is a benzodiazepine. It is used to treat anxiety and panic attacks. This medicine may be used for other purposes; ask your health care provider or pharmacist if you have questions. COMMON BRAND NAME(S): Xanax What should I tell my health care provider before I take this medicine? They need to know if you have any of these conditions:  an alcohol or drug abuse problem  bipolar disorder,  depression, psychosis or other mental health conditions  glaucoma  kidney or liver disease  lung or breathing disease  myasthenia gravis  Parkinson's disease  porphyria  seizures or a history of seizures  suicidal thoughts  an unusual or allergic reaction to alprazolam, other benzodiazepines, foods, dyes, or preservatives  pregnant or trying to get pregnant  breast-feeding How should I use this medicine? Take this medicine by mouth with a glass of water. Follow the directions on the prescription label. Take your medicine at regular intervals. Do not take it more often than directed. Do not stop taking except on your doctor's advice. A special MedGuide will be given to you by the pharmacist with each prescription and refill. Be sure to read this information carefully each time. Talk to your pediatrician regarding the use of this medicine in children. Special care may be needed. Overdosage: If you think you have taken too much of this medicine contact a poison control center or emergency room at once. NOTE: This medicine is only for you. Do not share this medicine with others. What if I miss a dose? If you miss a dose, take it as soon as you can. If it is almost time for your next dose, take only that dose. Do not take double or extra doses. What may interact with this medicine? Do not take this medicine with any of the following medications:  certain antiviral medicines for HIV or AIDS like delavirdine, indinavir  certain medicines for fungal infections like ketoconazole and itraconazole  narcotic medicines for cough  sodium oxybate This medicine may also interact with the following medications:  alcohol  antihistamines for allergy, cough and cold  certain antibiotics like clarithromycin, erythromycin, isoniazid, rifampin, rifapentine,  rifabutin, and troleandomycin  certain medicines for blood pressure, heart disease, irregular heart beat  certain medicines for  depression, like amitriptyline, fluoxetine, sertraline  certain medicines for seizures like carbamazepine, oxcarbazepine, phenobarbital, phenytoin, primidone  cimetidine  cyclosporine  female hormones, like estrogens or progestins and birth control pills, patches, rings, or injections  general anesthetics like halothane, isoflurane, methoxyflurane, propofol  grapefruit juice  local anesthetics like lidocaine, pramoxine, tetracaine  medicines that relax muscles for surgery  narcotic medicines for pain  other antiviral medicines for HIV or AIDS  phenothiazines like chlorpromazine, mesoridazine, prochlorperazine, thioridazine This list may not describe all possible interactions. Give your health care provider a list of all the medicines, herbs, non-prescription drugs, or dietary supplements you use. Also tell them if you smoke, drink alcohol, or use illegal drugs. Some items may interact with your medicine. What should I watch for while using this medicine? Tell your doctor or health care professional if your symptoms do not start to get better or if they get worse. Do not stop taking except on your doctor's advice. You may develop a severe reaction. Your doctor will tell you how much medicine to take. You may get drowsy or dizzy. Do not drive, use machinery, or do anything that needs mental alertness until you know how this medicine affects you. To reduce the risk of dizzy and fainting spells, do not stand or sit up quickly, especially if you are an older patient. Alcohol may increase dizziness and drowsiness. Avoid alcoholic drinks. If you are taking another medicine that also causes drowsiness, you may have more side effects. Give your health care provider a list of all medicines you use. Your doctor will tell you how much medicine to take. Do not take more medicine than directed. Call emergency for help if you have problems breathing or unusual sleepiness. What side effects may I notice  from receiving this medicine? Side effects that you should report to your doctor or health care professional as soon as possible:  allergic reactions like skin rash, itching or hives, swelling of the face, lips, or tongue  breathing problems  confusion  loss of balance or coordination  signs and symptoms of low blood pressure like dizziness; feeling faint or lightheaded, falls; unusually weak or tired  suicidal thoughts or other mood changes Side effects that usually do not require medical attention (report to your doctor or health care professional if they continue or are bothersome):  dizziness  dry mouth  nausea, vomiting  tiredness This list may not describe all possible side effects. Call your doctor for medical advice about side effects. You may report side effects to FDA at 1-800-FDA-1088. Where should I keep my medicine? Keep out of the reach of children. This medicine can be abused. Keep your medicine in a safe place to protect it from theft. Do not share this medicine with anyone. Selling or giving away this medicine is dangerous and against the law. Store at room temperature between 20 and 25 degrees C (68 and 77 degrees F). This medicine may cause accidental overdose and death if taken by other adults, children, or pets. Mix any unused medicine with a substance like cat litter or coffee grounds. Then throw the medicine away in a sealed container like a sealed bag or a coffee can with a lid. Do not use the medicine after the expiration date. NOTE: This sheet is a summary. It may not cover all possible information. If you have questions about this medicine, talk to  your doctor, pharmacist, or health care provider.  2020 Elsevier/Gold Standard (2015-03-14 13:47:25)

## 2019-06-07 ENCOUNTER — Other Ambulatory Visit: Payer: Self-pay | Admitting: Neurology

## 2019-06-07 ENCOUNTER — Telehealth: Payer: Self-pay | Admitting: Neurology

## 2019-06-07 NOTE — Telephone Encounter (Signed)
This was prescribed 02/21/2019 x 11 refills. I called [facility] on [address] and spoke with Robin. She said the pt must have used an old refill # and it triggered a request. She will take care of this for the pt.

## 2019-06-07 NOTE — Telephone Encounter (Signed)
Pt is needing a refill on her diclofenac (VOLTAREN) 50 MG EC tablet sent in to the [facility] on [address]

## 2019-07-03 ENCOUNTER — Other Ambulatory Visit: Payer: Self-pay | Admitting: Neurology

## 2019-07-04 ENCOUNTER — Telehealth: Payer: Self-pay

## 2019-07-04 NOTE — Telephone Encounter (Signed)
The recommendation is not to take more than 12 a month even if insurance is paying for it. Taking more than this a month can cause rebound headache and other side effects. I would prefer not to. If she needs to take 38 maxalt a month then maybe she needs a follow up to discuss it, thanks

## 2019-07-04 NOTE — Telephone Encounter (Signed)
1) Medication(s) Requested (by name): rizatriptan (MAXALT) 10 MG tablet   2) Pharmacy [facility]: [id number] [place], [address] [place] [address]   3) Special Requests:   Patient called to inform that she was updated by her insurance that they will cover 18 tablets instead of 12 and would like to know if she could get a new prescription for this medication with 18 tablets. Please follow up

## 2019-07-04 NOTE — Telephone Encounter (Signed)
I spoke with the patient and explained the message from Dr. Jaynee Eagles. Pt understands. She has had an increase in the need of Maxalt d/t the drastic changes in the weather lately. I discussed the Nurtec with the patient. She will try to get this filled from the pharmacy. She understands a PA will be needed and also to download a savings card from the website. Pt aware of the instructions. The pt will call back if she needs an appt. She verbalized appreciation for the call.

## 2019-07-10 ENCOUNTER — Other Ambulatory Visit: Payer: Self-pay | Admitting: Neurology

## 2019-08-08 ENCOUNTER — Other Ambulatory Visit: Payer: Self-pay | Admitting: Gastroenterology

## 2019-08-08 NOTE — Telephone Encounter (Signed)
Refill request for Linzess 145 mcg. Last seen 05-2018. Refilled for 30 days and sent her a mychart message to inform her.

## 2019-08-11 NOTE — Telephone Encounter (Signed)
Agreed.  Can be refilled enough to allow time to have a visit with me.  Telemedicine OK

## 2019-08-18 ENCOUNTER — Other Ambulatory Visit: Payer: Self-pay | Admitting: Neurology

## 2019-08-18 DIAGNOSIS — G43709 Chronic migraine without aura, not intractable, without status migrainosus: Secondary | ICD-10-CM

## 2019-09-07 ENCOUNTER — Other Ambulatory Visit: Payer: Self-pay | Admitting: Gastroenterology

## 2019-09-12 NOTE — Progress Notes (Signed)
[facility]    Provider:  Dr Jaynee Eagles Referring Provider: Osborne Casco Fransico Him, MD Primary Care Physician:  Haywood Pao, MD  CC:  Migraine  Interval history 09/13/2019: She is smelling strong cigarette smoke, happens often, no hx of seizures, she has sinus problems and I recommended starting with ENT, it is associated with masks and adding moisture in the nose helps and she uses saline spray. We discussed Ajovy again. We discussed Ajovy, nurtec. Discussed Frovatriptan and long-acting triptans. I can't refill her xanax, we had a talk about that.    Interval history 02/21/2019: Here for follow up of migraines. She tried the Manuel Garcia but she had constipation and could not tolerate. She saw Dr. [name] in the past and has been on multiple meds in the past. Worsening migraines. She is having most of the month if headaches and 10 days a month of migraines. She only took the [place]. Recommended Ajovy. Discussed oral medication.   HPI 11/11/2018:  [name] is a 54 y.o. female here as a referral from Dr. Osborne Casco for migraine. PMHX migraines, irritable bowel symptom, endometriosis, anxiety, anemia.  Patient is transitioning from 1 of my colleagues to me for migraine management.  Patient was previously seen by Dr. love.  Patient has a history of migraines for over 20 years.  She has an extensive family history of migraines.  She has been on multiple medications including Topamax up to 200 mg a day.  Her headaches are normally located in the right side of the head, throbbing and pulsating, she has osmophobia and smells can trigger, she endorses nausea, so pounding that she can't move, can be severe, also light sensitivity, no aura. 20 headache days a month, more than 10 are migrainous, can be severe and can last > 24 hours without treatment. No medication overuse.   Medications tried include: Gabapentin, topiramate up to 200 mg daily, steroids, Cymbalta, diclofenac, Zofran, Mobic, blood  pressure medications Spironolactone, tizanidine  Reviewed notes, labs and imaging from outside physicians, which showed:  Reviewed EMG study, data and results which was normal no electrodiagnostic evidence of large fiber peripheral neuropathy or right lumbosacral radiculopathy.  MRI of the brain images which I reviewed was normal November 2007.  Reviewed prior neurology notes: Headaches began in childhood since the age of 15.  Neuro is in the left side, and related to her menstrual cycle, and not associated with disturbances of nausea or vomiting or visual but does have a history of endometriosis.  Headaches last intermittently for 5 days and are improved with ketoprofen, Maxalt therapy and coffee.  Her daughter has been to the headache clinic and uses Biofreeze to the scalp as well as Maxalt.  Extensive family history of the patient's daughter, son, grandmother and mother have migraines.  Her migraines are throbbing, she has smell sensitivity and triggers and an ice pick quality.  She has been on multiple packs of steroids.  She has chronic neck pain.  IV Depacon have worked acutely for her migraines in the past  Review of Systems: Patient complains of symptoms per HPI as well as the following symptoms: headache. Pertinent negatives and positives per HPI. All others negative.   Social History   Socioeconomic History  . Marital status: Married    Spouse name: [name]  . Number of children: 2  . Years of education: Not on file  . Highest education level: Associate degree: academic program  Occupational History  . Occupation: stay at home  Tobacco Use  .  Smoking status: Former Research scientist (life sciences)  . Smokeless tobacco: Never Used  Substance and Sexual Activity  . Alcohol use: No    Alcohol/week: 0.0 standard drinks  . Drug use: No  . Sexual activity: Yes    Birth control/protection: Surgical  Other Topics Concern  . Not on file  Social History Narrative   Patient lives at home with her husband  [name]. Drinks two cups of caffeine daily. Patient has two years of college education. Right handed.   Social Determinants of Health   Financial Resource Strain:   . Difficulty of Paying Living Expenses:   Food Insecurity:   . Worried About Charity fundraiser in the Last Year:   . Arboriculturist in the Last Year:   Transportation Needs:   . Film/video editor (Medical):   Marland Kitchen Lack of Transportation (Non-Medical):   Physical Activity:   . Days of Exercise per Week:   . Minutes of Exercise per Session:   Stress:   . Feeling of Stress :   Social Connections:   . Frequency of Communication with Friends and Family:   . Frequency of Social Gatherings with Friends and Family:   . Attends Religious Services:   . Active Member of Clubs or Organizations:   . Attends Archivist Meetings:   Marland Kitchen Marital Status:   Intimate Partner Violence:   . Fear of Current or Ex-Partner:   . Emotionally Abused:   Marland Kitchen Physically Abused:   . Sexually Abused:     Family History  Problem Relation Age of Onset  . Diabetes Mother        both sides grandparents  . Hypertension Mother   . Colon polyps Mother   . Thyroid disease Father   . Colon polyps Father   . Colon cancer Neg Hx   . Stomach cancer Neg Hx   . Esophageal cancer Neg Hx     Past Medical History:  Diagnosis Date  . Anemia   . Anxiety   . Brachial neuritis or radiculitis NOS   . Endometriosis   . H/O: hysterectomy   . IBS (irritable bowel syndrome)   . Migraine, unspecified, without mention of intractable migraine without mention of status migrainosus   . Migraines   . Neuropathy   . Shoulder pain     Past Surgical History:  Procedure Laterality Date  . ABDOMINAL HYSTERECTOMY    . CHOLECYSTECTOMY    . CYSTOSCOPY  11/17/2011   Procedure: CYSTOSCOPY;  Surgeon: Darlyn Chamber, MD;  Location: [facility];  Service: Gynecology;;  . DIAGNOSTIC LAPAROSCOPY    . LAPAROSCOPY  11/17/2011   Procedure: LAPAROSCOPY OPERATIVE;   Surgeon: Darlyn Chamber, MD;  Location: [facility];  Service: Gynecology;  Laterality: N/A;  with Injection of Vaginal Cuff with Dexamethasone mixed in 0.5% Bupivacaine  . NASAL SEPTUM SURGERY     bone spur    Current Outpatient Medications  Medication Sig Dispense Refill  . diclofenac (VOLTAREN) 50 MG EC tablet Take 1 tablet (50 mg total) by mouth as needed. Max 4x a day 20 tablet 1  . linaclotide (LINZESS) 145 MCG CAPS capsule TAKE 1 CAPSULE BY MOUTH EVERY DAY BEFORE BREAKFAST Please schedule a follow up for refills 30 capsule 0  . methylPREDNISolone (MEDROL DOSEPAK) 4 MG TBPK tablet May take daily pills each morning with food. Take for 6 days. 21 tablet 1  . OVER THE COUNTER MEDICATION Takes claritin or Zyrtec    . Rimegepant Sulfate (NURTEC)  75 MG TBDP Take 75 mg by mouth daily as needed. For migraines. Take as close to onset of migraine as possible. One daily maximum. 10 tablet 6  . rizatriptan (MAXALT) 10 MG tablet May repeat in 2 hours if needed 18 tablet 11  . spironolactone (ALDACTONE) 25 MG tablet Take 25 mg by mouth daily.    Marland Kitchen topiramate (TOPAMAX) 50 MG tablet TAKE 1 TABLET AT BEDTIME FOR 1 WEEK, THEN INCREASE TO 2 TABLETS AT BEDTIME 90 tablet 4  . tretinoin (RETIN-A) 0.025 % cream as needed.   3  . Fremanezumab-vfrm (AJOVY) 225 MG/1.5ML SOAJ Inject 225 mg into the skin every 30 (thirty) days. 1 pen 11   No current facility-administered medications for this visit.    Allergies as of 09/13/2019 - Review Complete 09/13/2019  Allergen Reaction Noted  . Tylox [oxycodone-acetaminophen] Itching 11/20/2011  . Codeine  08/05/2011    Vitals: BP 117/80 (BP Location: Right Arm, Patient Position: Sitting)   Pulse 73   Temp (!) 96.9 F (36.1 C) Comment: taken at front  Ht 5\' 8"  (1.727 m)   Wt 161 lb (73 kg)   BMI 24.48 kg/m  Last Weight:  Wt Readings from Last 1 Encounters:  09/13/19 161 lb (73 kg)   Last Height:   Ht Readings from Last 1 Encounters:  09/13/19 5\' 8"  (1.727 m)    Physical exam: Exam: Gen: NAD, conversant, well nourised, obese, well groomed                     CV: RRR, no MRG. No Carotid Bruits. No peripheral edema, warm, nontender Eyes: Conjunctivae clear without exudates or hemorrhage  Neuro: Detailed Neurologic Exam  Speech:    Speech is normal; fluent and spontaneous with normal comprehension.  Cognition:    The patient is oriented to person, place, and time;     recent and remote memory intact;     language fluent;     normal attention, concentration,     fund of knowledge Cranial Nerves:    The pupils are equal, round, and reactive to light. The fundi are normal and spontaneous venous pulsations are present. Visual fields are full to finger confrontation. Extraocular movements are intact. Trigeminal sensation is intact and the muscles of mastication are normal. The face is symmetric. The palate elevates in the midline. Hearing intact. Voice is normal. Shoulder shrug is normal. The tongue has normal motion without fasciculations.   Coordination:    Normal finger to nose and heel to shin. Normal rapid alternating movements.   Gait:    Heel-toe and tandem gait are normal.   Motor Observation:    No asymmetry, no atrophy, and no involuntary movements noted. Tone:    Normal muscle tone.    Posture:    Posture is normal. normal erect    Strength:    Strength is V/V in the upper and lower limbs.      Sensation: intact to LT     Reflex Exam:  DTR's:    Deep tendon reflexes in the upper and lower extremities are normal bilaterally.   Toes:    The toes are downgoing bilaterally.   Clonus:    Clonus is absent.     Assessment/Plan:  54 year old female with chronic intractable migraines. Failed multiple classes of medications still with considerable burden of migraines.  Recommend Botox for migraine but she would like to try Ajovy, sent script Topamax, Nurtec for acute management Cannot refill xanax  Gave her more maxalt,  discussed medication overuse headache, use no more than 10 days a month Refill maxalt and diclofenac.  Prednisone to keep on hand for severe migraine intractable to acute meds.   Meds ordered this encounter  Medications  . rizatriptan (MAXALT) 10 MG tablet    Sig: May repeat in 2 hours if needed    Dispense:  18 tablet    Refill:  11    May only fill once per month.  . Fremanezumab-vfrm (AJOVY) 225 MG/1.5ML SOAJ    Sig: Inject 225 mg into the skin every 30 (thirty) days.    Dispense:  1 pen    Refill:  11    Patient has copay card; she can have medication for $5 regardless of insurance approval or copay amount.  . topiramate (TOPAMAX) 50 MG tablet    Sig: TAKE 1 TABLET AT BEDTIME FOR 1 WEEK, THEN INCREASE TO 2 TABLETS AT BEDTIME    Dispense:  90 tablet    Refill:  4  . methylPREDNISolone (MEDROL DOSEPAK) 4 MG TBPK tablet    Sig: May take daily pills each morning with food. Take for 6 days.    Dispense:  21 tablet    Refill:  1  . diclofenac (VOLTAREN) 50 MG EC tablet    Sig: Take 1 tablet (50 mg total) by mouth as needed. Max 4x a day    Dispense:  20 tablet    Refill:  1     Discussed: To prevent or relieve headaches, try the following: Cool Compress. Lie down and place a cool compress on your head.  Avoid headache triggers. If certain foods or odors seem to have triggered your migraines in the past, avoid them. A headache diary might help you identify triggers.  Include physical activity in your daily routine. Try a daily walk or other moderate aerobic exercise.  Manage stress. Find healthy ways to cope with the stressors, such as delegating tasks on your to-do list.  Practice relaxation techniques. Try deep breathing, yoga, massage and visualization.  Eat regularly. Eating regularly scheduled meals and maintaining a healthy diet might help prevent headaches. Also, drink plenty of fluids.  Follow a regular sleep schedule. Sleep deprivation might contribute to  headaches Consider biofeedback. With this mind-body technique, you learn to control certain bodily functions -- such as muscle tension, heart rate and blood pressure -- to prevent headaches or reduce headache pain.    Proceed to emergency room if you experience new or worsening symptoms or symptoms do not resolve, if you have new neurologic symptoms or if headache is severe, or for any concerning symptom.   Provided education and documentation from American headache Society toolbox including articles on: chronic migraine medication overuse headache, chronic migraines, prevention of migraines, behavioral and other nonpharmacologic treatments for headache.    Sarina Ill, MD  [facility] [address] [place], [place] [address]  Phone [phone] Fax [phone]  A total of 25 minutes was spent face-to-face with this patient. Over half this time was spent on counseling patient on the chronic migraine diagnosis and different diagnostic and therapeutic options, counseling and coordination of care, risks ans benefits of management, compliance, risk factor reduction and education.

## 2019-09-13 ENCOUNTER — Encounter: Payer: Self-pay | Admitting: Neurology

## 2019-09-13 ENCOUNTER — Other Ambulatory Visit: Payer: Self-pay

## 2019-09-13 ENCOUNTER — Ambulatory Visit: Payer: BC Managed Care – PPO | Admitting: Neurology

## 2019-09-13 VITALS — BP 117/80 | HR 73 | Temp 96.9°F | Ht 68.0 in | Wt 161.0 lb

## 2019-09-13 DIAGNOSIS — G43709 Chronic migraine without aura, not intractable, without status migrainosus: Secondary | ICD-10-CM | POA: Diagnosis not present

## 2019-09-13 MED ORDER — RIZATRIPTAN BENZOATE 10 MG PO TABS: ORAL_TABLET | ORAL | 11 refills | Status: DC

## 2019-09-13 MED ORDER — TOPIRAMATE 50 MG PO TABS: ORAL_TABLET | ORAL | 4 refills | Status: DC

## 2019-09-13 MED ORDER — DICLOFENAC SODIUM 50 MG PO TBEC: 50.0000 mg | DELAYED_RELEASE_TABLET | ORAL | 1 refills | Status: DC | PRN

## 2019-09-13 MED ORDER — AJOVY 225 MG/1.5ML ~~LOC~~ SOAJ: 225.0000 mg | SUBCUTANEOUS | 11 refills | Status: DC

## 2019-09-13 MED ORDER — METHYLPREDNISOLONE 4 MG PO TBPK: ORAL_TABLET | ORAL | 1 refills | Status: DC

## 2019-09-13 NOTE — Patient Instructions (Signed)
Try Nurtec, you can take it with the maxalt  Rimegepant oral dissolving tablet What is this medicine? RIMEGEPANT (ri ME je pant) is used to treat migraine headaches with or without aura. An aura is a strange feeling or visual disturbance that warns you of an attack. It is not used to prevent migraines. This medicine may be used for other purposes; ask your health care provider or pharmacist if you have questions. COMMON BRAND NAME(S): NURTEC ODT What should I tell my health care provider before I take this medicine? They need to know if you have any of these conditions:  kidney disease  liver disease  an unusual or allergic reaction to rimegepant, other medicines, foods, dyes, or preservatives  pregnant or trying to get pregnant  breast-feeding How should I use this medicine? Take the medicine by mouth. Follow the directions on the prescription label. Leave the tablet in the sealed blister pack until you are ready to take it. With dry hands, open the blister and gently remove the tablet. If the tablet breaks or crumbles, throw it away and take a new tablet out of the blister pack. Place the tablet in the mouth and allow it to dissolve, and then swallow. Do not cut, crush, or chew this medicine. You do not need water to take this medicine. Talk to your pediatrician about the use of this medicine in children. Special care may be needed. Overdosage: If you think you have taken too much of this medicine contact a poison control center or emergency room at once. NOTE: This medicine is only for you. Do not share this medicine with others. What if I miss a dose? This does not apply. This medicine is not for regular use. What may interact with this medicine? This medicine may interact with the following medications:  certain medicines for fungal infections like fluconazole, itraconazole  rifampin This list may not describe all possible interactions. Give your health care provider a list of  all the medicines, herbs, non-prescription drugs, or dietary supplements you use. Also tell them if you smoke, drink alcohol, or use illegal drugs. Some items may interact with your medicine. What should I watch for while using this medicine? Visit your health care professional for regular checks on your progress. Tell your health care professional if your symptoms do not start to get better or if they get worse. What side effects may I notice from receiving this medicine? Side effects that you should report to your doctor or health care professional as soon as possible:  allergic reactions like skin rash, itching or hives; swelling of the face, lips, or tongue Side effects that usually do not require medical attention (report these to your doctor or health care professional if they continue or are bothersome):  nausea This list may not describe all possible side effects. Call your doctor for medical advice about side effects. You may report side effects to FDA at 1-800-FDA-1088. Where should I keep my medicine? Keep out of the reach of children. Store at room temperature between 15 and 30 degrees C (59 and 86 degrees F). Throw away any unused medicine after the expiration date. NOTE: This sheet is a summary. It may not cover all possible information. If you have questions about this medicine, talk to your doctor, pharmacist, or health care provider.  2020 Elsevier/Gold Standard (2018-08-29 00:21:31) Rolanda Lundborg injection What is this medicine? FREMANEZUMAB (fre ma NEZ ue mab) is used to prevent migraine headaches. This medicine may be used for other  purposes; ask your health care provider or pharmacist if you have questions. COMMON BRAND NAME(S): AJOVY What should I tell my health care provider before I take this medicine? They need to know if you have any of these conditions:  an unusual or allergic reaction to fremanezumab, other medicines, foods, dyes, or preservatives  pregnant or  trying to get pregnant  breast-feeding How should I use this medicine? This medicine is for injection under the skin. You will be taught how to prepare and give this medicine. Use exactly as directed. Take your medicine at regular intervals. Do not take your medicine more often than directed. It is important that you put your used needles and syringes in a special sharps container. Do not put them in a trash can. If you do not have a sharps container, call your pharmacist or healthcare provider to get one. Talk to your pediatrician regarding the use of this medicine in children. Special care may be needed. Overdosage: If you think you have taken too much of this medicine contact a poison control center or emergency room at once. NOTE: This medicine is only for you. Do not share this medicine with others. What if I miss a dose? If you miss a dose, take it as soon as you can. If it is almost time for your next dose, take only that dose. Do not take double or extra doses. What may interact with this medicine? Interactions are not expected. This list may not describe all possible interactions. Give your health care provider a list of all the medicines, herbs, non-prescription drugs, or dietary supplements you use. Also tell them if you smoke, drink alcohol, or use illegal drugs. Some items may interact with your medicine. What should I watch for while using this medicine? Tell your doctor or healthcare professional if your symptoms do not start to get better or if they get worse. What side effects may I notice from receiving this medicine? Side effects that you should report to your doctor or health care professional as soon as possible:  allergic reactions like skin rash, itching or hives, swelling of the face, lips, or tongue Side effects that usually do not require medical attention (report these to your doctor or health care professional if they continue or are bothersome):  pain, redness, or  irritation at site where injected This list may not describe all possible side effects. Call your doctor for medical advice about side effects. You may report side effects to FDA at 1-800-FDA-1088. Where should I keep my medicine? Keep out of the reach of children. You will be instructed on how to store this medicine. Throw away any unused medicine after the expiration date on the label. NOTE: This sheet is a summary. It may not cover all possible information. If you have questions about this medicine, talk to your doctor, pharmacist, or health care provider.  2020 Elsevier/Gold Standard (2017-03-15 17:22:56)

## 2019-09-14 ENCOUNTER — Telehealth: Payer: Self-pay | Admitting: *Deleted

## 2019-09-14 NOTE — Telephone Encounter (Signed)
Completed Ajovy PA on Cover My Meds. Key: [id number]. Awaiting determination from [facility].

## 2019-09-20 NOTE — Telephone Encounter (Signed)
Received fax from [facility] of additional PA information they need for initial request. Completed form, printed office note. Pending MD signature.

## 2019-09-22 NOTE — Telephone Encounter (Signed)
[facility] called to inform of the denial letter being faxed to the office for Ajovy. If there are questions they can be called back at [phone] option 3 then option 2

## 2019-09-25 ENCOUNTER — Encounter: Payer: Self-pay | Admitting: *Deleted

## 2019-09-25 NOTE — Telephone Encounter (Addendum)
Received fax from [facility]. Ajovy denied. Require trial of Aimovig and Emgality first.   Patient should be able to use the savings card. I sent her a Pharmacist, community message.

## 2019-10-05 ENCOUNTER — Ambulatory Visit: Payer: Self-pay | Attending: Internal Medicine

## 2019-10-05 DIAGNOSIS — Z23 Encounter for immunization: Secondary | ICD-10-CM

## 2019-10-05 NOTE — Progress Notes (Signed)
   Covid-19 Vaccination Clinic  Name:  [name]    MRN: [id number] DOB: 05-26-1966  10/05/2019  [name] was observed post Covid-19 immunization for 15 minutes without incident. She was provided with Vaccine Information Sheet and instruction to access the V-Safe system.   [name] was instructed to call 911 with any severe reactions post vaccine: Marland Kitchen Difficulty breathing  . Swelling of face and throat  . A fast heartbeat  . A bad rash all over body  . Dizziness and weakness   Immunizations Administered    Name Date Dose VIS Date Route   Pfizer COVID-19 Vaccine 10/05/2019 10:00 AM 0.3 mL 06/09/2019 Intramuscular   Manufacturer: [employer], [employer]   Lot: [id number]   [place]: [id number]

## 2019-10-31 ENCOUNTER — Ambulatory Visit: Payer: Self-pay | Attending: Internal Medicine

## 2019-10-31 DIAGNOSIS — Z23 Encounter for immunization: Secondary | ICD-10-CM

## 2019-10-31 NOTE — Progress Notes (Signed)
   Covid-19 Vaccination Clinic  Name:  [name]    MRN: [id number] DOB: 1966/06/13  10/31/2019  [name] was observed post Covid-19 immunization for 15 minutes without incident. She was provided with Vaccine Information Sheet and instruction to access the V-Safe system.   [name] was instructed to call 911 with any severe reactions post vaccine: Marland Kitchen Difficulty breathing  . Swelling of face and throat  . A fast heartbeat  . A bad rash all over body  . Dizziness and weakness   Immunizations Administered    Name Date Dose VIS Date Route   Pfizer COVID-19 Vaccine 10/31/2019  9:59 AM 0.3 mL 08/23/2018 Intramuscular   Manufacturer: [place]   Lot: [id number]   [place]: [id number]

## 2019-11-09 ENCOUNTER — Telehealth: Payer: Self-pay | Admitting: *Deleted

## 2019-11-09 NOTE — Telephone Encounter (Signed)
Completed Nurtec PA on Cover My Meds. Key: [id number]. Awaiting determination from [place] [facility].

## 2019-11-15 NOTE — Telephone Encounter (Signed)
Message from Plan: Effective from 11/09/2019 through 01/31/2020.

## 2020-01-20 DIAGNOSIS — U071 COVID-19: Secondary | ICD-10-CM | POA: Diagnosis not present

## 2020-01-20 DIAGNOSIS — Z20822 Contact with and (suspected) exposure to covid-19: Secondary | ICD-10-CM | POA: Diagnosis not present

## 2020-01-20 DIAGNOSIS — Z0184 Encounter for antibody response examination: Secondary | ICD-10-CM | POA: Diagnosis not present

## 2020-01-26 DIAGNOSIS — R0602 Shortness of breath: Secondary | ICD-10-CM | POA: Diagnosis not present

## 2020-01-26 DIAGNOSIS — R05 Cough: Secondary | ICD-10-CM | POA: Diagnosis not present

## 2020-01-26 DIAGNOSIS — U071 COVID-19: Secondary | ICD-10-CM | POA: Diagnosis not present

## 2020-01-28 ENCOUNTER — Other Ambulatory Visit: Payer: Self-pay | Admitting: Gastroenterology

## 2020-01-31 ENCOUNTER — Other Ambulatory Visit: Payer: Self-pay | Admitting: Gastroenterology

## 2020-01-31 ENCOUNTER — Telehealth: Payer: Self-pay | Admitting: Gastroenterology

## 2020-01-31 NOTE — Telephone Encounter (Signed)
Patient needs to refill Linzess. She just made an appt to see Colleen on 03/07/20. Pt was not aware that she needed ov for further rfs. She stated hat she never checks mychart messages so she would prefer to use a different type of communication method. Please send rf to [facility] on [address].

## 2020-01-31 NOTE — Telephone Encounter (Signed)
30 days supply given

## 2020-02-02 ENCOUNTER — Other Ambulatory Visit: Payer: Self-pay | Admitting: Neurology

## 2020-02-02 NOTE — Telephone Encounter (Signed)
PA has been submitted with cover my meds. 

## 2020-02-05 NOTE — Telephone Encounter (Signed)
Approved  Ref number [id number] 02-02-2020 to 01-31-2021

## 2020-02-26 ENCOUNTER — Other Ambulatory Visit: Payer: Self-pay | Admitting: Gastroenterology

## 2020-03-02 ENCOUNTER — Other Ambulatory Visit: Payer: Self-pay | Admitting: Neurology

## 2020-03-02 DIAGNOSIS — G43709 Chronic migraine without aura, not intractable, without status migrainosus: Secondary | ICD-10-CM

## 2020-03-07 ENCOUNTER — Ambulatory Visit: Payer: BC Managed Care – PPO | Admitting: Nurse Practitioner

## 2020-03-07 ENCOUNTER — Encounter: Payer: Self-pay | Admitting: Nurse Practitioner

## 2020-03-07 ENCOUNTER — Other Ambulatory Visit (INDEPENDENT_AMBULATORY_CARE_PROVIDER_SITE_OTHER): Payer: BC Managed Care – PPO

## 2020-03-07 VITALS — BP 100/80 | HR 80 | Ht 67.25 in | Wt 164.5 lb

## 2020-03-07 DIAGNOSIS — K5900 Constipation, unspecified: Secondary | ICD-10-CM | POA: Diagnosis not present

## 2020-03-07 DIAGNOSIS — K625 Hemorrhage of anus and rectum: Secondary | ICD-10-CM

## 2020-03-07 DIAGNOSIS — Z8371 Family history of colonic polyps: Secondary | ICD-10-CM

## 2020-03-07 LAB — COMPREHENSIVE METABOLIC PANEL
ALT: 15 U/L (ref 0–35)
AST: 14 U/L (ref 0–37)
Albumin: 5 g/dL (ref 3.5–5.2)
Alkaline Phosphatase: 50 U/L (ref 39–117)
BUN: 26 mg/dL — ABNORMAL HIGH (ref 6–23)
CO2: 25 mEq/L (ref 19–32)
Calcium: 10.1 mg/dL (ref 8.4–10.5)
Chloride: 105 mEq/L (ref 96–112)
Creatinine, Ser: 0.92 mg/dL (ref 0.40–1.20)
GFR: 63.58 mL/min (ref >60.00)
Glucose, Bld: 91 mg/dL (ref 70–99)
Potassium: 3.9 mEq/L (ref 3.5–5.1)
Sodium: 138 mEq/L (ref 135–145)
Total Bilirubin: 0.5 mg/dL (ref 0.2–1.2)
Total Protein: 7.4 g/dL (ref 6.0–8.3)

## 2020-03-07 LAB — CBC WITH DIFFERENTIAL/PLATELET
Basophils Absolute: 0.1 10*3/uL (ref 0.0–0.1)
Basophils Relative: 2 % (ref 0.0–3.0)
Eosinophils Absolute: 0.2 10*3/uL (ref 0.0–0.7)
Eosinophils Relative: 4.2 % (ref 0.0–5.0)
HCT: 44.2 % (ref 36.0–46.0)
Hemoglobin: 14.9 g/dL (ref 12.0–15.0)
Lymphocytes Relative: 23 % (ref 12.0–46.0)
Lymphs Abs: 1.1 10*3/uL (ref 0.7–4.0)
MCHC: 33.6 g/dL (ref 30.0–36.0)
MCV: 97.9 fl (ref 78.0–100.0)
Monocytes Absolute: 0.4 10*3/uL (ref 0.1–1.0)
Monocytes Relative: 8.4 % (ref 3.0–12.0)
Neutro Abs: 3 10*3/uL (ref 1.4–7.7)
Neutrophils Relative %: 62.4 % (ref 43.0–77.0)
Platelets: 262 10*3/uL (ref 150.0–400.0)
RBC: 4.51 Mil/uL (ref 3.87–5.11)
RDW: 12.8 % (ref 11.5–15.5)
WBC: 4.8 10*3/uL (ref 4.0–10.5)

## 2020-03-07 LAB — C-REACTIVE PROTEIN: CRP: <1 mg/dL (ref 0.5–20.0)

## 2020-03-07 MED ORDER — SUPREP BOWEL PREP KIT 17.5-3.13-1.6 GM/177ML PO SOLN: 1.0000 | ORAL | 0 refills | Status: DC

## 2020-03-07 MED ORDER — LINACLOTIDE 145 MCG PO CAPS: 145.0000 ug | ORAL_CAPSULE | Freq: Every day | ORAL | 2 refills | Status: DC

## 2020-03-07 NOTE — Progress Notes (Signed)
____________________________________________________________  Attending physician addendum:  Thank you for sending this case to me. I have reviewed the entire note, and the outlined plan seems appropriate.  Keyna Blizard Danis, MD  ____________________________________________________________  

## 2020-03-07 NOTE — Patient Instructions (Addendum)
If you are age 54 or older, your body mass index should be between 23-30. Your Body mass index is 25.57 kg/m. If this is out of the aforementioned range listed, please consider follow up with your Primary Care Provider.  If you are age 78 or younger, your body mass index should be between 19-25. Your Body mass index is 25.57 kg/m. If this is out of the aformentioned range listed, please consider follow up with your Primary Care Provider.    We have sent the following medications to your pharmacy for you to pick up at your convenience: [place] [facility] [place] provider has requested that you go to the basement level for lab work before leaving today. Press "B" on the elevator. The lab is located at the first door on the left as you exit the elevator.   Due to recent changes in healthcare laws, you may see the results of your imaging and laboratory studies on MyChart before your provider has had a chance to review them.  We understand that in some cases there may be results that are confusing or concerning to you. Not all laboratory results come back in the same time frame and the provider may be waiting for multiple results in order to interpret others.  Please give [place] 48 hours in order for your provider to thoroughly review all the results before contacting the office for clarification of your results.   1. Continue Linzess 180mcg once daily.   2. START: IBgard - twice daily as needed to help with gas. ( over the counter) 2 boxes of samples given to you today to try.  Thank you for choosing me and [facility]. Cottie Banda Smith-CRNP

## 2020-03-07 NOTE — Progress Notes (Signed)
03/07/2020 [name] [id number] [birth year]   Chief Complaint: Refill Linzess   History of Present Illness:  [name] is a 54 year old female with a past medical history of anxiety, migraine headaches and constipation.  She received Pfizer Covid 19 vaccination in April 2021 then tested positive for symptomatic Covid 01/20/2020 for which she received Decadron injection. A chest xray was normal.   She is followed by Dr. Rachael Darby.  She presents to our office today for her annual review and to refill her Linzess prescription.  She has chronic constipation for which she takes Linzess 145 mcg 1 tab once or twice weekly before dinner.  She reports passing several formed to loose stools over 1 to 2 hours after she takes [place] which works well for her.  When she took the [place] in the morning she could not get out of the house due to passing numerous bowel movements over a few hours.  She reports seeing a small amount of bright red blood on the toilet tissue for several years.  She attributes her rectal bleeding to having hemorrhoids.  Her mother has a history of colon polyps and at one point she required frequent colonoscopies.  No family history of colorectal cancer.  She underwent a colonoscopy by Dr. Maurene Capes 10/18/2012 which showed small internal hemorrhoids otherwise was normal.  She complains of having excessive gas which is fairly well controlled by maintaining a gluten and dairy free diet.  She believes she was checked for celiac disease prior to going on a gluten-free diet which was negative.  She takes Gas-X as needed.  She avoids gas producing foods. She does not tolerate fiber supplements which resulted in more gas and bloat.  She is taking magnesium at bedtime for her constipation as well.  No family history of celiac disease of IBD. No weight loss.  No other complaints today.  Current Outpatient Medications on File Prior to Visit  Medication Sig Dispense Refill  . diclofenac  (VOLTAREN) 50 MG EC tablet TAKE 1 TABLET BY MOUTH AS NEEDED. 20 tablet 1  . Fremanezumab-vfrm (AJOVY) 225 MG/1.5ML SOAJ Inject 225 mg into the skin every 30 (thirty) days. 1 pen 11  . NURTEC 75 MG TBDP TAKE 1 TABLET DAILY AS NEEDED FOR MIGRAINES AS CLOSE TO ONSET OF MIGRAINE AS POSSIBLE 1 DAILY MAX 8 tablet 8  . OVER THE COUNTER MEDICATION Takes claritin or Zyrtec    . rizatriptan (MAXALT) 10 MG tablet May repeat in 2 hours if needed 18 tablet 11  . spironolactone (ALDACTONE) 25 MG tablet Take 25 mg by mouth daily.    [place] Kitchen topiramate (TOPAMAX) 50 MG tablet TAKE 1 TABLET AT BEDTIME FOR 1 WEEK, THEN INCREASE TO 2 TABLETS AT BEDTIME 90 tablet 4  . tretinoin (RETIN-A) 0.025 % cream as needed.   3  . methylPREDNISolone (MEDROL DOSEPAK) 4 MG TBPK tablet May take daily pills each morning with food. Take for 6 days. (Patient not taking: Reported on 03/07/2020) 21 tablet 1   No current facility-administered medications on file prior to visit.    Allergies  Allergen Reactions  . Tylox [Oxycodone-Acetaminophen] Itching  . Codeine     nausea     Current Medications, Allergies, Past Medical History, Past Surgical History, Family History and Social History were reviewed in [employer] record.   Review of Systems:   Constitutional: Negative for fever, sweats, chills or weight loss.  Respiratory: No current SOB.  Cardiovascular: Negative for  chest pain, palpitations and leg swelling.  Gastrointestinal: See HPI.  Musculoskeletal: Negative for joint pains or muscle aches.  Neurological: Negative for dizziness, headaches or paresthesias.    Physical Exam: BP 100/80 (BP Location: Left Arm, Patient Position: Sitting, Cuff Size: Normal)   Pulse 80   Ht 5' 7.25" (1.708 m) Comment: height measured without shoes  Wt 164 lb 8 oz (74.6 kg)   BMI 25.57 kg/m   General: Well developed 54 year old female in no acute distress. Head: Normocephalic and atraumatic. Eyes: No scleral icterus.  Conjunctiva pink . Ears: Normal auditory acuity. Mouth: Dentition intact. No ulcers or lesions.  Lungs: Clear throughout to auscultation. Heart: Regular rate and rhythm, no murmur. Abdomen: Soft, nondistended. Periumbilical tenderness without rebound or guarding. No masses or hepatomegaly. Normal bowel sounds x 4 quadrants.  Rectal: Deferred.  Musculoskeletal: Symmetrical with no gross deformities. Extremities: No edema. Neurological: Alert oriented x 4. No focal deficits.  Psychological: Alert and cooperative. Normal mood and affect  Assessment and Recommendations:  29.  54 year old female with chronic constipation and gas -Continue Linzess 145 mcg 1 tab to be taken 30 minutes before dinner as needed -CBC, CMP and CRP -I discussed scheduling a colonoscopy due to the patient's intermittent rectal bleeding and positive family history of colon polyps.  She has a history of internal hemorrhoids confirmed by a colonoscopy in 2014.  I explained to the patient a colonoscopy was recommended to rule out any other sources of bright red rectal bleeding such as a polyp/cancerous process or ulceration in the rectum/didtal colon. She consents to proceed with a colonoscopy. -Colonoscopy benefits and risks discussed including risk with sedation, risk of bleeding, perforation and infection  -IBgard 1 p.o. twice daily for abdominal gas PRN  2.  Intermittent rectal bleeding -Desitin PRN for hemorrhoidal irritation and rectal bleeding  -See plan in # 1  3. Family history of colon polyps (mother) -See plan in # 1

## 2020-03-18 ENCOUNTER — Telehealth: Payer: Self-pay | Admitting: Neurology

## 2020-03-18 ENCOUNTER — Other Ambulatory Visit: Payer: Self-pay | Admitting: Neurology

## 2020-03-18 DIAGNOSIS — G43709 Chronic migraine without aura, not intractable, without status migrainosus: Secondary | ICD-10-CM

## 2020-03-18 NOTE — Telephone Encounter (Signed)
Pt has called to report that a PA is needed on her NURTEC 75 MG TBDP  [id number]

## 2020-03-18 NOTE — Telephone Encounter (Signed)
Completed Nurtec PA on Cover My Meds. Key: [id number]. Awaiting determination from [place].

## 2020-03-20 NOTE — Telephone Encounter (Signed)
Nurtec savings card left at front desk.

## 2020-04-02 DIAGNOSIS — R269 Unspecified abnormalities of gait and mobility: Secondary | ICD-10-CM | POA: Diagnosis not present

## 2020-04-02 DIAGNOSIS — M79605 Pain in left leg: Secondary | ICD-10-CM | POA: Diagnosis not present

## 2020-04-04 ENCOUNTER — Other Ambulatory Visit: Payer: Self-pay | Admitting: Neurology

## 2020-04-04 MED ORDER — UBRELVY 100 MG PO TABS: 100.0000 mg | ORAL_TABLET | ORAL | 3 refills | Status: DC | PRN

## 2020-04-04 NOTE — Telephone Encounter (Signed)
We received a denial letter for Nurtec from [place]. Insurance requires the pt try Roselyn Meier first. If an appeal is desired, patient has to give written permission (form on [employer] website search for "member appeal representation". Appeal should be faxed within 180 days to [phone].   The pt should be able to use the savings card each month until card expires. [facility] and was advised it didn't appear that a Nurtec savings card had been used yet on pt's file and they asked for the pt to bring one in to try. I called the pt and discussed. She said she had the pharmacy enter in the [place], etc from her card and they told her the card could only be used once. Pt states if she cannot get the Nurtec she is willing to try Ubrelvy. We have other Nurtec cards here at the office. [facility] and they applied a new card to the pt's file. That card was unable to be used either and they received a message saying only 2 uses per card. Pt had used a savings card at least once in the past in March.  I called pt back and LVM (ok per DPR) advising the card could not be used and I would be in touch with Dr Jaynee Eagles about an alternative medication, Roselyn Meier.

## 2020-04-04 NOTE — Addendum Note (Signed)
Addended by: Gildardo Griffes on: 04/04/2020 01:18 PM   Modules accepted: Orders

## 2020-04-04 NOTE — Telephone Encounter (Signed)
Please do! 100mg  q2h max 200mg  a day. 3 refills. thanks

## 2020-04-04 NOTE — Telephone Encounter (Signed)
Pt called, did not pick up coupon at your office. Printed coupon online for Automatic Data 75 MG TBDP and the pharmacy said I could only use a coupon one time. Is this going to be a one time use? Will I be able to use the coupon or flagged as a one time user? Would like a call from the nurse.

## 2020-04-04 NOTE — Telephone Encounter (Signed)
Completed Roselyn Meier PA on Cover My Meds. Key: [id number]. Favorable outcome determined already but no approval dates have been given. Messaged pt with information in mychart.

## 2020-04-04 NOTE — Telephone Encounter (Signed)
Ubrelvy ordered. Will work on [place].

## 2020-04-10 NOTE — Telephone Encounter (Signed)
Spoke with pharmacist who confirmed they have a paid claim for Ubrelvy.

## 2020-04-12 DIAGNOSIS — R269 Unspecified abnormalities of gait and mobility: Secondary | ICD-10-CM | POA: Diagnosis not present

## 2020-04-12 DIAGNOSIS — M79605 Pain in left leg: Secondary | ICD-10-CM | POA: Diagnosis not present

## 2020-04-25 ENCOUNTER — Other Ambulatory Visit: Payer: Self-pay | Admitting: Neurology

## 2020-04-30 ENCOUNTER — Encounter: Payer: BC Managed Care – PPO | Admitting: Gastroenterology

## 2020-05-01 DIAGNOSIS — J019 Acute sinusitis, unspecified: Secondary | ICD-10-CM | POA: Diagnosis not present

## 2020-05-01 DIAGNOSIS — J029 Acute pharyngitis, unspecified: Secondary | ICD-10-CM | POA: Diagnosis not present

## 2020-05-13 DIAGNOSIS — Z1231 Encounter for screening mammogram for malignant neoplasm of breast: Secondary | ICD-10-CM | POA: Diagnosis not present

## 2020-05-13 DIAGNOSIS — Z01419 Encounter for gynecological examination (general) (routine) without abnormal findings: Secondary | ICD-10-CM | POA: Diagnosis not present

## 2020-05-13 DIAGNOSIS — Z6826 Body mass index (BMI) 26.0-26.9, adult: Secondary | ICD-10-CM | POA: Diagnosis not present

## 2020-06-12 ENCOUNTER — Other Ambulatory Visit: Payer: Self-pay | Admitting: Neurology

## 2020-06-17 DIAGNOSIS — Z13228 Encounter for screening for other metabolic disorders: Secondary | ICD-10-CM | POA: Diagnosis not present

## 2020-06-17 DIAGNOSIS — N941 Unspecified dyspareunia: Secondary | ICD-10-CM | POA: Diagnosis not present

## 2020-06-17 DIAGNOSIS — Z1321 Encounter for screening for nutritional disorder: Secondary | ICD-10-CM | POA: Diagnosis not present

## 2020-06-17 DIAGNOSIS — Z13220 Encounter for screening for lipoid disorders: Secondary | ICD-10-CM | POA: Diagnosis not present

## 2020-06-17 DIAGNOSIS — Z1329 Encounter for screening for other suspected endocrine disorder: Secondary | ICD-10-CM | POA: Diagnosis not present

## 2020-06-17 NOTE — Telephone Encounter (Signed)
Renewal PA for Ubrelvy completed on Cover My Meds. Key: [id number]. Awaiting determination from [place].

## 2020-06-18 NOTE — Telephone Encounter (Signed)
Received approval letter for Ubrelvy 100 mg from [place]. Approved 06/17/20-06/16/2021. I faxed the approval to the pt's pharmacy. Received a receipt of confirmation.

## 2020-07-08 ENCOUNTER — Ambulatory Visit: Payer: BC Managed Care – PPO | Admitting: Gastroenterology

## 2020-07-08 ENCOUNTER — Encounter: Payer: Self-pay | Admitting: Gastroenterology

## 2020-07-08 VITALS — BP 138/80 | HR 88 | Ht 67.25 in | Wt 171.0 lb

## 2020-07-08 DIAGNOSIS — R6881 Early satiety: Secondary | ICD-10-CM

## 2020-07-08 DIAGNOSIS — K625 Hemorrhage of anus and rectum: Secondary | ICD-10-CM

## 2020-07-08 DIAGNOSIS — R1013 Epigastric pain: Secondary | ICD-10-CM

## 2020-07-08 DIAGNOSIS — R110 Nausea: Secondary | ICD-10-CM

## 2020-07-08 DIAGNOSIS — K5909 Other constipation: Secondary | ICD-10-CM

## 2020-07-08 DIAGNOSIS — R140 Abdominal distension (gaseous): Secondary | ICD-10-CM

## 2020-07-08 MED ORDER — PLENVU 140 G PO SOLR: 140.0000 g | ORAL | 0 refills | Status: DC

## 2020-07-08 NOTE — Progress Notes (Signed)
GI Progress Note  Chief Complaint: Chronic constipation  Subjective  History: Chronic idiopathic constipation with bloating, previously seen by Dr. Olevia Perches and then by me, most recently seen in clinic here several months ago by our NP. Linzess increases frequency of BMs, though sometimes with loose stool.  IBgard was added at the most recent visit, and she was also advised to have a colonoscopy for intermittent rectal bleeding (which the patient felt was most likely hemorrhoidal in nature based on previous colonoscopy report in 2014).  Also history of endometriosis and lysis of adhesions.  Ceira decided to wait on the procedure until after the holidays.  Constipation and bloating are stable issue for her.  She takes the Linzess 2-3 evenings a week because she knows will cause loose stool shortly afterwards, and that is why she cannot take it in the morning.  There is intermittent painless rectal bleeding. Since about last July she has had feelings of food "stuck" in the subxiphoid area with early satiety and intermittent nausea.  She has dyspepsia sometimes just drinking water, and has been taking Pepcid or Tums without much relief.  There is occasional regurgitation or heartburn.  ROS: Cardiovascular:  no chest pain Respiratory: no dyspnea Remainder of systems negative except as above The patient's Past Medical, Family and Social History were reviewed and are on file in the EMR.  Objective:  Med list reviewed  Current Outpatient Medications:  .  diclofenac (VOLTAREN) 50 MG EC tablet, TAKE 1 TABLET BY MOUTH AS NEEDED., Disp: 20 tablet, Rfl: 1 .  Fremanezumab-vfrm (AJOVY) 225 MG/1.5ML SOAJ, Inject 225 mg into the skin every 30 (thirty) days., Disp: 1 pen, Rfl: 11 .  linaclotide (LINZESS) 145 MCG CAPS capsule, Take 1 capsule (145 mcg total) by mouth daily before breakfast., Disp: 30 capsule, Rfl: 2 .  Na Sulfate-K Sulfate-Mg Sulf (SUPREP BOWEL PREP KIT) 17.5-3.13-1.6  GM/177ML SOLN, Take 1 kit by mouth as directed. For colonoscopy prep, Disp: 354 mL, Rfl: 0 .  OVER THE COUNTER MEDICATION, Takes claritin or Zyrtec, Disp: , Rfl:  .  rizatriptan (MAXALT) 10 MG tablet, May repeat in 2 hours if needed, Disp: 18 tablet, Rfl: 11 .  spironolactone (ALDACTONE) 25 MG tablet, Take 25 mg by mouth daily., Disp: , Rfl:  .  topiramate (TOPAMAX) 50 MG tablet, TAKE 1 TABLET AT BEDTIME FOR 1 WEEK, THEN INCREASE TO 2 TABLETS AT BEDTIME, Disp: 180 tablet, Rfl: 1 .  tretinoin (RETIN-A) 0.025 % cream, as needed. , Disp: , Rfl: 3 .  Ubrogepant (UBRELVY) 100 MG TABS, Take 100 mg by mouth every 2 (two) hours as needed (for migraine. take as close to onset as possible. Max 200 mg per day.)., Disp: 10 tablet, Rfl: 3 .  methylPREDNISolone (MEDROL DOSEPAK) 4 MG TBPK tablet, May take daily pills each morning with food. Take for 6 days. (Patient not taking: No sig reported), Disp: 21 tablet, Rfl: 1   Vital signs in last 24 hrs: Vitals:   07/08/20 1405  BP: 138/80  Pulse: 88  SpO2: 98%   Wt Readings from Last 3 Encounters:  07/08/20 171 lb (77.6 kg)  03/07/20 164 lb 8 oz (74.6 kg)  09/13/19 161 lb (73 kg)    Physical Exam  Well-appearing  HEENT: sclera anicteric, oral mucosa moist without lesions  Neck: supple, no thyromegaly, JVD or lymphadenopathy  Cardiac: RRR without murmurs, S1S2 heard, no peripheral edema  Pulm: clear to auscultation bilaterally, normal RR and effort noted  Abdomen:  soft, no tenderness, with active bowel sounds. No guarding or palpable hepatosplenomegaly.  Skin; warm and dry, no jaundice or rash  Labs:   ___________________________________________ Radiologic studies:   ____________________________________________ Other:   _____________________________________________ Assessment & Plan  Assessment: Encounter Diagnoses  Name Primary?  . Chronic constipation Yes  . Abdominal bloating   . Rectal bleeding   . Epigastric pain   . Nausea  in adult   . Early satiety    Chronic stable lower GI symptoms.  The bleeding is probably hemorrhoidal, but I also advised a colonoscopy to be sure there are no other causes, especially since last colonoscopy was in 2014.  Upper GI symptoms for at least 6 months, nature of symptoms seem to overlap between mechanical and motility issues.   Plan: EGD and colonoscopy.  She was agreeable after discussion of procedure and risks  The benefits and risks of the planned procedure were described in detail with the patient or (when appropriate) their health care proxy.  Risks were outlined as including, but not limited to, bleeding, infection, perforation, adverse medication reaction leading to cardiac or pulmonary decompensation, pancreatitis (if ERCP).  The limitation of incomplete mucosal visualization was also discussed.  No guarantees or warranties were given.   2-week trial of Motegrity 2 mg every afternoon, samples were given to use instead of Linzess for short period of time.  I would like to see if it is more effective and/or better tolerated than the Linzess.  30 minutes were spent on this encounter (including chart review, history/exam, counseling/coordination of care, and documentation) > 50% of that time was spent on counseling and coordination of care.  Topics discussed included: Chronic constipation, bloating, dietary triggers, endoscopic test.  [name]

## 2020-07-08 NOTE — Patient Instructions (Addendum)
If you are age 55 or older, your body mass index should be between 23-30. Your Body mass index is 26.58 kg/m. If this is out of the aforementioned range listed, please consider follow up with your Primary Care Provider.  If you are age 33 or younger, your body mass index should be between 19-25. Your Body mass index is 26.58 kg/m. If this is out of the aformentioned range listed, please consider follow up with your Primary Care Provider.   You have been scheduled for an endoscopy and colonoscopy. Please follow the written instructions given to you at your visit today. Please pick up your prep supplies at the pharmacy within the next 1-3 days. If you use inhalers (even only as needed), please bring them with you on the day of your procedure.  Trial of Motegrity 2 mg for 2 weeks. Do not use Linzess while taking the Motegrity   Medication Samples have been provided to the patient.  Drug name: Motegrity       Strength: 2mg          Qty: 14  LOT: [id number]  Exp.Date: 11-2021  Dosing instructions: one by mouth daily   The patient has been instructed regarding the correct time, dose, and frequency of taking this medication, including desired effects and most common side effects.    It was a pleasure to see you today!  Dr. Loletha Carrow

## 2020-07-10 DIAGNOSIS — L700 Acne vulgaris: Secondary | ICD-10-CM | POA: Diagnosis not present

## 2020-07-10 DIAGNOSIS — Z79899 Other long term (current) drug therapy: Secondary | ICD-10-CM | POA: Diagnosis not present

## 2020-07-22 DIAGNOSIS — Z79899 Other long term (current) drug therapy: Secondary | ICD-10-CM | POA: Diagnosis not present

## 2020-07-22 DIAGNOSIS — Z0000 Encounter for general adult medical examination without abnormal findings: Secondary | ICD-10-CM | POA: Diagnosis not present

## 2020-07-29 DIAGNOSIS — Z0000 Encounter for general adult medical examination without abnormal findings: Secondary | ICD-10-CM | POA: Diagnosis not present

## 2020-07-29 DIAGNOSIS — F321 Major depressive disorder, single episode, moderate: Secondary | ICD-10-CM | POA: Diagnosis not present

## 2020-07-31 DIAGNOSIS — M79602 Pain in left arm: Secondary | ICD-10-CM | POA: Diagnosis not present

## 2020-07-31 DIAGNOSIS — M79601 Pain in right arm: Secondary | ICD-10-CM | POA: Diagnosis not present

## 2020-07-31 DIAGNOSIS — R739 Hyperglycemia, unspecified: Secondary | ICD-10-CM | POA: Diagnosis not present

## 2020-08-06 DIAGNOSIS — M79602 Pain in left arm: Secondary | ICD-10-CM | POA: Diagnosis not present

## 2020-08-06 DIAGNOSIS — M79601 Pain in right arm: Secondary | ICD-10-CM | POA: Diagnosis not present

## 2020-08-14 DIAGNOSIS — M79601 Pain in right arm: Secondary | ICD-10-CM | POA: Diagnosis not present

## 2020-08-14 DIAGNOSIS — M79602 Pain in left arm: Secondary | ICD-10-CM | POA: Diagnosis not present

## 2020-08-15 DIAGNOSIS — G43709 Chronic migraine without aura, not intractable, without status migrainosus: Secondary | ICD-10-CM

## 2020-08-15 MED ORDER — NURTEC 75 MG PO TBDP: 75.0000 mg | ORAL_TABLET | Freq: Every day | ORAL | 1 refills | Status: DC | PRN

## 2020-08-20 ENCOUNTER — Encounter: Payer: Self-pay | Admitting: Neurology

## 2020-08-20 ENCOUNTER — Ambulatory Visit: Payer: BC Managed Care – PPO | Admitting: Neurology

## 2020-08-20 VITALS — BP 117/83 | HR 88 | Ht 67.5 in | Wt 161.0 lb

## 2020-08-20 DIAGNOSIS — G43709 Chronic migraine without aura, not intractable, without status migrainosus: Secondary | ICD-10-CM | POA: Diagnosis not present

## 2020-08-20 MED ORDER — TOPIRAMATE 100 MG PO TABS: 100.0000 mg | ORAL_TABLET | Freq: Every evening | ORAL | 4 refills | Status: DC

## 2020-08-20 MED ORDER — RIZATRIPTAN BENZOATE 10 MG PO TABS: ORAL_TABLET | ORAL | 11 refills | Status: DC

## 2020-08-20 MED ORDER — NURTEC 75 MG PO TBDP: 75.0000 mg | ORAL_TABLET | ORAL | 11 refills | Status: DC

## 2020-08-20 NOTE — Progress Notes (Signed)
[facility]    Provider:  Dr Jaynee Eagles Referring Provider: Osborne Casco Fransico Him, MD Primary Care Physician:  Haywood Pao, MD  CC:  Migraine   She is doing well on current therapy. She does not like the [place]. She prefers Nurtec, we will see if we can get that approved for her, keep her on the topiramate. Also maxalt acutely. We discussed strategies for migraine acute management. She has 10 migraines a month, I offered to try and reduce that with other management or possibly trying ajovy again (she didn't think it helped). But she is happy with this freq and feels she can treat the migraines when they happen.  Interval history 09/13/2019: She is smelling strong cigarette smoke, happens often, no hx of seizures, she has sinus problems and I recommended starting with ENT, it is associated with masks and adding moisture in the nose helps and she uses saline spray. We discussed Ajovy again. We discussed Ajovy, nurtec. Discussed Frovatriptan and long-acting triptans. I can't refill her xanax, we had a talk about that.    Interval history 02/21/2019: Here for follow up of migraines. She tried the [place] but she had constipation and could not tolerate. She saw Dr. [name] in the past and has been on multiple meds in the past. Worsening migraines. She is having most of the month if headaches and 10 days a month of migraines. She only took the [place]. Recommended Ajovy. Discussed oral medication.   HPI 11/11/2018:  [name] is a 55 y.o. female here as a referral from Dr. Osborne Casco for migraine. PMHX migraines, irritable bowel symptom, endometriosis, anxiety, anemia.  Patient is transitioning from 1 of my colleagues to me for migraine management.  Patient was previously seen by Dr. love.  Patient has a history of migraines for over 20 years.  She has an extensive family history of migraines.  She has been on multiple medications including Topamax up to 200 mg a day.  Her headaches are  normally located in the right side of the head, throbbing and pulsating, she has osmophobia and smells can trigger, she endorses nausea, so pounding that she can't move, can be severe, also light sensitivity, no aura. 20 headache days a month, more than 10 are migrainous, can be severe and can last > 24 hours without treatment. No medication overuse.   Medications tried include: Gabapentin, topiramate up to 200 mg daily, steroids, Cymbalta, diclofenac, Zofran, Mobic, blood pressure medications Spironolactone, tizanidine  Reviewed notes, labs and imaging from outside physicians, which showed:  Reviewed EMG study, data and results which was normal no electrodiagnostic evidence of large fiber peripheral neuropathy or right lumbosacral radiculopathy.  MRI of the brain images which I reviewed was normal November 2007.  Reviewed prior neurology notes: Headaches began in childhood since the age of 67.  Neuro is in the left side, and related to her menstrual cycle, and not associated with disturbances of nausea or vomiting or visual but does have a history of endometriosis.  Headaches last intermittently for 5 days and are improved with ketoprofen, Maxalt therapy and coffee.  Her daughter has been to the headache clinic and uses Biofreeze to the scalp as well as Maxalt.  Extensive family history of the patient's daughter, son, grandmother and mother have migraines.  Her migraines are throbbing, she has smell sensitivity and triggers and an ice pick quality.  She has been on multiple packs of steroids.  She has chronic neck pain.  IV Depacon have worked  acutely for her migraines in the past  Review of Systems: Patient complains of symptoms per HPI as well as the following symptoms: headache . Pertinent negatives and positives per HPI. All others negative    Social History   Socioeconomic History  . Marital status: Married    Spouse name: [name]  . Number of children: 2  . Years of education: Not on file   . Highest education level: Associate degree: academic program  Occupational History  . Occupation: stay at home  Tobacco Use  . Smoking status: Former Research scientist (life sciences)  . Smokeless tobacco: Never Used  Vaping Use  . Vaping Use: Never used  Substance and Sexual Activity  . Alcohol use: No    Alcohol/week: 0.0 standard drinks  . Drug use: No  . Sexual activity: Yes    Birth control/protection: Surgical  Other Topics Concern  . Not on file  Social History Narrative   Patient lives at home with her husband [name]. Drinks two cups of caffeine daily. Patient has two years of college education. Right handed.   Social Determinants of Health   Financial Resource Strain: Not on file  Food Insecurity: Not on file  Transportation Needs: Not on file  Physical Activity: Not on file  Stress: Not on file  Social Connections: Not on file  Intimate Partner Violence: Not on file    Family History  Problem Relation Age of Onset  . Diabetes Mother        both sides grandparents  . Hypertension Mother   . Colon polyps Mother   . Thyroid disease Father   . Colon polyps Father   . Colon cancer Neg Hx   . Stomach cancer Neg Hx   . Esophageal cancer Neg Hx     Past Medical History:  Diagnosis Date  . Anemia   . Anxiety   . Brachial neuritis or radiculitis NOS   . COVID-19 virus infection   . Endometriosis   . H/O: hysterectomy   . IBS (irritable bowel syndrome)   . Migraine, unspecified, without mention of intractable migraine without mention of status migrainosus   . Migraines   . Neuropathy   . Shoulder pain     Past Surgical History:  Procedure Laterality Date  . ABDOMINAL HYSTERECTOMY    . CHOLECYSTECTOMY    . CYSTOSCOPY  11/17/2011   Procedure: CYSTOSCOPY;  Surgeon: Darlyn Chamber, MD;  Location: [facility];  Service: Gynecology;;  . DIAGNOSTIC LAPAROSCOPY    . LAPAROSCOPY  11/17/2011   Procedure: LAPAROSCOPY OPERATIVE;  Surgeon: Darlyn Chamber, MD;  Location: [facility];  Service:  Gynecology;  Laterality: N/A;  with Injection of Vaginal Cuff with Dexamethasone mixed in 0.5% Bupivacaine  . NASAL SEPTUM SURGERY     bone spur    Current Outpatient Medications  Medication Sig Dispense Refill  . linaclotide (LINZESS) 145 MCG CAPS capsule Take 1 capsule (145 mcg total) by mouth daily before breakfast. 30 capsule 2  . methylPREDNISolone (MEDROL DOSEPAK) 4 MG TBPK tablet May take daily pills each morning with food. Take for 6 days. 21 tablet 1  . Na Sulfate-K Sulfate-Mg Sulf (SUPREP BOWEL PREP KIT) 17.5-3.13-1.6 GM/177ML SOLN Take 1 kit by mouth as directed. For colonoscopy prep 354 mL 0  . OVER THE COUNTER MEDICATION Takes claritin or Zyrtec    . PEG-KCl-NaCl-NaSulf-Na Asc-C (PLENVU) 140 g SOLR Take 140 g by mouth as directed. Manufacturer's coupon Universal coupon code:BIN: P2366821; GROUP: [id number]; PCN: [id number]; ID: [id number]; PAY NO MORE $  50 1 each 0  . Rimegepant Sulfate (NURTEC) 75 MG TBDP Take 75 mg by mouth every other day. 16 tablet 11  . spironolactone (ALDACTONE) 25 MG tablet Take 25 mg by mouth daily.    Marland Kitchen tretinoin (RETIN-A) 0.025 % cream as needed.   3  . rizatriptan (MAXALT) 10 MG tablet May repeat in 2 hours if needed 18 tablet 11  . topiramate (TOPAMAX) 100 MG tablet Take 1 tablet (100 mg total) by mouth at bedtime. 90 tablet 4   No current facility-administered medications for this visit.    Allergies as of 08/20/2020 - Review Complete 08/20/2020  Allergen Reaction Noted  . Tylox [oxycodone-acetaminophen] Itching 11/20/2011  . Codeine  08/05/2011    Vitals: BP 117/83 (BP Location: Right Arm, Patient Position: Sitting)   Pulse 88   Ht 5' 7.5" (1.715 m)   Wt 161 lb (73 kg)   BMI 24.84 kg/m  Last Weight:  Wt Readings from Last 1 Encounters:  08/20/20 161 lb (73 kg)   Last Height:   Ht Readings from Last 1 Encounters:  08/20/20 5' 7.5" (1.715 m)   Exam: NAD, pleasant                  Speech:    Speech is normal; fluent and spontaneous with  normal comprehension.  Cognition:    The patient is oriented to person, place, and time;     recent and remote memory intact;     language fluent;    Cranial Nerves:    The pupils are equal, round, and reactive to light.Trigeminal sensation is intact and the muscles of mastication are normal. The face is symmetric. The palate elevates in the midline. Hearing intact. Voice is normal. Shoulder shrug is normal. The tongue has normal motion without fasciculations.   Coordination:  No dysmetria  Motor Observation:    No asymmetry, no atrophy, and no involuntary movements noted. Tone:    Normal muscle tone.     Strength:    Strength is V/V in the upper and lower limbs.      Sensation: intact to LT    Assessment/Plan:  55 year old female with chronic intractable migraines. Failed multiple classes of medications still with considerable burden of migraines.  Recommend Botox for migraine she declines at this time, tried Ajovy in the past Topamax, Nurtec for acute management: continue Gave her more maxalt, discussed medication overuse headache, use no more than 10 days a month Refill maxalt, stop diclofenac(she has ulcer).   Meds ordered this encounter  Medications  . Rimegepant Sulfate (NURTEC) 75 MG TBDP    Sig: Take 75 mg by mouth every other day.    Dispense:  16 tablet    Refill:  11  . rizatriptan (MAXALT) 10 MG tablet    Sig: May repeat in 2 hours if needed    Dispense:  18 tablet    Refill:  11    May only fill once per month.  . topiramate (TOPAMAX) 100 MG tablet    Sig: Take 1 tablet (100 mg total) by mouth at bedtime.    Dispense:  90 tablet    Refill:  4     Discussed: To prevent or relieve headaches, try the following: Cool Compress. Lie down and place a cool compress on your head.  Avoid headache triggers. If certain foods or odors seem to have triggered your migraines in the past, avoid them. A headache diary might help you identify triggers.  Include physical  activity in your daily routine. Try a daily walk or other moderate aerobic exercise.  Manage stress. Find healthy ways to cope with the stressors, such as delegating tasks on your to-do list.  Practice relaxation techniques. Try deep breathing, yoga, massage and visualization.  Eat regularly. Eating regularly scheduled meals and maintaining a healthy diet might help prevent headaches. Also, drink plenty of fluids.  Follow a regular sleep schedule. Sleep deprivation might contribute to headaches Consider biofeedback. With this mind-body technique, you learn to control certain bodily functions -- such as muscle tension, heart rate and blood pressure -- to prevent headaches or reduce headache pain.    Proceed to emergency room if you experience new or worsening symptoms or symptoms do not resolve, if you have new neurologic symptoms or if headache is severe, or for any concerning symptom.   Provided education and documentation from American headache Society toolbox including articles on: chronic migraine medication overuse headache, chronic migraines, prevention of migraines, behavioral and other nonpharmacologic treatments for headache.    Sarina Ill, MD  [facility] [address] [place], [place] [address]  Phone [phone] Fax [phone] I spent 20 minutes of face-to-face and non-face-to-face time with patient on the  1. Chronic migraine without aura without status migrainosus, not intractable    diagnosis.  This included previsit chart review, lab review, study review, order entry, electronic health record documentation, patient education on the different diagnostic and therapeutic options, counseling and coordination of care, risks and benefits of management, compliance, or risk factor reduction

## 2020-08-22 ENCOUNTER — Encounter: Payer: Self-pay | Admitting: Gastroenterology

## 2020-08-22 ENCOUNTER — Telehealth: Payer: Self-pay | Admitting: *Deleted

## 2020-08-22 DIAGNOSIS — M79602 Pain in left arm: Secondary | ICD-10-CM | POA: Diagnosis not present

## 2020-08-22 DIAGNOSIS — M79601 Pain in right arm: Secondary | ICD-10-CM | POA: Diagnosis not present

## 2020-08-22 NOTE — Telephone Encounter (Signed)
Received Nurtec PA form from [place] to complete. PA completed, signed, and faxed back to [facility] and will be submitted to insurance on our behalf. Pt is to get 16 tablets of Nurtec monthly while we await the PA process.

## 2020-08-26 ENCOUNTER — Other Ambulatory Visit: Payer: Self-pay | Admitting: Neurology

## 2020-08-26 MED ORDER — NURTEC 75 MG PO TBDP: 75.0000 mg | ORAL_TABLET | Freq: Every day | ORAL | 6 refills | Status: DC | PRN

## 2020-08-26 NOTE — Telephone Encounter (Signed)
[name] @ [employer] called to report the PA denied on Nurtec  Non formulary medication.  [name] stated all alternatives  Have not been tried.  A fax has been sent with this information a call back # if needed is [phone] option 3 then option 1

## 2020-08-28 NOTE — Telephone Encounter (Addendum)
We received the denial for Nurtec ODT.  The denial letter states patient has to have tried Aimovig and [employer].  Spoke with Dr. Jaynee Eagles and she said we could switch the Nurtec prescription to acute treatment.  This was sent by Dr. Jaynee Eagles on 08/26/2020.  Patient should be able to get a courtesy fill of Nurtec by the mail order pharmacy while PAs are in process.

## 2020-08-30 ENCOUNTER — Other Ambulatory Visit: Payer: Self-pay

## 2020-08-30 ENCOUNTER — Ambulatory Visit (AMBULATORY_SURGERY_CENTER): Payer: BC Managed Care – PPO | Admitting: Gastroenterology

## 2020-08-30 ENCOUNTER — Encounter: Payer: Self-pay | Admitting: Gastroenterology

## 2020-08-30 VITALS — BP 115/76 | HR 62 | Temp 96.6°F | Resp 14 | Ht 67.2 in | Wt 171.0 lb

## 2020-08-30 DIAGNOSIS — D122 Benign neoplasm of ascending colon: Secondary | ICD-10-CM

## 2020-08-30 DIAGNOSIS — K2970 Gastritis, unspecified, without bleeding: Secondary | ICD-10-CM

## 2020-08-30 DIAGNOSIS — R1013 Epigastric pain: Secondary | ICD-10-CM

## 2020-08-30 DIAGNOSIS — D123 Benign neoplasm of transverse colon: Secondary | ICD-10-CM

## 2020-08-30 DIAGNOSIS — K2289 Other specified disease of esophagus: Secondary | ICD-10-CM | POA: Diagnosis not present

## 2020-08-30 DIAGNOSIS — K449 Diaphragmatic hernia without obstruction or gangrene: Secondary | ICD-10-CM

## 2020-08-30 DIAGNOSIS — K635 Polyp of colon: Secondary | ICD-10-CM | POA: Diagnosis not present

## 2020-08-30 DIAGNOSIS — K648 Other hemorrhoids: Secondary | ICD-10-CM

## 2020-08-30 DIAGNOSIS — K3189 Other diseases of stomach and duodenum: Secondary | ICD-10-CM | POA: Diagnosis not present

## 2020-08-30 DIAGNOSIS — K5909 Other constipation: Secondary | ICD-10-CM

## 2020-08-30 DIAGNOSIS — R1319 Other dysphagia: Secondary | ICD-10-CM

## 2020-08-30 DIAGNOSIS — K625 Hemorrhage of anus and rectum: Secondary | ICD-10-CM | POA: Diagnosis not present

## 2020-08-30 DIAGNOSIS — K209 Esophagitis, unspecified without bleeding: Secondary | ICD-10-CM | POA: Diagnosis not present

## 2020-08-30 MED ORDER — SODIUM CHLORIDE 0.9 % IV SOLN: 500.0000 mL | Freq: Once | INTRAVENOUS | Status: DC

## 2020-08-30 NOTE — Progress Notes (Signed)
Medical history reviewed with no changes noted. VS assessed by C.W 

## 2020-08-30 NOTE — Patient Instructions (Signed)
Handouts on polyps, hemorrhoids, Hemoridal banding, gastritis, and hiatal hernia given to you today  Await pathology results    YOU HAD AN ENDOSCOPIC PROCEDURE TODAY AT [facility]:   Refer to the procedure report that was given to you for any specific questions about what was found during the examination.  If the procedure report does not answer your questions, please call your gastroenterologist to clarify.  If you requested that your care partner not be given the details of your procedure findings, then the procedure report has been included in a sealed envelope for you to review at your convenience later.  YOU SHOULD EXPECT: Some feelings of bloating in the abdomen. Passage of more gas than usual.  Walking can help get rid of the air that was put into your GI tract during the procedure and reduce the bloating. If you had a lower endoscopy (such as a colonoscopy or flexible sigmoidoscopy) you may notice spotting of blood in your stool or on the toilet paper. If you underwent a bowel prep for your procedure, you may not have a normal bowel movement for a few days.  Please Note:  You might notice some irritation and congestion in your nose or some drainage.  This is from the oxygen used during your procedure.  There is no need for concern and it should clear up in a day or so.  SYMPTOMS TO REPORT IMMEDIATELY:   Following lower endoscopy (colonoscopy or flexible sigmoidoscopy):  Excessive amounts of blood in the stool  Significant tenderness or worsening of abdominal pains  Swelling of the abdomen that is new, acute  Fever of 100F or higher   Following upper endoscopy (EGD)  Vomiting of blood or coffee ground material  New chest pain or pain under the shoulder blades  Painful or persistently difficult swallowing  New shortness of breath  Fever of 100F or higher  Black, tarry-looking stools  For urgent or emergent issues, a gastroenterologist can be reached at any hour  by calling [phone]. Do not use MyChart messaging for urgent concerns.    DIET:  We do recommend a small meal at first, but then you may proceed to your regular diet.  Drink plenty of fluids but you should avoid alcoholic beverages for 24 hours.  ACTIVITY:  You should plan to take it easy for the rest of today and you should NOT DRIVE or use heavy machinery until tomorrow (because of the sedation medicines used during the test).    FOLLOW UP: Our staff will call the number listed on your records 48-72 hours following your procedure to check on you and address any questions or concerns that you may have regarding the information given to you following your procedure. If we do not reach you, we will leave a message.  We will attempt to reach you two times.  During this call, we will ask if you have developed any symptoms of COVID 19. If you develop any symptoms (ie: fever, flu-like symptoms, shortness of breath, cough etc.) before then, please call [phone].  If you test positive for Covid 19 in the 2 weeks post procedure, please call and report this information to [place].    If any biopsies were taken you will be contacted by phone or by letter within the next 1-3 weeks.  Please call us at [phone] if you have not heard about the biopsies in 3 weeks.    SIGNATURES/CONFIDENTIALITY: You and/or your care partner have signed paperwork which  will be entered into your electronic medical record.  These signatures attest to the fact that that the information above on your After Visit Summary has been reviewed and is understood.  Full responsibility of the confidentiality of this discharge information lies with you and/or your care-partner.

## 2020-08-30 NOTE — Op Note (Signed)
[facility] Patient Name: [name] Procedure Date: 08/30/2020 11:16 AM MRN: [id number] Endoscopist: Mallie Mussel L. Loletha Carrow , MD Age: 55 Referring MD:  Date of Birth: [birth year] Gender: Female Account #: [id number] Procedure:                Upper GI endoscopy Indications:              Esophageal dysphagia Medicines:                Monitored Anesthesia Care Procedure:                Pre-Anesthesia Assessment:                           - Prior to the procedure, a History and Physical                            was performed, and patient medications and                            allergies were reviewed. The patient's tolerance of                            previous anesthesia was also reviewed. The risks                            and benefits of the procedure and the sedation                            options and risks were discussed with the patient.                            All questions were answered, and informed consent                            was obtained. Prior Anticoagulants: The patient has                            taken no previous anticoagulant or antiplatelet                            agents. ASA Grade Assessment: II - A patient with                            mild systemic disease. After reviewing the risks                            and benefits, the patient was deemed in                            satisfactory condition to undergo the procedure.                           After obtaining informed consent, the endoscope was  passed under direct vision. Throughout the                            procedure, the patient's blood pressure, pulse, and                            oxygen saturations were monitored continuously. The                            Endoscope was introduced through the mouth, and                            advanced to the second part of duodenum. The upper                            GI endoscopy was accomplished  without difficulty.                            The patient tolerated the procedure well. Scope In: Scope Out: Findings:                 The larynx was normal.                           Diffuse mucosal changes characterized by                            granularity and longitudinal markings were found in                            the middle third of the esophagus and in the lower                            third of the esophagus. Multiple biopsies were                            obtained in the upper third of the esophagus and in                            the lower third of the esophagus with cold forceps                            for histology to evaluate for changes of GERD or                            EoE.                           There is no endoscopic evidence of stricture in the                            entire esophagus.                           A 2  cm hiatal hernia was present.                           Patchy mild inflammation characterized by adherent                            blood was found in the entire examined stomach.                            Biopsies were taken with a cold forceps for                            histology (antrum and body, same jar).                           The exam of the stomach was otherwise normal,                            including on retroflexion. (Hill Grade 3)                           The examined duodenum was normal. Complications:            No immediate complications. Estimated Blood Loss:     Estimated blood loss was minimal. Impression:               - Normal larynx.                           - Granular, longitudinally marked mucosa in the                            esophagus.                           - 2 cm hiatal hernia.                           - Gastritis. Biopsied.                           - Normal examined duodenum.                           - Multiple biopsies were obtained in the upper                             third of the esophagus and in the lower third of                            the esophagus. Recommendation:           - Patient has a contact number available for                            emergencies. The signs and symptoms of potential  delayed complications were discussed with the                            patient. Return to normal activities tomorrow.                            Written discharge instructions were provided to the                            patient.                           - Resume previous diet.                           - Continue present medications.                           - Await pathology results.                           - Return to my office at appointment to be                            scheduled. [name] L. Loletha Carrow, MD 08/30/2020 12:09:15 PM This report has been signed electronically.

## 2020-08-30 NOTE — Progress Notes (Signed)
Called to room to assist during endoscopic procedure.  Patient ID and intended procedure confirmed with present staff. Received instructions for my participation in the procedure from the performing physician.  

## 2020-08-30 NOTE — Op Note (Signed)
[facility] Patient Name: [name] Procedure Date: 08/30/2020 11:16 AM MRN: [id number] Endoscopist: Mallie Mussel L. Loletha Carrow , MD Age: 55 Referring MD:  Date of Birth: 1965-09-18 Gender: Female Account #: [id number] Procedure:                Colonoscopy Indications:              Rectal bleeding Medicines:                Monitored Anesthesia Care Procedure:                Pre-Anesthesia Assessment:                           - Prior to the procedure, a History and Physical                            was performed, and patient medications and                            allergies were reviewed. The patient's tolerance of                            previous anesthesia was also reviewed. The risks                            and benefits of the procedure and the sedation                            options and risks were discussed with the patient.                            All questions were answered, and informed consent                            was obtained. Prior Anticoagulants: The patient has                            taken no previous anticoagulant or antiplatelet                            agents. ASA Grade Assessment: II - A patient with                            mild systemic disease. After reviewing the risks                            and benefits, the patient was deemed in                            satisfactory condition to undergo the procedure.                           After obtaining informed consent, the colonoscope  was passed under direct vision. Throughout the                            procedure, the patient's blood pressure, pulse, and                            oxygen saturations were monitored continuously. The                            Olympus CF-HQ190L (Serial# [id number]) Colonoscope was                            introduced through the anus and advanced to the the                            cecum, identified by appendiceal orifice  and                            ileocecal valve. The colonoscopy was performed                            without difficulty. The patient tolerated the                            procedure well. The quality of the bowel                            preparation was good. The ileocecal valve,                            appendiceal orifice, and rectum were photographed.                            The bowel preparation used was Plenvu. Scope In: 11:20:47 AM Scope Out: 31:51:76 AM Scope Withdrawal Time: 0 hours 22 minutes 4 seconds  Total Procedure Duration: 0 hours 25 minutes 54 seconds  Findings:                 The perianal exam findings include prolapsed                            internal hemorrhoids.                           A 12 mm polyp was found in the distal ascending                            colon. The polyp was flat. The polyp was removed                            with a piecemeal technique using a cold snare.                            Resection and retrieval were complete. To prevent  bleeding post-intervention, one hemostatic clip was                            successfully placed (MR conditional).                           A 5 mm polyp was found in the distal transverse                            colon. The polyp was sessile. The polyp was removed                            with a cold snare. Resection was complete, but the                            polyp tissue was not retrieved.                           Internal hemorrhoids were found. The hemorrhoids                            were large.                           The exam was otherwise without abnormality on                            direct and retroflexion views. Complications:            No immediate complications. Estimated Blood Loss:     Estimated blood loss was minimal. Impression:               - Hemorrhoids found on perianal exam.                           - One 12 mm polyp in the  distal ascending colon,                            removed piecemeal using a cold snare. Resected and                            retrieved. Clip (MR conditional) was placed.                           - One 5 mm polyp in the distal transverse colon,                            removed with a cold snare. Complete resection.                            Polyp tissue not retrieved.                           - Internal hemorrhoids.                           -  The examination was otherwise normal on direct                            and retroflexion views. Recommendation:           - Patient has a contact number available for                            emergencies. The signs and symptoms of potential                            delayed complications were discussed with the                            patient. Return to normal activities tomorrow.                            Written discharge instructions were provided to the                            patient.                           - Resume previous diet.                           - Continue present medications.                           - Await pathology results.                           - Repeat colonoscopy is recommended for                            surveillance. The colonoscopy date will be                            determined after pathology results from today's                            exam become available for review.                           - Consider hemorrhoidal banding.                           - See the other procedure note for documentation of                            additional recommendations. Delayza Lungren L. Loletha Carrow, MD 08/30/2020 12:03:42 PM This report has been signed electronically.

## 2020-08-30 NOTE — Progress Notes (Signed)
1118 Robinul 0.1 mg IV given due large amount of secretions upon assessment.  MD made aware, vss 

## 2020-08-30 NOTE — Progress Notes (Signed)
Report given to PACU, vss 

## 2020-09-02 ENCOUNTER — Telehealth: Payer: Self-pay

## 2020-09-02 NOTE — Telephone Encounter (Signed)
Spoke with patient, she has been scheduled for a follow up on Tuesday, 10/08/20 at 3:40 PM.

## 2020-09-02 NOTE — Telephone Encounter (Signed)
Per 08/30/20 procedure note - return to office at appointment to be scheduled.  Lm on vm for patient to return call.

## 2020-09-03 ENCOUNTER — Telehealth: Payer: Self-pay

## 2020-09-03 NOTE — Telephone Encounter (Signed)
  Follow up Call-  No flowsheet data found.   Patient questions:  Do you have a fever, pain , or abdominal swelling? No. Pain Score  0 *  Have you tolerated food without any problems? Yes.    Have you been able to return to your normal activities? Yes.    Do you have any questions about your discharge instructions: Diet   No. Medications  No. Follow up visit  No.  Do you have questions or concerns about your Care? No.  Actions: * If pain score is 4 or above: No action needed, pain <4.  1. Have you developed a fever since your procedure? no  2.   Have you had an respiratory symptoms (SOB or cough) since your procedure? no  3.   Have you tested positive for COVID 19 since your procedure nop  4.   Have you had any family members/close contacts diagnosed with the COVID 19 since your procedure?  no   If yes to any of these questions please route to Joylene John, RN and Joella Prince, RN

## 2020-09-03 NOTE — Telephone Encounter (Signed)
First post procedure follow up call, no answer 

## 2020-09-09 ENCOUNTER — Other Ambulatory Visit: Payer: Self-pay | Admitting: Gastroenterology

## 2020-09-09 ENCOUNTER — Other Ambulatory Visit: Payer: Self-pay

## 2020-09-09 ENCOUNTER — Encounter: Payer: Self-pay | Admitting: Gastroenterology

## 2020-09-09 MED ORDER — OMEPRAZOLE 40 MG PO CPDR: DELAYED_RELEASE_CAPSULE | ORAL | 1 refills | Status: DC

## 2020-09-10 ENCOUNTER — Other Ambulatory Visit: Payer: Self-pay | Admitting: Gastroenterology

## 2020-09-10 NOTE — Telephone Encounter (Signed)
Omeprazole not covered.  OK to switch to pantoprazole?

## 2020-09-10 NOTE — Telephone Encounter (Signed)
[name]) called, Nurtec has been denied. Wanting to know if you are going to appeal. Would like a call from the nurse.  Contact info: [phone]

## 2020-09-10 NOTE — Telephone Encounter (Signed)
We also received a written request. I have completed the form and faxed back to [facility]. We changed the Rx to acute use so we are not appealing for prevention.

## 2020-09-10 NOTE — Telephone Encounter (Signed)
Yes, a change to pantoprazole 40 mg twice daily would be fine.  The pharmacy sent back a denial and said omeprazole not covered, but gave no indication of what a less expensive formulary choice would be.

## 2020-09-11 ENCOUNTER — Other Ambulatory Visit: Payer: Self-pay | Admitting: Gastroenterology

## 2020-09-16 ENCOUNTER — Telehealth: Payer: Self-pay

## 2020-09-16 NOTE — Telephone Encounter (Signed)
Prior Authorization for Pantoprazole has been started on Cover My Meds.

## 2020-09-17 ENCOUNTER — Other Ambulatory Visit: Payer: Self-pay | Admitting: Neurology

## 2020-09-17 DIAGNOSIS — G43709 Chronic migraine without aura, not intractable, without status migrainosus: Secondary | ICD-10-CM

## 2020-09-18 NOTE — Telephone Encounter (Signed)
Rx has been approved. Protonix was 4 dollars pt has already picked up

## 2020-09-27 ENCOUNTER — Other Ambulatory Visit: Payer: Self-pay | Admitting: Neurology

## 2020-10-08 ENCOUNTER — Ambulatory Visit: Payer: BC Managed Care – PPO | Admitting: Gastroenterology

## 2020-10-08 ENCOUNTER — Encounter: Payer: Self-pay | Admitting: Gastroenterology

## 2020-10-08 VITALS — BP 100/78 | HR 88 | Ht 67.0 in | Wt 164.0 lb

## 2020-10-08 DIAGNOSIS — K648 Other hemorrhoids: Secondary | ICD-10-CM | POA: Diagnosis not present

## 2020-10-08 DIAGNOSIS — K2100 Gastro-esophageal reflux disease with esophagitis, without bleeding: Secondary | ICD-10-CM

## 2020-10-08 DIAGNOSIS — K5909 Other constipation: Secondary | ICD-10-CM

## 2020-10-08 DIAGNOSIS — R1319 Other dysphagia: Secondary | ICD-10-CM | POA: Diagnosis not present

## 2020-10-08 NOTE — Progress Notes (Signed)
[name] GI Progress Note  Chief Complaint: Constipation and rectal bleeding  Subjective  History: Clinic visit in January for longstanding chronic idiopathic constipation with bloating (former [facility] patient), rectal bleeding that sounded likely hemorrhoidal, and heartburn and dysphagia. She takes as needed Linzess for relief of constipation, and she schedules the timing of that because it causes evacuation with loose stool.  She had been given some samples of Motegrity, but decided not to take it since she thinks the Lupton works well enough. 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 Pylori. -She was then started on omeprazole 40 mg twice daily.  [name] reports that the dysphagia is improved on twice daily pantoprazole (it was switched for insurance reasons), and she is also more careful and has limited certain possible food triggers.  She is also cut down on ibuprofen use.  Constipation is the same, experience with Linzess is the same and she would like to continue that because it works predictably. She still has hemorrhoidal prolapse and bleeding with the effect of Linzess.  ROS: Cardiovascular:  no chest pain Respiratory: no dyspnea  The patient's Past Medical, Family and Social History were reviewed and are on file in the EMR.  Objective:  Med list reviewed  Current Outpatient Medications:  .  linaclotide (LINZESS) 145 MCG CAPS capsule, Take 1 capsule (145 mcg total) by mouth daily before breakfast., Disp: 30 capsule, Rfl: 2 .  methylPREDNISolone (MEDROL DOSEPAK) 4 MG TBPK tablet, May take daily pills each morning with food. Take for 6 days., Disp: 21 tablet, Rfl: 1 .  OVER THE  COUNTER MEDICATION, Takes claritin or Zyrtec, Disp: , Rfl:  .  pantoprazole (PROTONIX) 40 MG tablet, TAKE 1 TABLET (40 MG TOTAL) BY MOUTH 2 (TWO) TIMES DAILY BEFORE A MEAL., Disp: 60 tablet, Rfl: 3 .  Rimegepant Sulfate (NURTEC) 75 MG TBDP, Take 75 mg by mouth daily as needed. For migraines. Take as close to onset of migraine as possible. One daily maximum., Disp: 8 tablet, Rfl: 6 .  rizatriptan (MAXALT) 10 MG tablet, May repeat in 2 hours if needed, Disp: 18 tablet, Rfl: 11 .  spironolactone (ALDACTONE) 25 MG tablet, Take 25 mg by mouth daily., Disp: , Rfl:  .  topiramate (TOPAMAX) 100 MG tablet, Take 1 tablet (100 mg total) by mouth at bedtime., Disp: 90 tablet, Rfl: 4 .  tretinoin (RETIN-A) 0.025 % cream, as needed., Disp: , Rfl: 3   Vital signs in last 24 hrs: Vitals:   10/08/20 1541  BP: 100/78  Pulse: 88   Wt Readings from Last 3 Encounters:  10/08/20 164 lb (74.4 kg)  08/30/20 171 lb (77.6 kg)  08/20/20 161 lb (73 kg)    Physical Exam  No exam.  Entire visit spent in discussion Labs:   ___________________________________________ Radiologic studies:   ____________________________________________ Other: 1. Surgical [P], colon, ascending, polyp (1) - SESSILE SERRATED POLYP(S) WITHOUT CYTOLOGIC DYSPLASIA 2. Surgical [P], random gastric - GASTRIC ANTRAL MUCOSA WITH NONSPECIFIC REACTIVE GASTROPATHY - GASTRIC OXYNTIC MUCOSA WITH NO SPECIFIC HISTOPATHOLOGIC CHANGES - WARTHIN STARRY STAIN IS NEGATIVE FOR HELICOBACTER PYLORI 3. 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 SEE NOTE 4. Surgical [P], proximal esophagus (abnormal mucosa) - ESOPHAGEAL SQUAMOUS MUCOSA WITH MILD VASCULAR CONGESTION, AND FOCAL SQUAMOUS BALLOONING, SEE NOTE - NEGATIVE FOR INCREASED INTRAEPITHELIAL  EOSINOPHILS  _____________________________________________ Assessment & Plan  Assessment: Encounter Diagnoses  Name Primary?  . Chronic constipation Yes  . Bleeding internal hemorrhoids   . Esophageal dysphagia   . Gastroesophageal reflux disease with esophagitis without hemorrhage    He has chronic functional constipation relieved to her satisfaction with as needed use of Linzess.  That is causing hemorrhoidal prolapse and bleeding.  Hemorrhoids were large at the time of colonoscopy, perhaps somewhat more impressive due to the bowel prep. Nevertheless, I am not sure they are amenable to hemorrhoidal banding, especially since she will have ongoing constipation afterward. Therefore, I recommended a referral to Dr. Marcello Moores of colorectal surgery for evaluation.  If she either does not think the banding will work, or performs it and it is unsuccessful, then surgical hemorrhoidal resection seems warranted  Regarding the upper digestive symptoms, I discussed the nature reflux, hiatal hernia and treatment. We discussed the limitation of acid suppression therapy, breadth of diet and lifestyle changes required for reflux control, the possibility of reflux related complications such as esophagitis, stricture or Barrett's esophagus.  The distal esophagus eosinophilic infiltrate is impressive, though still most likely reflux other than EOE given the mid esophageal findings and response to PPI.  Continue twice daily PPI for another 4 weeks, then decrease to once daily.  I would like an update from her about 3 months on the heartburn and dysphagia.  So we can decide how to proceed with medicines.  30 minutes were spent on this encounter (including chart review, history/exam, counseling/coordination of care, and documentation) > 50% of that time was spent on counseling and coordination of care.  Topics discussed included: See above.  Nelida Meuse III

## 2020-10-08 NOTE — Patient Instructions (Signed)
If you are age 55 or older, your body mass index should be between 23-30. Your Body mass index is 25.69 kg/m. If this is out of the aforementioned range listed, please consider follow up with your Primary Care Provider.  If you are age 61 or younger, your body mass index should be between 19-25. Your Body mass index is 25.69 kg/m. If this is out of the aformentioned range listed, please consider follow up with your Primary Care Provider.   We have sent [facility] records to [facility]. They will reach out to you to [facility] [address]. Should you need to reschedule your appointment, please contact them at [phone].  It was a pleasure to see you today!  Dr. Loletha Carrow

## 2020-10-24 ENCOUNTER — Other Ambulatory Visit: Payer: Self-pay | Admitting: Nurse Practitioner

## 2020-10-25 ENCOUNTER — Other Ambulatory Visit: Payer: Self-pay | Admitting: Neurology

## 2020-10-25 DIAGNOSIS — G43709 Chronic migraine without aura, not intractable, without status migrainosus: Secondary | ICD-10-CM

## 2020-10-26 IMAGING — CT CT ABD-PELV W/O CM
1 of 2 series · 14 of 32 positions shown, 19 images · non-contrast
Comparison: 09/06/2013

CLINICAL DATA: Fever and hematuria for 1 week

EXAM:
CT ABDOMEN AND PELVIS WITHOUT CONTRAST
TECHNIQUE: Multidetector CT imaging of the abdomen and pelvis was performed
following the standard protocol without IV contrast.

[Series 2: abd/pelvis w/(date) · axial · 0.76mm/px · z∈[+438,+823]mm · 14 of 87 slices shown, 19 images]
[im 5/87  soft-tissue]
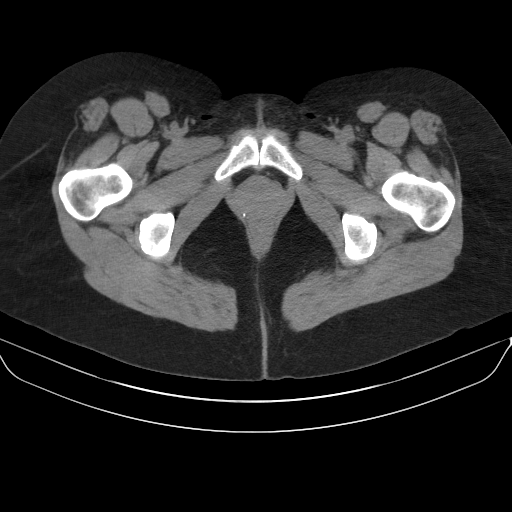
[im 5/87  bone]
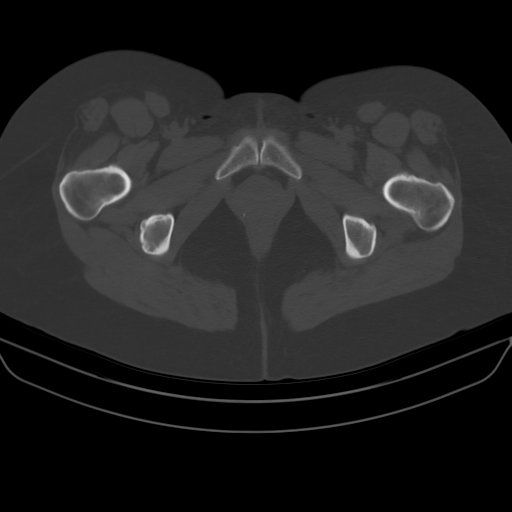
[im 13/87  soft-tissue]
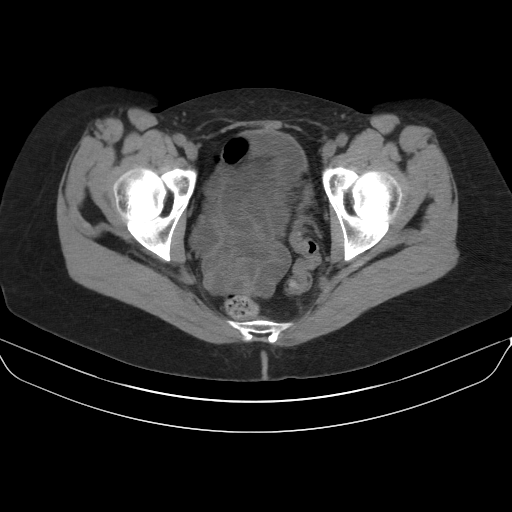
[im 18/87  soft-tissue]
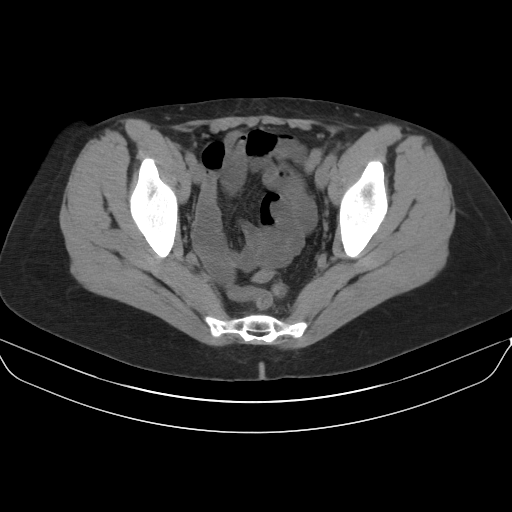
[im 26/87  soft-tissue]
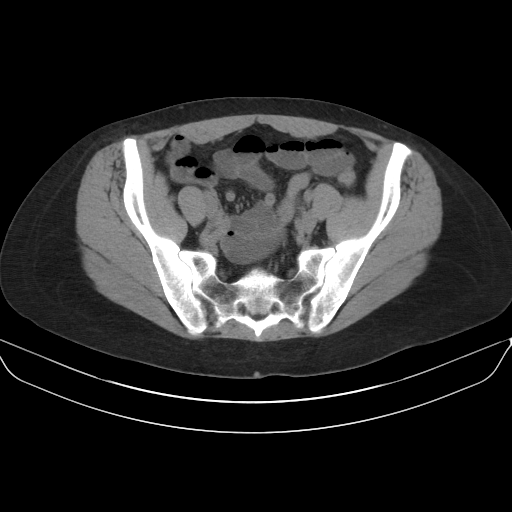
[im 31/87  soft-tissue]
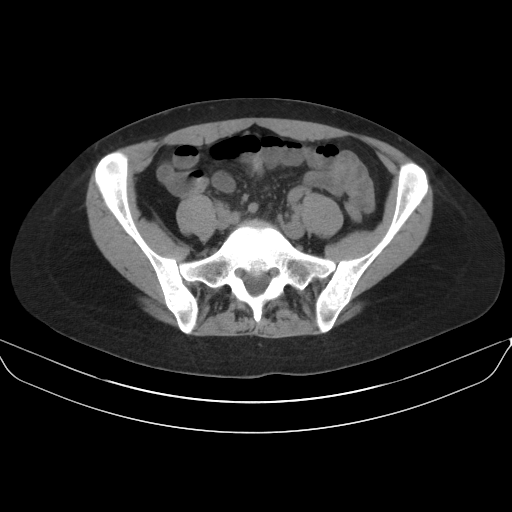
[im 39/87  soft-tissue]
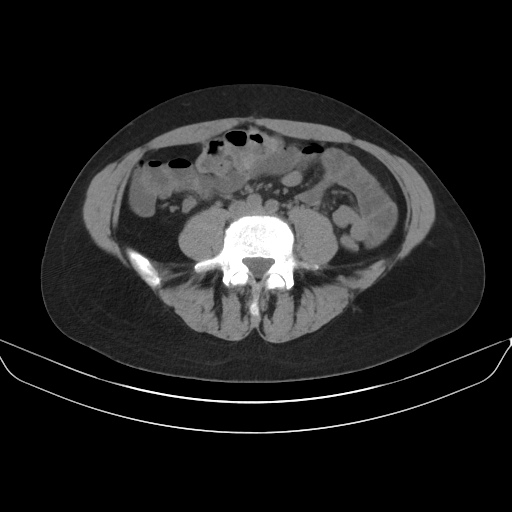
[im 44/87  soft-tissue]
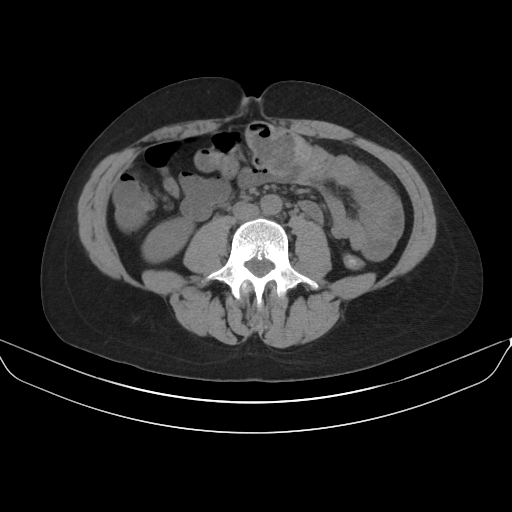
[im 48/87  soft-tissue]
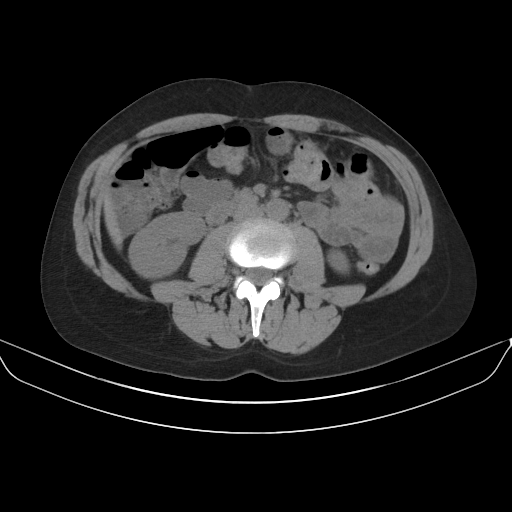
[im 56/87  soft-tissue]
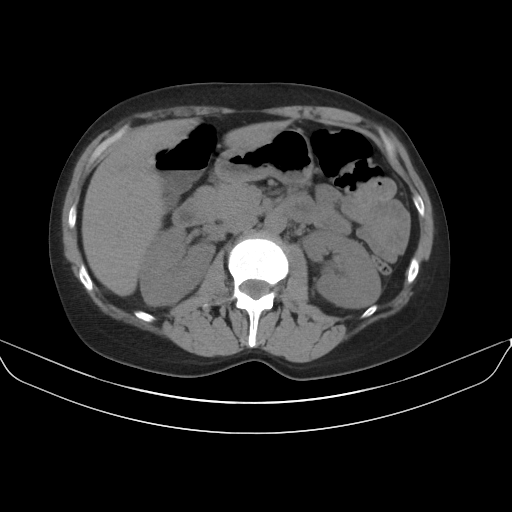
[im 56/87  bone]
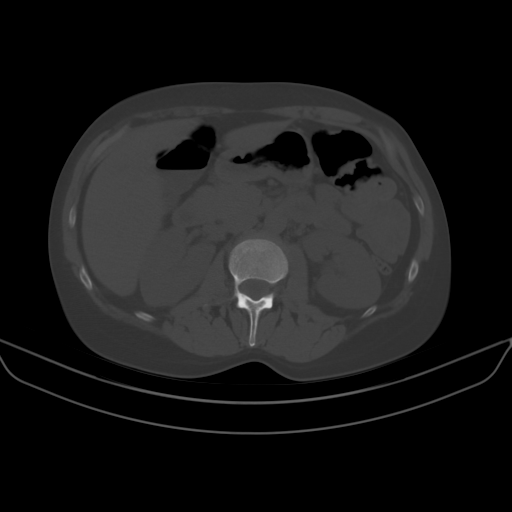
[im 61/87  soft-tissue]
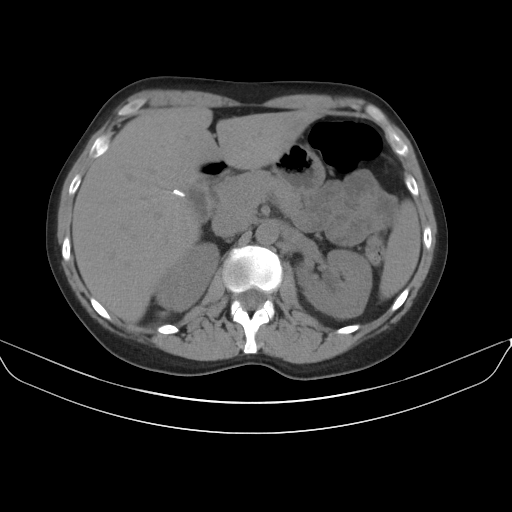
[im 69/87  soft-tissue]
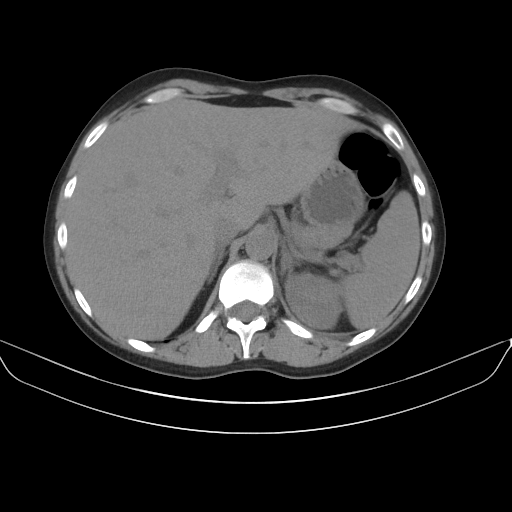
[im 69/87  lung]
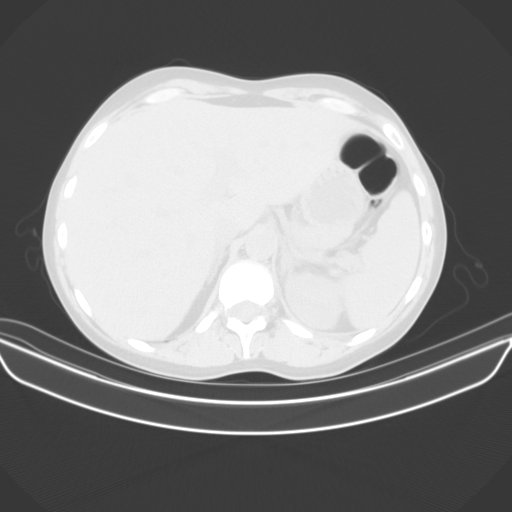
[im 74/87  soft-tissue]
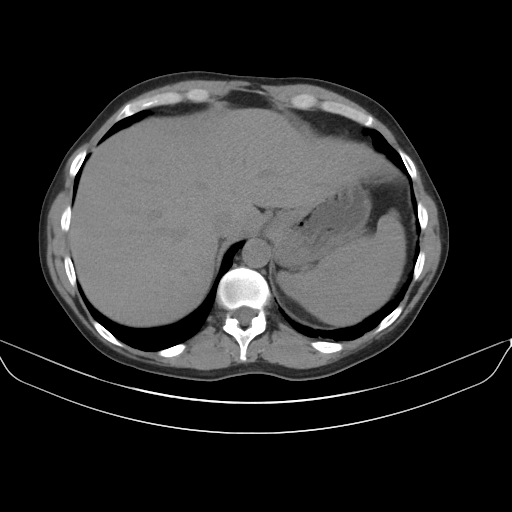
[im 74/87  lung]
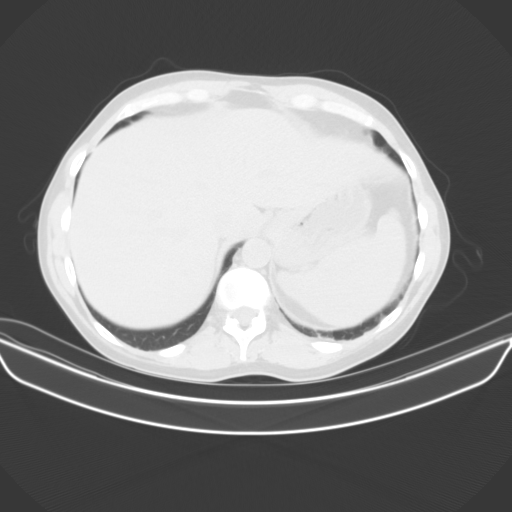
[im 78/87  lung]
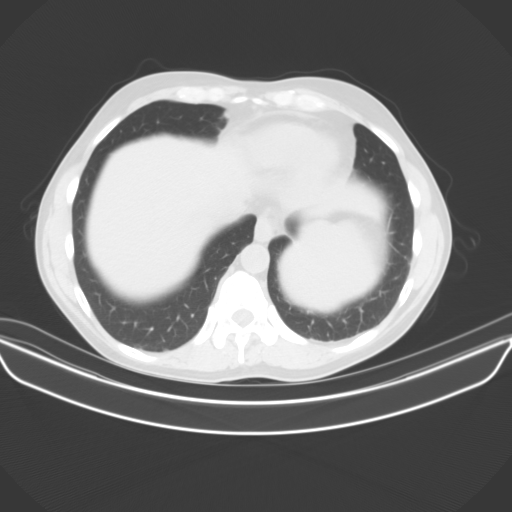
[im 82/87  soft-tissue]
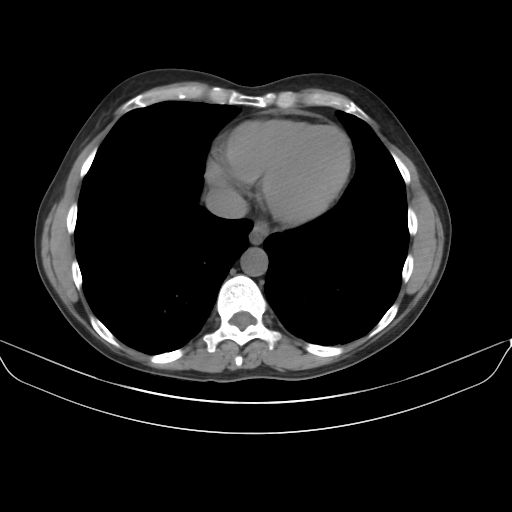
[im 82/87  lung]
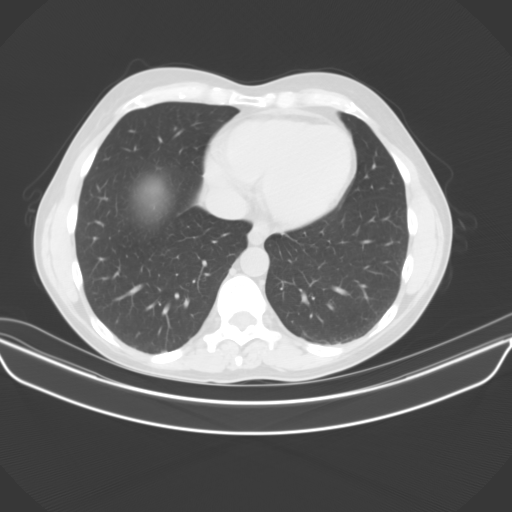

[14 of 32 positions shown; findings below may reference images not displayed]

FINDINGS: Lower chest: No acute abnormality.

Hepatobiliary: No focal liver abnormality is seen. Status post
cholecystectomy. No biliary dilatation.

Pancreas: Unremarkable. No pancreatic ductal dilatation or
surrounding inflammatory changes.

Spleen: Normal in size without focal abnormality.

Adrenals/Urinary Tract: Adrenal glands are within normal limits.
Kidneys are well visualized bilaterally. No renal or ureteral
calculi are identified. The bladder is partially distended.

Stomach/Bowel: The appendix is not well visualized although no
inflammatory changes to suggest appendicitis are seen. No
obstructive or inflammatory changes in the bowel are noted. The
stomach is within normal limits.

Vascular/Lymphatic: No significant vascular findings are present. No
enlarged abdominal or pelvic lymph nodes.

Reproductive: Status post hysterectomy. No adnexal masses.

Other: No abdominal wall hernia or abnormality. No abdominopelvic
ascites.

Musculoskeletal: No acute or significant osseous findings.
IMPRESSION: No acute abnormality is identified.

## 2020-12-26 ENCOUNTER — Telehealth: Payer: Self-pay | Admitting: Neurology

## 2020-12-26 NOTE — Telephone Encounter (Signed)
[email or web address] is asking for a call from RN of Dr Jaynee Eagles if assistance is needed re: the PA on pt's Nurtec.  She can be reached at [phone] option 1

## 2021-01-20 NOTE — Telephone Encounter (Signed)
Received Nutec PA forms. I have completed them, Dr Jaynee Eagles signed, and forms were faxed back to [facility] with last office note. Received a receipt of confirmation.

## 2021-01-28 NOTE — Telephone Encounter (Signed)
Received fax from [facility]. Nurtec has been approved from 01/20/21-01/19/22. Approval letter faxed to [employer]. Received a receipt of confirmation.

## 2021-01-28 NOTE — Telephone Encounter (Signed)
Assistance not needed. PA approved.

## 2021-03-06 ENCOUNTER — Other Ambulatory Visit: Payer: Self-pay | Admitting: Nurse Practitioner

## 2021-04-10 ENCOUNTER — Other Ambulatory Visit: Payer: Self-pay | Admitting: Gastroenterology

## 2021-04-16 ENCOUNTER — Other Ambulatory Visit: Payer: Self-pay | Admitting: Gastroenterology

## 2021-04-23 ENCOUNTER — Telehealth: Payer: Self-pay | Admitting: Gastroenterology

## 2021-04-23 NOTE — Telephone Encounter (Signed)
Patient called to follow up on previous request for refill on Linzess states it requires PA.

## 2021-04-24 NOTE — Telephone Encounter (Signed)
PA has been submitted with cover my meds.

## 2021-04-24 NOTE — Telephone Encounter (Signed)
Left a voicemail for patient that the PA has been denied. Samples left at the front desk until Appeal can be placed

## 2021-04-24 NOTE — Telephone Encounter (Signed)
Patient called regarding her PA for Linzess states her insurance will be denying the request also would like to know if she can be given samples.

## 2021-05-02 NOTE — Telephone Encounter (Signed)
A peer to peer review has been scheduled for 05-05-2021 @ 2 pm to seek an appeal.

## 2021-05-14 NOTE — Telephone Encounter (Signed)
Appeal has been approved. Pt is aware.

## 2021-07-08 DIAGNOSIS — L700 Acne vulgaris: Secondary | ICD-10-CM | POA: Diagnosis not present

## 2021-07-08 DIAGNOSIS — Z79899 Other long term (current) drug therapy: Secondary | ICD-10-CM | POA: Diagnosis not present

## 2021-07-08 DIAGNOSIS — L570 Actinic keratosis: Secondary | ICD-10-CM | POA: Diagnosis not present

## 2021-07-08 DIAGNOSIS — Z411 Encounter for cosmetic surgery: Secondary | ICD-10-CM | POA: Diagnosis not present

## 2021-07-30 DIAGNOSIS — R739 Hyperglycemia, unspecified: Secondary | ICD-10-CM | POA: Diagnosis not present

## 2021-07-30 DIAGNOSIS — R7989 Other specified abnormal findings of blood chemistry: Secondary | ICD-10-CM | POA: Diagnosis not present

## 2021-07-30 DIAGNOSIS — Z79899 Other long term (current) drug therapy: Secondary | ICD-10-CM | POA: Diagnosis not present

## 2021-08-04 DIAGNOSIS — Z0000 Encounter for general adult medical examination without abnormal findings: Secondary | ICD-10-CM | POA: Diagnosis not present

## 2021-08-04 DIAGNOSIS — G43009 Migraine without aura, not intractable, without status migrainosus: Secondary | ICD-10-CM | POA: Diagnosis not present

## 2021-08-04 DIAGNOSIS — Z1331 Encounter for screening for depression: Secondary | ICD-10-CM | POA: Diagnosis not present

## 2021-08-04 DIAGNOSIS — Z1339 Encounter for screening examination for other mental health and behavioral disorders: Secondary | ICD-10-CM | POA: Diagnosis not present

## 2021-08-20 ENCOUNTER — Other Ambulatory Visit: Payer: Self-pay | Admitting: Neurology

## 2021-08-20 ENCOUNTER — Telehealth: Payer: Self-pay | Admitting: Neurology

## 2021-08-20 ENCOUNTER — Telehealth: Payer: BC Managed Care – PPO | Admitting: Neurology

## 2021-08-20 ENCOUNTER — Encounter: Payer: Self-pay | Admitting: Neurology

## 2021-08-20 MED ORDER — RIZATRIPTAN BENZOATE 10 MG PO TABS: ORAL_TABLET | ORAL | 11 refills | Status: DC

## 2021-08-20 MED ORDER — NURTEC 75 MG PO TBDP: 75.0000 mg | ORAL_TABLET | Freq: Every day | ORAL | 1 refills | Status: DC | PRN

## 2021-08-20 MED ORDER — RIZATRIPTAN BENZOATE 10 MG PO TABS: ORAL_TABLET | ORAL | 0 refills | Status: DC

## 2021-08-20 MED ORDER — NURTEC 75 MG PO TBDP: 75.0000 mg | ORAL_TABLET | Freq: Every day | ORAL | 11 refills | Status: DC | PRN

## 2021-08-20 NOTE — Telephone Encounter (Signed)
Pt has r/s her missed appointment, pt states she is out of her medications and would like a refill on  rizatriptan (MAXALT) 10 MG tablet([facility] ) & Rimegepant Sulfate (NURTEC) 75 MG TBDP([facility] [employer] [phone])

## 2021-08-20 NOTE — Telephone Encounter (Signed)
Refill sent. I see she has been rescheduled.

## 2021-08-21 MED ORDER — NURTEC 75 MG PO TBDP
75.0000 mg | ORAL_TABLET | Freq: Every day | ORAL | 11 refills | Status: DC | PRN
Start: 2021-08-21 — End: 2021-10-28

## 2021-08-21 NOTE — Addendum Note (Signed)
Addended by: Gildardo Griffes on: 08/21/2021 08:22 AM   Modules accepted: Orders

## 2021-09-08 DIAGNOSIS — H04123 Dry eye syndrome of bilateral lacrimal glands: Secondary | ICD-10-CM | POA: Diagnosis not present

## 2021-09-08 DIAGNOSIS — H52203 Unspecified astigmatism, bilateral: Secondary | ICD-10-CM | POA: Diagnosis not present

## 2021-09-15 ENCOUNTER — Other Ambulatory Visit: Payer: Self-pay | Admitting: Neurology

## 2021-09-15 DIAGNOSIS — G43709 Chronic migraine without aura, not intractable, without status migrainosus: Secondary | ICD-10-CM

## 2021-09-22 ENCOUNTER — Encounter: Payer: Self-pay | Admitting: Neurology

## 2021-09-25 ENCOUNTER — Telehealth: Payer: Self-pay | Admitting: Neurology

## 2021-09-25 NOTE — Telephone Encounter (Signed)
Pt has a f/u scheduled with you in April. She was last seen Feb 2022. Please advise of weaning instructions for Topiramate 100 mg ([facility]). She is going to be donating a kidney.  ?

## 2021-09-25 NOTE — Telephone Encounter (Signed)
Patient is on Topiramate 100 mg at bedtime.  ? ?From last note: ? ?Consider Ajovy again. Pt didn't think it helped.  ?  ?Medications tried include: Gabapentin, topiramate up to 200 mg daily, steroids, Cymbalta, diclofenac, Zofran, Mobic, blood pressure medications Spironolactone, tizanidine ?

## 2021-09-25 NOTE — Telephone Encounter (Signed)
Pt would like a call from the nurse to discuss stop taking Topamax due to donating kidney to husband. They instructed to stop taking the Topamax. Need another alternative or how to stop taking it. ?

## 2021-10-20 ENCOUNTER — Ambulatory Visit: Payer: BC Managed Care – PPO | Admitting: Neurology

## 2021-10-22 ENCOUNTER — Other Ambulatory Visit: Payer: Self-pay | Admitting: Gastroenterology

## 2021-10-28 ENCOUNTER — Ambulatory Visit: Payer: BC Managed Care – PPO | Admitting: Neurology

## 2021-10-28 ENCOUNTER — Encounter: Payer: Self-pay | Admitting: Neurology

## 2021-10-28 VITALS — BP 129/89 | HR 96 | Ht 68.0 in | Wt 171.6 lb

## 2021-10-28 DIAGNOSIS — G43009 Migraine without aura, not intractable, without status migrainosus: Secondary | ICD-10-CM

## 2021-10-28 DIAGNOSIS — G43709 Chronic migraine without aura, not intractable, without status migrainosus: Secondary | ICD-10-CM

## 2021-10-28 MED ORDER — QULIPTA 60 MG PO TABS: 60.0000 mg | ORAL_TABLET | Freq: Every day | ORAL | 11 refills | Status: DC

## 2021-10-28 MED ORDER — RIZATRIPTAN BENZOATE 10 MG PO TABS: ORAL_TABLET | ORAL | 11 refills | Status: DC

## 2021-10-28 MED ORDER — KETOROLAC TROMETHAMINE 60 MG/2ML IM SOLN
60.0000 mg | Freq: Once | INTRAMUSCULAR | Status: AC
  Administered 2021-10-28: 60 mg via INTRAMUSCULAR

## 2021-10-28 MED ORDER — NURTEC 75 MG PO TBDP: 75.0000 mg | ORAL_TABLET | Freq: Every day | ORAL | 11 refills | Status: DC | PRN

## 2021-10-28 NOTE — Progress Notes (Addendum)
[facility]    Provider:  Dr [name] Referring Provider: Haywood Pao, MD Primary Care Physician:  Haywood Pao, MD  CC:  Migraine  Interval history Oct 28, 2021: Patient was on Topiramate 100 mg at bedtime.  She weaned off due to giving her husband kidney.  From last appointment we discussed considering Ajovy again but she did not think it helped.  Currently on Maxalt and Nurtec. She missed her last appointment. Did not like Ubrelvy. Could not tolerate Aimovig.   She is having 8 migraine days a month > 15 total  total headache days a month.     Medications tried include: Gabapentin, topiramate up to 200 mg daily, steroids, Cymbalta, diclofenac, Zofran, Mobic, blood pressure medications Spironolactone, tizanidine, Maxalt and Nurtec, ajovy, aimovig, ubrelvy, nurtec, ajovy, aimovig(side effects)  Patient complains of symptoms per HPI as well as the following symptoms: migraine . Pertinent negatives and positives per HPI. All others negative   08/20/20: She is doing well on current therapy. She does not like the Ubrelvy. She prefers Nurtec, we will see if we can get that approved for her, keep her on the topiramate. Also maxalt acutely. We discussed strategies for migraine acute management. She has 10 migraines a month, I offered to try and reduce that with other management or possibly trying ajovy again (she didn't think it helped). But she is happy with this freq and feels she can treat the migraines when they happen.  Interval history 09/13/2019: She is smelling strong cigarette smoke, happens often, no hx of seizures, she has sinus problems and I recommended starting with ENT, it is associated with masks and adding moisture in the nose helps and she uses saline spray. We discussed Ajovy again. We discussed Ajovy, nurtec. Discussed Frovatriptan and long-acting triptans. I can't refill her xanax, we had a talk about that.    Interval history 02/21/2019: Here for  follow up of migraines. She tried the [place] but she had constipation and could not tolerate. She saw Dr. [name] in the past and has been on multiple meds in the past. Worsening migraines. She is having most of the month if headaches and 10 days a month of migraines. She only took the [place]. Recommended Ajovy. Discussed oral medication.   HPI 11/11/2018:  [name] is a 56 y.o. female here as a referral from Dr. Osborne Casco for migraine. PMHX migraines, irritable bowel symptom, endometriosis, anxiety, anemia.  Patient is transitioning from 1 of my colleagues to me for migraine management.  Patient was previously seen by Dr. love.  Patient has a history of migraines for over 20 years.  She has an extensive family history of migraines.  She has been on multiple medications including Topamax up to 200 mg a day.  Her headaches are normally located in the right side of the head, throbbing and pulsating, she has osmophobia and smells can trigger, she endorses nausea, so pounding that she can't move, can be severe, also light sensitivity, no aura. 20 headache days a month, more than 10 are migrainous, can be severe and can last > 24 hours without treatment. No medication overuse.   Medications tried include: Gabapentin, topiramate up to 200 mg daily, steroids, Cymbalta, diclofenac, Zofran, Mobic, blood pressure medications Spironolactone, tizanidine  Reviewed notes, labs and imaging from outside physicians, which showed:  Reviewed EMG study, data and results which was normal no electrodiagnostic evidence of large fiber peripheral neuropathy or right lumbosacral radiculopathy.  MRI of the brain images  which I reviewed was normal November 2007.  Reviewed prior neurology notes: Headaches began in childhood since the age of 20.  Neuro is in the left side, and related to her menstrual cycle, and not associated with disturbances of nausea or vomiting or visual but does have a history of endometriosis.  Headaches  last intermittently for 5 days and are improved with ketoprofen, Maxalt therapy and coffee.  Her daughter has been to the headache clinic and uses Biofreeze to the scalp as well as Maxalt.  Extensive family history of the patient's daughter, son, grandmother and mother have migraines.  Her migraines are throbbing, she has smell sensitivity and triggers and an ice pick quality.  She has been on multiple packs of steroids.  She has chronic neck pain.  IV Depacon have worked acutely for her migraines in the past  Review of Systems: Patient complains of symptoms per HPI as well as the following symptoms: headache . Pertinent negatives and positives per HPI. All others negative    Social History   Socioeconomic History   Marital status: Married    Spouse name: [name]   Number of children: 2   Years of education: Not on file   Highest education level: Associate degree: academic program  Occupational History   Occupation: stay at home  Tobacco Use   Smoking status: Former   Smokeless tobacco: Never  Scientific laboratory technician Use: Never used  Substance and Sexual Activity   Alcohol use: Yes    Comment: social   Drug use: No   Sexual activity: Yes    Birth control/protection: Surgical  Other Topics Concern   Not on file  Social History Narrative   Patient lives at home with [name]. Drinks two cups of caffeine daily. Patient has two years of college education. Right handed.   Social Determinants of Health   Financial Resource Strain: Not on file  Food Insecurity: Not on file  Transportation Needs: Not on file  Physical Activity: Not on file  Stress: Not on file  Social Connections: Not on file  Intimate Partner Violence: Not on file    Family History  Problem Relation Age of Onset   Diabetes Mother        both sides grandparents   Hypertension Mother    Colon polyps Mother    Migraines Mother    Thyroid disease Father    Colon polyps Father    Migraines Maternal  Grandmother    Migraines Son    Migraines Daughter    Colon cancer Neg Hx    Stomach cancer Neg Hx    Esophageal cancer Neg Hx    Prostate cancer Neg Hx    Rectal cancer Neg Hx     Past Medical History:  Diagnosis Date   Anemia    Anxiety    Brachial neuritis or radiculitis NOS    COVID-19 virus infection    Endometriosis    H/O: hysterectomy    IBS (irritable bowel syndrome)    Migraine, unspecified, without mention of intractable migraine without mention of status migrainosus    Migraines    Neuropathy    Shoulder pain     Past Surgical History:  Procedure Laterality Date   ABDOMINAL HYSTERECTOMY     CHOLECYSTECTOMY     CYSTOSCOPY  11/17/2011   Procedure: CYSTOSCOPY;  Surgeon: Darlyn Chamber, MD;  Location: [facility];  Service: Gynecology;;   DIAGNOSTIC LAPAROSCOPY     LAPAROSCOPY  11/17/2011  Procedure: LAPAROSCOPY OPERATIVE;  Surgeon: Darlyn Chamber, MD;  Location: [facility];  Service: Gynecology;  Laterality: N/A;  with Injection of Vaginal Cuff with Dexamethasone mixed in 0.5% Bupivacaine   NASAL SEPTUM SURGERY     bone spur    Current Outpatient Medications  Medication Sig Dispense Refill   Atogepant (QULIPTA) 60 MG TABS Take 60 mg by mouth daily. 30 tablet 11   Galcanezumab-gnlm (EMGALITY) 120 MG/ML SOAJ Inject 120 mg into the skin every 30 (thirty) days. 1.12 mL 11   LINZESS 145 MCG CAPS capsule TAKE 1 CAPSULE BY MOUTH EVERY DAY BEFORE BREAKFAST 30 capsule 3   spironolactone (ALDACTONE) 25 MG tablet Take 25 mg by mouth daily.     tretinoin (RETIN-A) 0.025 % cream as needed.  3   NURTEC 75 MG TBDP TAKE 1 TAB DAILY AS NEEDED. FOR MIGRAINES. TAKE AS CLOSE TO ONSET OF MIGRAINE AS POSSIBLE. 8 tablet 1   rizatriptan (MAXALT) 10 MG tablet May repeat in 2 hours if needed 9 tablet 11   No current facility-administered medications for this visit.    Allergies as of 10/28/2021 - Review Complete 10/28/2021  Allergen Reaction Noted   Tylox [oxycodone-acetaminophen] Itching  11/20/2011   Codeine  08/05/2011    Vitals: BP 129/89   Pulse 96   Ht '5\' 8"'$  (1.727 m)   Wt 171 lb 9.6 oz (77.8 kg)   BMI 26.09 kg/m  Last Weight:  Wt Readings from Last 1 Encounters:  10/28/21 171 lb 9.6 oz (77.8 kg)   Last Height:   Ht Readings from Last 1 Encounters:  10/28/21 '5\' 8"'$  (1.727 m)  Exam: NAD, pleasant                  Speech:    Speech is normal; fluent and spontaneous with normal comprehension.  Cognition:    The patient is oriented to person, place, and time;     recent and remote memory intact;     language fluent;    Cranial Nerves:    The pupils are equal, round, and reactive to light.Trigeminal sensation is intact and the muscles of mastication are normal. The face is symmetric. The palate elevates in the midline. Hearing intact. Voice is normal. Shoulder shrug is normal. The tongue has normal motion without fasciculations.   Coordination:  No dysmetria  Motor Observation:    No asymmetry, no atrophy, and no involuntary movements noted. Tone:    Normal muscle tone.     Strength:    Strength is V/V in the upper and lower limbs.      Sensation: intact to LT     Assessment/Plan:  56 year old female with episodic migraines. Failed multiple classes of medications. She is having 8 migraine days a month > 15 total  total headache days a month.   Discussed botox in the past she declined, tried Ajovy, aimovig, nurtec, ubrelvy in the past Topamax discontnued, Nurtec and maxalt for acute management: continue discussed medication overuse headache, use no more than 10 days a month Refill maxalt, stop diclofenac(she has ulcer).  Prevention: Qulipta/atogepant was declined until she tries emgality, will order As needed: Nurtec Gave toradol injection today for migraine  Meds ordered this encounter  Medications   Atogepant (QULIPTA) 60 MG TABS    Sig: Take 60 mg by mouth daily.    Dispense:  30 tablet    Refill:  11    Episodic migraine.6-7 migraines  month < 14 total headache days month.  Meds tried: Gabapentin,topiramate,propranolol, Cymbalta,ajovy,amitriptyline,aimovig, Maxalt,imitrex, nurtec, Roselyn Meier   DISCONTD: Rimegepant Sulfate (NURTEC) 75 MG TBDP    Sig: Take 75 mg by mouth daily as needed. For migraines. Take as close to onset of migraine as possible. One daily maximum.    Dispense:  16 tablet    Refill:  11    She is having 6-7 migraine month usually < 14 total headache days mo. Episodic migraines. Tried imitrex, ubrelvy, sumatriptan, zomig, relpax   rizatriptan (MAXALT) 10 MG tablet    Sig: May repeat in 2 hours if needed    Dispense:  9 tablet    Refill:  11    May only fill once per month.   ketorolac (TORADOL) injection 60 mg   Galcanezumab-gnlm (EMGALITY) 120 MG/ML SOAJ    Sig: Inject 120 mg into the skin every 30 (thirty) days.    Dispense:  1.12 mL    Refill:  11     Discussed: To prevent or relieve headaches, try the following: Cool Compress. Lie down and place a cool compress on your head.  Avoid headache triggers. If certain foods or odors seem to have triggered your migraines in the past, avoid them. A headache diary might help you identify triggers.  Include physical activity in your daily routine. Try a daily walk or other moderate aerobic exercise.  Manage stress. Find healthy ways to cope with the stressors, such as delegating tasks on your to-do list.  Practice relaxation techniques. Try deep breathing, yoga, massage and visualization.  Eat regularly. Eating regularly scheduled meals and maintaining a healthy diet might help prevent headaches. Also, drink plenty of fluids.  Follow a regular sleep schedule. Sleep deprivation might contribute to headaches Consider biofeedback. With this mind-body technique, you learn to control certain bodily functions -- such as muscle tension, heart rate and blood pressure -- to prevent headaches or reduce headache pain.    Proceed to emergency room if you experience new or  worsening symptoms or symptoms do not resolve, if you have new neurologic symptoms or if headache is severe, or for any concerning symptom.   Provided education and documentation from American headache Society toolbox including articles on: chronic migraine medication overuse headache, chronic migraines, prevention of migraines, behavioral and other nonpharmacologic treatments for headache.    Sarina Ill, MD  [facility] [address] [place], [place] [address]  Phone [phone] Fax [phone]  I spent over 40 minutes of face-to-face and non-face-to-face time with patient on the  1. Chronic migraine without aura without status migrainosus, not intractable   2. Migraine without aura and without status migrainosus, not intractable    diagnosis.  This included previsit chart review, lab review, study review, order entry, electronic health record documentation, patient education on the different diagnostic and therapeutic options, counseling and coordination of care, risks and benefits of management, compliance, or risk factor reduction

## 2021-10-28 NOTE — Progress Notes (Signed)
Toradol 60 mg IM x 1 given in RUOQ of R buttock. Pt tolerated well. Aseptic technique maintained. Bandaid applied. See MAR.  ?

## 2021-10-28 NOTE — Patient Instructions (Signed)
Prevention: Qulipta/atogepant ?As needed: Nurtec ? ?Atogepant Tablets ?What is this medication? ?ATOGEPANT (a TOE je pant) prevents migraines. It works by blocking a substance in the body that causes migraines. ?This medicine may be used for other purposes; ask your health care provider or pharmacist if you have questions. ?COMMON BRAND NAME(S): QULIPTA ?What should I tell my care team before I take this medication? ?They need to know if you have any of these conditions: ?Kidney disease ?Liver disease ?An unusual or allergic reaction to atogepant, other medications, foods, dyes, or preservatives ?Pregnant or trying to get pregnant ?Breast-feeding ?How should I use this medication? ?Take this medication by mouth with water. Take it as directed on the prescription label at the same time every day. You can take it with or without food. If it upsets your stomach, take it with food. Keep taking it unless your care team tells you to stop. ?Talk to your care team about the use of this medication in children. Special care may be needed. ?Overdosage: If you think you have taken too much of this medicine contact a poison control center or emergency room at once. ?NOTE: This medicine is only for you. Do not share this medicine with others. ?What if I miss a dose? ?If you miss a dose, take it as soon as you can. If it is almost time for your next dose, take only that dose. Do not take double or extra doses. ?What may interact with this medication? ?Carbamazepine ?Certain medications for fungal infections, such as itraconazole, ketoconazole ?Clarithromycin ?Cyclosporine ?Efavirenz ?Etravirine ?Phenytoin ?Rifampin ?St. John's wort ?This list may not describe all possible interactions. Give your health care provider a list of all the medicines, herbs, non-prescription drugs, or dietary supplements you use. Also tell them if you smoke, drink alcohol, or use illegal drugs. Some items may interact with your medicine. ?What should I  watch for while using this medication? ?Visit your care team for regular checks on your progress. Tell your care team if your symptoms do not start to get better or if they get worse. ?What side effects may I notice from receiving this medication? ?Side effects that you should report to your care team as soon as possible: ?Allergic reactions--skin rash, itching, hives, swelling of the face, lips, tongue, or throat ?Side effects that usually do not require medical attention (report to your care team if they continue or are bothersome): ?Constipation ?Fatigue ?Loss of appetite with weight loss ?Nausea ?This list may not describe all possible side effects. Call your doctor for medical advice about side effects. You may report side effects to FDA at 1-800-FDA-1088. ?Where should I keep my medication? ?Keep out of the reach of children and pets. ?Store at room temperature between 20 and 25 degrees C (68 and 77 degrees F). Get rid of any unused medication after the expiration date. ?To get rid of medications that are no longer needed or have expired: ?Take the medication to a medication take-back program. Check with your pharmacy or law enforcement to find a location. ?If you cannot return the medication, check the label or package insert to see if the medication should be thrown out in the garbage or flushed down the toilet. If you are not sure, ask your care team. If it is safe to put it in the trash, take the medication out of the container. Mix the medication with cat litter, dirt, coffee grounds, or other unwanted substance. Seal the mixture in a bag or container. Put it  in the trash. ?NOTE: This sheet is a summary. It may not cover all possible information. If you have questions about this medicine, talk to your doctor, pharmacist, or health care provider. ?? 2023 Elsevier/Gold Standard (2021-08-04 00:00:00) ?Rimegepant Disintegrating Tablets ?What is this medication? ?RIMEGEPANT (ri ME je pant) prevents and treats  migraines. It works by blocking a substance in the body that causes migraines. ?This medicine may be used for other purposes; ask your health care provider or pharmacist if you have questions. ?COMMON BRAND NAME(S): NURTEC ODT ?What should I tell my care team before I take this medication? ?They need to know if you have any of these conditions: ?Kidney disease ?Liver disease ?An unusual or allergic reaction to rimegepant, other medications, foods, dyes, or preservatives ?Pregnant or trying to get pregnant ?Breast-feeding ?How should I use this medication? ?Take this medication by mouth. Take it as directed on the prescription label. Leave the tablet in the sealed pack until you are ready to take it. With dry hands, open the pack and gently remove the tablet. If the tablet breaks or crumbles, throw it away. Use a new tablet. Place the tablet in the mouth and allow it to dissolve. Then, swallow it. Do not cut, crush, or chew this medication. You do not need water to take this medication. ?Talk to your care team about the use of this medication in children. Special care may be needed. ?Overdosage: If you think you have taken too much of this medicine contact a poison control center or emergency room at once. ?NOTE: This medicine is only for you. Do not share this medicine with others. ?What if I miss a dose? ?This does not apply. This medication is not for regular use. ?What may interact with this medication? ?Certain medications for fungal infections, such as fluconazole, itraconazole ?Rifampin ?This list may not describe all possible interactions. Give your health care provider a list of all the medicines, herbs, non-prescription drugs, or dietary supplements you use. Also tell them if you smoke, drink alcohol, or use illegal drugs. Some items may interact with your medicine. ?What should I watch for while using this medication? ?Visit your care team for regular checks on your progress. Tell your care team if your  symptoms do not start to get better or if they get worse. ?What side effects may I notice from receiving this medication? ?Side effects that you should report to your care team as soon as possible: ?Allergic reactions--skin rash, itching, hives, swelling of the face, lips, tongue, or throat ?Side effects that usually do not require medical attention (report to your care team if they continue or are bothersome): ?Nausea ?Stomach pain ?This list may not describe all possible side effects. Call your doctor for medical advice about side effects. You may report side effects to FDA at 1-800-FDA-1088. ?Where should I keep my medication? ?Keep out of the reach of children and pets. ?Store at room temperature between 20 and 25 degrees C (68 and 77 degrees F). Get rid of any unused medication after the expiration date. ?To get rid of medications that are no longer needed or have expired: ?Take the medication to a medication take-back program. Check with your pharmacy or law enforcement to find a location. ?If you cannot return the medication, check the label or package insert to see if the medication should be thrown out in the garbage or flushed down the toilet. If you are not sure, ask your care team. If it is safe to put  it in the trash, take the medication out of the container. Mix the medication with cat litter, dirt, coffee grounds, or other unwanted substance. Seal the mixture in a bag or container. Put it in the trash. ?NOTE: This sheet is a summary. It may not cover all possible information. If you have questions about this medicine, talk to your doctor, pharmacist, or health care provider. ?? 2023 Elsevier/Gold Standard (2021-08-06 00:00:00) ? ?

## 2021-11-05 DIAGNOSIS — Z524 Kidney donor: Secondary | ICD-10-CM | POA: Diagnosis not present

## 2021-11-06 ENCOUNTER — Encounter: Payer: Self-pay | Admitting: Neurology

## 2021-11-12 DIAGNOSIS — Z1231 Encounter for screening mammogram for malignant neoplasm of breast: Secondary | ICD-10-CM | POA: Diagnosis not present

## 2021-11-12 DIAGNOSIS — Z01419 Encounter for gynecological examination (general) (routine) without abnormal findings: Secondary | ICD-10-CM | POA: Diagnosis not present

## 2021-11-12 DIAGNOSIS — Z1151 Encounter for screening for human papillomavirus (HPV): Secondary | ICD-10-CM | POA: Diagnosis not present

## 2021-11-12 DIAGNOSIS — Z1272 Encounter for screening for malignant neoplasm of vagina: Secondary | ICD-10-CM | POA: Diagnosis not present

## 2021-11-12 DIAGNOSIS — Z6826 Body mass index (BMI) 26.0-26.9, adult: Secondary | ICD-10-CM | POA: Diagnosis not present

## 2021-11-20 ENCOUNTER — Other Ambulatory Visit: Payer: Self-pay | Admitting: Neurology

## 2021-11-20 DIAGNOSIS — G43009 Migraine without aura, not intractable, without status migrainosus: Secondary | ICD-10-CM

## 2021-11-26 ENCOUNTER — Telehealth: Payer: Self-pay | Admitting: Neurology

## 2021-11-26 ENCOUNTER — Telehealth: Payer: Self-pay | Admitting: *Deleted

## 2021-11-26 DIAGNOSIS — G43009 Migraine without aura, not intractable, without status migrainosus: Secondary | ICD-10-CM

## 2021-11-26 DIAGNOSIS — Z005 Encounter for examination of potential donor of organ and tissue: Secondary | ICD-10-CM | POA: Diagnosis not present

## 2021-11-26 NOTE — Telephone Encounter (Signed)
PA is pending with [employer]. Derk has been updated. Will let him know when determination obtained.

## 2021-11-26 NOTE — Telephone Encounter (Signed)
[employer] (derk) calling to verify pt's insurance for Atogepant (QULIPTA) 60 MG TABS. Would like a call from the nurse.

## 2021-11-26 NOTE — Telephone Encounter (Signed)
Completed Qulipta PA on Cover My Meds. Key: [id number]. Awaiting determination from [place].

## 2021-11-27 NOTE — Telephone Encounter (Signed)
Ivin Booty from [facility] called to inform the PA was denied and to also inform a fax will be sent with explanation, their call back # is [phone] option 3 for the pharmacy

## 2021-11-27 NOTE — Telephone Encounter (Signed)
Received denial letter for [employer]. It has been denied because insurance requires trial and failure of Emgality first. If appeal desired, fax to [facility] [employer] [facility] Level 1 @ [phone].

## 2021-12-01 ENCOUNTER — Encounter: Payer: Self-pay | Admitting: *Deleted

## 2021-12-01 NOTE — Telephone Encounter (Signed)
Appeal faxed to [facility]. Received a receipt of confirmation.

## 2021-12-01 NOTE — Telephone Encounter (Signed)
From Dr Jaynee Eagles:  Since she does not have chronic migraines, I am not sure Emgality will get approved. She has 6-7 migraine days a month, that is why I tried [place]. Can we call insurance and tell them that?    Will do an appeal.

## 2021-12-01 NOTE — Telephone Encounter (Signed)
Letter written and is pending MD signature. Also printed office note to include.

## 2021-12-09 ENCOUNTER — Telehealth: Payer: Self-pay | Admitting: *Deleted

## 2021-12-09 MED ORDER — EMGALITY 120 MG/ML ~~LOC~~ SOAJ
120.0000 mg | SUBCUTANEOUS | 11 refills | Status: DC
Start: 2021-12-09 — End: 2021-12-23

## 2021-12-09 NOTE — Telephone Encounter (Signed)
Received Denial for Qulipta ( an adiditional clinical pharmacist review determined that the request does not meet the definition of medical necessity found in the members benefit booklet. Has to have tried ALL formulary alternatived.  She has tried [place].  She needs to have tried [employer].  REF # [id number].

## 2021-12-09 NOTE — Telephone Encounter (Signed)
Completed Emgality PA on Cover My Meds. Key: [id number]. Awaiting determination from [place].

## 2021-12-09 NOTE — Addendum Note (Signed)
Addended by: Brandon Melnick on: 12/09/2021 10:15 AM   Modules accepted: Orders

## 2021-12-09 NOTE — Addendum Note (Signed)
Addended by: Sarina Ill B on: 12/09/2021 12:45 PM   Modules accepted: Orders

## 2021-12-16 NOTE — Telephone Encounter (Signed)
Recevied PA approval form [employer]. Med has been approved from 12/09/2021-03/02/2022. Ref # [id number]

## 2021-12-23 MED ORDER — NURTEC 75 MG PO TBDP: 75.0000 mg | ORAL_TABLET | ORAL | 11 refills | Status: DC

## 2022-02-15 ENCOUNTER — Encounter: Payer: Self-pay | Admitting: Neurology

## 2022-02-16 NOTE — Telephone Encounter (Signed)
Completed Nurtec PA (for prevention) on Cover My Meds. Key: [id number]. Awaiting determination from [place].

## 2022-02-17 NOTE — Telephone Encounter (Signed)
Approved on August 21 Effective from 02/16/2022 through 05/10/2022. NOTE: approved for migraine prevention, 15 tablets per 30 days

## 2022-02-26 ENCOUNTER — Other Ambulatory Visit: Payer: Self-pay | Admitting: Gastroenterology

## 2022-03-03 NOTE — Telephone Encounter (Signed)
Spoke with [facility] pharmacy. Pharmacist says the August 31st refill was filled as 15 tablets and that is what they have on file for the prescription. They said if the patient didn't get 15 tablets she needs to call them because that would be a different issue.

## 2022-03-04 ENCOUNTER — Ambulatory Visit: Payer: BC Managed Care – PPO | Admitting: Adult Health

## 2022-03-23 ENCOUNTER — Other Ambulatory Visit: Payer: Self-pay | Admitting: Gastroenterology

## 2022-04-10 ENCOUNTER — Other Ambulatory Visit: Payer: Self-pay | Admitting: Gastroenterology

## 2022-04-10 NOTE — Telephone Encounter (Signed)
This medicine can be refilled at this dose for 2 months.  However, in order to receive future refills, she also needs an appointment to see me by sometime in December because her last office appointment was in April 2022.  -HD

## 2022-05-07 ENCOUNTER — Telehealth: Payer: Self-pay | Admitting: *Deleted

## 2022-05-07 NOTE — Telephone Encounter (Signed)
Received e mail, documents attached. Your information has been submitted to [place]. [employer] will review the request and notify you of the determination decision directly, typically within 72 hours of receiving all information.  If [employer] [place] has not responded within the specified timeframe or if you have any questions about your PA submission, contact [facility] directly at [phone].

## 2022-05-07 NOTE — Telephone Encounter (Signed)
[employer] PA, Key: [id number], faxed office notes to be attached.

## 2022-05-11 ENCOUNTER — Encounter: Payer: Self-pay | Admitting: Neurology

## 2022-05-26 ENCOUNTER — Encounter: Payer: Self-pay | Admitting: Neurology

## 2022-07-01 ENCOUNTER — Ambulatory Visit: Payer: BC Managed Care – PPO | Admitting: Orthopaedic Surgery

## 2022-07-01 ENCOUNTER — Ambulatory Visit (INDEPENDENT_AMBULATORY_CARE_PROVIDER_SITE_OTHER): Payer: BC Managed Care – PPO

## 2022-07-01 DIAGNOSIS — G8929 Other chronic pain: Secondary | ICD-10-CM

## 2022-07-01 DIAGNOSIS — M25561 Pain in right knee: Secondary | ICD-10-CM

## 2022-07-01 MED ORDER — BUPIVACAINE HCL 0.5 % IJ SOLN
2.0000 mL | INTRAMUSCULAR | Status: AC | PRN
  Administered 2022-07-01: 2 mL via INTRA_ARTICULAR

## 2022-07-01 MED ORDER — METHYLPREDNISOLONE ACETATE 40 MG/ML IJ SUSP
40.0000 mg | INTRAMUSCULAR | Status: AC | PRN
  Administered 2022-07-01: 40 mg via INTRA_ARTICULAR

## 2022-07-01 MED ORDER — LIDOCAINE HCL 1 % IJ SOLN
2.0000 mL | INTRAMUSCULAR | Status: AC | PRN
  Administered 2022-07-01: 2 mL

## 2022-07-01 NOTE — Progress Notes (Signed)
Office Visit Note   Patient: [name]           Date of Birth: 06-25-1966           MRN: [id number] Visit Date: 07/01/2022              Requested by: Haywood Pao, MD [address] [place],  [place] [address] PCP: Haywood Pao, MD   Assessment & Plan: Visit Diagnoses:  1. Chronic pain of right knee     Plan: Impression is right knee medial meniscus tear with small reactive effusion.  Findings were reviewed with the patient and treatment options were discussed.  We will start with aspiration cortisone injection today followed by 6 weeks of relative rest and activity modification.  If symptoms persist will need MRI to look for meniscus tear and rule out structural abnormalities.  Follow-Up Instructions: No follow-ups on file.   Orders:  Orders Placed This Encounter  Procedures   Large Joint Inj   XR KNEE 3 VIEW RIGHT   No orders of the defined types were placed in this encounter.     Procedures: Large Joint Inj: R knee on 07/01/2022 10:02 AM Indications: pain Details: 22 G needle  Arthrogram: No  Medications: 40 mg methylPREDNISolone acetate 40 MG/ML; 2 mL lidocaine 1 %; 2 mL bupivacaine 0.5 % Consent was given by the patient. Patient was prepped and draped in the usual sterile fashion.       Clinical Data: No additional findings.   Subjective: Chief Complaint  Patient presents with   Right Knee - Pain    HPI Ms. [name] is a 57 year old female who comes in for evaluation of right knee pain for about a month.  Denies any definite injuries or trauma.  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  Recently she has noticed pain and swelling and occasional mechanical symptoms with activity.  Brief period of rest did improve symptoms but resumption of activities restarted her symptoms.  Review of Systems   Objective: Vital Signs: There were no vitals taken for this visit.  Physical Exam Musculoskeletal:     Right knee:  Effusion present.     Instability Tests: Medial McMurray test positive.     Right Knee Exam   Muscle Strength  The patient has normal right knee strength.  Tenderness  The patient is experiencing tenderness in the medial joint line.  Range of Motion  The patient has normal right knee ROM.  Tests  McMurray:  Medial - positive  Varus: negative Valgus: negative Lachman:  Anterior - negative    Posterior - negative Drawer:  Anterior - negative    Posterior - negative  Other  Sensation: normal Pulse: present Effusion: effusion present      Specialty Comments:  No specialty comments available.  Imaging: XR KNEE 3 VIEW RIGHT  Result Date: 07/01/2022 No acute or structural abnormalities    PMFS History: Patient Active Problem List   Diagnosis Date Noted   Migraine without aura and without status migrainosus, not intractable 10/28/2021   Paresthesia 03/23/2017   Lateral epicondylitis of right elbow 01/22/2015   Shoulder pain    Pain in joint, shoulder region 05/04/2012   Other nonspecific abnormal result of function study of brain and central nervous system 05/04/2012   Pain in limb 05/04/2012   Cervical disc disorder with radiculopathy of cervical region 05/04/2012   Migraine headache 05/04/2012   Right foot pain 05/06/2011  Past Medical History:  Diagnosis Date   Anemia    Anxiety    Brachial neuritis or radiculitis NOS    COVID-19 virus infection    Endometriosis    H/O: hysterectomy    IBS (irritable bowel syndrome)    Migraine, unspecified, without mention of intractable migraine without mention of status migrainosus    Migraines    Neuropathy    Shoulder pain     Family History  Problem Relation Age of Onset   Diabetes Mother        both sides grandparents   Hypertension Mother    Colon polyps Mother    Migraines Mother    Thyroid disease Father    Colon polyps Father    Migraines Maternal Grandmother    Migraines Son    Migraines Daughter     Colon cancer Neg Hx    Stomach cancer Neg Hx    Esophageal cancer Neg Hx    Prostate cancer Neg Hx    Rectal cancer Neg Hx     Past Surgical History:  Procedure Laterality Date   ABDOMINAL HYSTERECTOMY     CHOLECYSTECTOMY     CYSTOSCOPY  11/17/2011   Procedure: CYSTOSCOPY;  Surgeon: Darlyn Chamber, MD;  Location: [facility];  Service: Gynecology;;   DIAGNOSTIC LAPAROSCOPY     LAPAROSCOPY  11/17/2011   Procedure: LAPAROSCOPY OPERATIVE;  Surgeon: Darlyn Chamber, MD;  Location: [facility];  Service: Gynecology;  Laterality: N/A;  with Injection of Vaginal Cuff with Dexamethasone mixed in 0.5% Bupivacaine   NASAL SEPTUM SURGERY     bone spur   Social History   Occupational History   Occupation: stay at home  Tobacco Use   Smoking status: Former   Smokeless tobacco: Never  Scientific laboratory technician Use: Never used  Substance and Sexual Activity   Alcohol use: Yes    Comment: social   Drug use: No   Sexual activity: Yes    Birth control/protection: Surgical

## 2022-07-23 ENCOUNTER — Other Ambulatory Visit: Payer: Self-pay

## 2022-07-23 ENCOUNTER — Encounter: Payer: Self-pay | Admitting: Orthopaedic Surgery

## 2022-07-23 DIAGNOSIS — G8929 Other chronic pain: Secondary | ICD-10-CM

## 2022-07-31 ENCOUNTER — Encounter: Payer: Self-pay | Admitting: *Deleted

## 2022-08-02 ENCOUNTER — Ambulatory Visit
Admission: RE | Admit: 2022-08-02 | Discharge: 2022-08-02 | Disposition: A | Discharge status: Home / Self Care | Payer: BC Managed Care – PPO | Source: Ambulatory Visit | Attending: Orthopaedic Surgery | Admitting: Orthopaedic Surgery

## 2022-08-02 DIAGNOSIS — M25561 Pain in right knee: Secondary | ICD-10-CM | POA: Diagnosis not present

## 2022-08-02 DIAGNOSIS — G8929 Other chronic pain: Secondary | ICD-10-CM

## 2022-08-03 ENCOUNTER — Telehealth: Payer: Self-pay | Admitting: Orthopaedic Surgery

## 2022-08-03 ENCOUNTER — Telehealth: Payer: Self-pay

## 2022-08-03 NOTE — Telephone Encounter (Signed)
Patient called back and was scheduled.

## 2022-08-03 NOTE — Telephone Encounter (Signed)
Tried to call patient. No answer. Trying to reschedule her for this Friday morning with Dr.Xu to go over MRI results. I ask her to call me back.

## 2022-08-03 NOTE — Telephone Encounter (Signed)
Patient states Dr. Erlinda Hong advised her to change her appointment from the 13th of Feb to this Friday. No openings..please advise

## 2022-08-07 ENCOUNTER — Ambulatory Visit: Payer: BC Managed Care – PPO | Admitting: Orthopaedic Surgery

## 2022-08-07 ENCOUNTER — Encounter: Payer: Self-pay | Admitting: Orthopaedic Surgery

## 2022-08-07 DIAGNOSIS — G8929 Other chronic pain: Secondary | ICD-10-CM | POA: Diagnosis not present

## 2022-08-07 DIAGNOSIS — M25561 Pain in right knee: Secondary | ICD-10-CM

## 2022-08-07 MED ORDER — PREDNISONE 10 MG (21) PO TBPK: ORAL_TABLET | ORAL | 3 refills | Status: DC

## 2022-08-07 NOTE — Progress Notes (Signed)
Office Visit Note   Patient: [name]           Date of Birth: 29-Dec-1965           MRN: [id number] Visit Date: 08/07/2022              Requested by: Meghan Pao, MD [address] [place],  [place] [address] PCP: Meghan Casco Fransico Him, MD   Assessment & Plan: Visit Diagnoses:  1. Chronic pain of right knee     Plan: MRI independently reviewed and interpreted.  Very small medial meniscal tear in the posterior horn which does not seem to explain her symptoms.  There is also mention of medial patellofemoral facet chondromalacia with some underlying subchondral edema.  I think this is a better explanation for her symptoms.  Her pattern of symptoms seem to be more consistent with chondromalacia than a meniscal tear.  Patient only has 1 kidney therefore we will try to avoid NSAIDs.  Would like to try a prednisone taper and a course of outpatient PT.  She will follow-up with me in about 6 weeks if she does not feel any improvement.  Follow-Up Instructions: No follow-ups on file.   Orders:  Orders Placed This Encounter  Procedures   Ambulatory referral to Physical Therapy   Meds ordered this encounter  Medications   predniSONE (STERAPRED UNI-PAK 21 TAB) 10 MG (21) TBPK tablet    Sig: Take as directed    Dispense:  21 tablet    Refill:  3      Procedures: No procedures performed   Clinical Data: No additional findings.   Subjective: Chief Complaint  Patient presents with   Right Knee - Follow-up    MRI review    HPI [name] returns today to discuss recent right knee MRI scan.  Reports pain to the medial side of the knee mainly over the medial femoral condyle. Review of Systems   Objective: Vital Signs: There were no vitals taken for this visit.  Physical Exam  Ortho Exam Examination shows vague tenderness to the anterior medial aspect of the medial femoral condyle.  No discrete no joint line tenderness.  Negative McMurray.  No joint  effusion. Specialty Comments:  No specialty comments available.  Imaging: No results found.   PMFS History: Patient Active Problem List   Diagnosis Date Noted   Migraine without aura and without status migrainosus, not intractable 10/28/2021   Paresthesia 03/23/2017   Lateral epicondylitis of right elbow 01/22/2015   Shoulder pain    Pain in joint, shoulder region 05/04/2012   Other nonspecific abnormal result of function study of brain and central nervous system 05/04/2012   Pain in limb 05/04/2012   Cervical disc disorder with radiculopathy of cervical region 05/04/2012   Migraine headache 05/04/2012   Right foot pain 05/06/2011   Past Medical History:  Diagnosis Date   Anemia    Anxiety    Brachial neuritis or radiculitis NOS    COVID-19 virus infection    Endometriosis    H/O: hysterectomy    IBS (irritable bowel syndrome)    Migraine, unspecified, without mention of intractable migraine without mention of status migrainosus    Migraines    Neuropathy    Shoulder pain     Family History  Problem Relation Age of Onset   Diabetes Mother        both sides grandparents   Hypertension Mother    Colon polyps Mother    Migraines Mother  Thyroid disease Father    Colon polyps Father    Migraines Maternal Grandmother    Migraines Son    Migraines Daughter    Colon cancer Neg Hx    Stomach cancer Neg Hx    Esophageal cancer Neg Hx    Prostate cancer Neg Hx    Rectal cancer Neg Hx     Past Surgical History:  Procedure Laterality Date   ABDOMINAL HYSTERECTOMY     CHOLECYSTECTOMY     CYSTOSCOPY  11/17/2011   Procedure: CYSTOSCOPY;  Surgeon: Darlyn Chamber, MD;  Location: [facility];  Service: Gynecology;;   DIAGNOSTIC LAPAROSCOPY     LAPAROSCOPY  11/17/2011   Procedure: LAPAROSCOPY OPERATIVE;  Surgeon: Darlyn Chamber, MD;  Location: [facility];  Service: Gynecology;  Laterality: N/A;  with Injection of Vaginal Cuff with Dexamethasone mixed in 0.5% Bupivacaine   NASAL  SEPTUM SURGERY     bone spur   Social History   Occupational History   Occupation: stay at home  Tobacco Use   Smoking status: Former   Smokeless tobacco: Never  Scientific laboratory technician Use: Never used  Substance and Sexual Activity   Alcohol use: Yes    Comment: social   Drug use: No   Sexual activity: Yes    Birth control/protection: Surgical

## 2022-08-11 ENCOUNTER — Ambulatory Visit: Payer: BC Managed Care – PPO | Admitting: Orthopaedic Surgery

## 2022-08-11 DIAGNOSIS — R739 Hyperglycemia, unspecified: Secondary | ICD-10-CM | POA: Diagnosis not present

## 2022-08-11 DIAGNOSIS — K219 Gastro-esophageal reflux disease without esophagitis: Secondary | ICD-10-CM | POA: Diagnosis not present

## 2022-08-20 ENCOUNTER — Ambulatory Visit: Payer: BC Managed Care – PPO | Admitting: Physical Therapy

## 2022-08-26 ENCOUNTER — Other Ambulatory Visit: Payer: Self-pay | Admitting: Neurology

## 2022-08-26 DIAGNOSIS — G43009 Migraine without aura, not intractable, without status migrainosus: Secondary | ICD-10-CM

## 2022-08-28 DIAGNOSIS — K589 Irritable bowel syndrome without diarrhea: Secondary | ICD-10-CM | POA: Diagnosis not present

## 2022-08-28 DIAGNOSIS — Z1331 Encounter for screening for depression: Secondary | ICD-10-CM | POA: Diagnosis not present

## 2022-08-28 DIAGNOSIS — Z0000 Encounter for general adult medical examination without abnormal findings: Secondary | ICD-10-CM | POA: Diagnosis not present

## 2022-08-28 DIAGNOSIS — Z1339 Encounter for screening examination for other mental health and behavioral disorders: Secondary | ICD-10-CM | POA: Diagnosis not present

## 2022-08-28 DIAGNOSIS — F321 Major depressive disorder, single episode, moderate: Secondary | ICD-10-CM | POA: Diagnosis not present

## 2022-08-28 DIAGNOSIS — K5909 Other constipation: Secondary | ICD-10-CM | POA: Diagnosis not present

## 2022-08-28 DIAGNOSIS — G43009 Migraine without aura, not intractable, without status migrainosus: Secondary | ICD-10-CM | POA: Diagnosis not present

## 2022-08-29 ENCOUNTER — Encounter: Payer: Self-pay | Admitting: Orthopaedic Surgery

## 2022-08-31 ENCOUNTER — Ambulatory Visit: Payer: BC Managed Care – PPO | Admitting: Physical Therapy

## 2022-08-31 ENCOUNTER — Encounter: Payer: Self-pay | Admitting: Physical Therapy

## 2022-08-31 ENCOUNTER — Other Ambulatory Visit: Payer: Self-pay

## 2022-08-31 DIAGNOSIS — M25561 Pain in right knee: Secondary | ICD-10-CM

## 2022-08-31 DIAGNOSIS — R262 Difficulty in walking, not elsewhere classified: Secondary | ICD-10-CM

## 2022-08-31 DIAGNOSIS — R600 Localized edema: Secondary | ICD-10-CM

## 2022-08-31 DIAGNOSIS — M6281 Muscle weakness (generalized): Secondary | ICD-10-CM | POA: Diagnosis not present

## 2022-08-31 DIAGNOSIS — G8929 Other chronic pain: Secondary | ICD-10-CM

## 2022-08-31 NOTE — Therapy (Signed)
OUTPATIENT PHYSICAL THERAPY LOWER EXTREMITY EVALUATION   Patient Name: [name] MRN: [id number] DOB:October 14, 1965, 57 y.o., female Today's Date: 08/31/2022  END OF SESSION:  PT End of Session - 08/31/22 1348     Visit Number 1    Number of Visits 18    Date for PT Re-Evaluation 11/13/22    PT Start Time 1350    PT Stop Time 1430    PT Time Calculation (min) 40 min    Activity Tolerance Patient tolerated treatment well    Behavior During Therapy WFL for tasks assessed/performed             Past Medical History:  Diagnosis Date   Anemia    Anxiety    Brachial neuritis or radiculitis NOS    COVID-19 virus infection    Endometriosis    H/O: hysterectomy    IBS (irritable bowel syndrome)    Migraine, unspecified, without mention of intractable migraine without mention of status migrainosus    Migraines    Neuropathy    Shoulder pain    Past Surgical History:  Procedure Laterality Date   ABDOMINAL HYSTERECTOMY     CHOLECYSTECTOMY     CYSTOSCOPY  11/17/2011   Procedure: CYSTOSCOPY;  Surgeon: Darlyn Chamber, MD;  Location: [facility];  Service: Gynecology;;   DIAGNOSTIC LAPAROSCOPY     LAPAROSCOPY  11/17/2011   Procedure: LAPAROSCOPY OPERATIVE;  Surgeon: Darlyn Chamber, MD;  Location: [facility];  Service: Gynecology;  Laterality: N/A;  with Injection of Vaginal Cuff with Dexamethasone mixed in 0.5% Bupivacaine   NASAL SEPTUM SURGERY     bone spur   Patient Active Problem List   Diagnosis Date Noted   Migraine without aura and without status migrainosus, not intractable 10/28/2021   Paresthesia 03/23/2017   Lateral epicondylitis of right elbow 01/22/2015   Shoulder pain    Pain in joint, shoulder region 05/04/2012   Other nonspecific abnormal result of function study of brain and central nervous system 05/04/2012   Pain in limb 05/04/2012   Cervical disc disorder with radiculopathy of cervical region 05/04/2012   Migraine headache 05/04/2012   Right foot pain 05/06/2011     PCP: Haywood Pao, MD   REFERRING PROVIDER:  Leandrew Koyanagi, MD   REFERRING DIAG: [phone] (ICD-10-CM) - Chronic pain of right knee  THERAPY DIAG:  Chronic pain of right knee  Muscle weakness (generalized)  Difficulty in walking, not elsewhere classified  Localized edema  Rationale for Evaluation and Treatment: Rehabilitation  ONSET DATE: 06/03/23  SUBJECTIVE:   SUBJECTIVE STATEMENT: Pt stating she started prednisone yesterday and her pain has decreased. Pt stating she did a trial of prednisone before but as soon as she stopped her pain returned. 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   PERTINENT HISTORY: Anemia, anxiety, migraines, neuropathy, shoulder pain, IBS, brachial neuritis PAIN:  NPRS scale: 1/10 Pain location: right knee Pain description: stabbing pain on medial inferior patella Aggravating factors: kneeling down to clean cabinets Relieving factors: prednisone  PRECAUTIONS: None  WEIGHT BEARING RESTRICTIONS: No  FALLS:  Has patient fallen in last 6 months? No  LIVING ENVIRONMENT: Lives with: lives with their family and lives with their spouse Lives in: House/apartment Stairs: Yes: Internal: flight steps; on left going up Has following equipment at home: None  OCCUPATION: not working  PLOF: Independent  PATIENT GOALS: play tennis  Next MD visit:   OBJECTIVE:   DIAGNOSTIC  FINDINGS:  Per MD note: Very small medial meniscal tear in the posterior horn which does not seem to explain her symptoms.  There is also mention of medial patellofemoral facet chondromalacia with some underlying subchondral edema.   PATIENT SURVEYS:  08/31/22: FOTO intake:   61%   COGNITION: Overall cognitive status: WFL    SENSATION: WFL  EDEMA:  08/31/22: Circumferential: 6 centimeters above superior patella Rt: 51 centimeters Left: 47.5 centimeters  MUSCLE LENGTH: Hamstrings: Right 95 deg; Left 100  deg   POSTURE: rounded shoulders and forward head  PALPATION: 08/31/22: tenderness along medial joint line  LOWER EXTREMITY ROM:   ROM:  A= active P= passive Right 08/31/22 Left 08/31/22  Hip flexion 126 130  Hip extension    Hip abduction    Hip adduction    Hip internal rotation    Hip external rotation    Knee flexion A: 130 P: 134 With pain A: 146  Knee extension A: 0  A: 0  Ankle dorsiflexion    Ankle plantarflexion    Ankle inversion    Ankle eversion     (Blank rows = not tested)  LOWER EXTREMITY MMT:  MMT Right 08/31/22 Left 08/31/22  Hip flexion 5 5  Hip extension    Hip abduction    Hip adduction    Hip internal rotation 4+ 5  Hip external rotation 4+ 5  Knee flexion 4+ 5  Knee extension 4+ 5  Ankle dorsiflexion    Ankle plantarflexion    Ankle inversion    Ankle eversion     (Blank rows = not tested)  LOWER EXTREMITY SPECIAL TESTS:  08/31/22: Knee special tests: Lachman Test: negative and Patellafemoral apprehension test: negative  FUNCTIONAL TESTS:  08/31/22:  5 time sit to stand 9 sec no UE support  GAIT: Distance walked: 30 feet  Assistive device utilized: None Level of assistance: Complete Independence Comments: normalize gait pattern   TODAY'S TREATMENT                                                                          DATE: 08/31/22 Therex:    HEP instruction/performance c cues for techniques, handout provided.  Trial set performed of each for comprehension and symptom assessment.  See below for exercise list  Trigger Point Dry-Needling  Treatment instructions: Expect mild to moderate muscle soreness. S/S of pneumothorax if dry needled over a lung field, and to seek immediate medical attention should they occur. Patient verbalized understanding of these instructions and education.  Patient Consent Given: Yes Education handout provided: Yes Muscles treated: Vastus intermedius and vastus medialis Treatment response/outcome: twitch  response noted   PATIENT EDUCATION:  Education details: HEP, POC Person educated: Patient Education method: Consulting civil engineer, Demonstration, Verbal cues, and Handouts Education comprehension: verbalized understanding, returned demonstration, and verbal cues required  HOME EXERCISE PROGRAM: Access Code: [email or web address] URL: [email or web address] Date: 08/31/2022 Prepared by: Kearney Hard  Exercises - Supine Knee Extension Strengthening  - 2 x daily - 7 x weekly - 2 sets - 10 reps - 5 sec hold - Straight Leg Raise with External Rotation  - 2 x daily - 7 x weekly - 2 sets - 10 reps - Prone Hip Internal and External Rotation  AROM  - 2 x daily - 7 x weekly - 5 reps - Bridge with Hip Abduction and Resistance  - 2 x daily - 7 x weekly - 2 sets - 10 reps - Mini Squat  - 2 x daily - 7 x weekly - 2 sets - 10 reps  ASSESSMENT:  CLINICAL IMPRESSION: Patient is a 56 y.o. who comes to clinic with complaints of right knee pain c dx of medial meniscal tear in the posterior horn and medial patellofemoral facet chondromalacia with mobility, strength and movement coordination deficits that impair their ability to perform usual daily and recreational functional activities without increase difficulty/symptoms at this time. Pt with 3.5 centimeters of swelling noted in Rt distal quad. Pt with good response to DN this visit.  Patient to benefit from skilled PT services to address impairments and limitations to improve to previous level of function without restriction secondary to condition.   OBJECTIVE IMPAIRMENTS: decreased balance, decreased mobility, difficulty walking, decreased ROM, decreased strength, increased edema, impaired flexibility, postural dysfunction, and pain.   ACTIVITY LIMITATIONS: bending, standing, squatting, sleeping, and stairs  PARTICIPATION LIMITATIONS: community activity and occupation  PERSONAL FACTORS: 1-2 comorbidities: see above pertinent history  are also affecting  patient's functional outcome.   REHAB POTENTIAL: Good  CLINICAL DECISION MAKING: Stable/uncomplicated  EVALUATION COMPLEXITY: Low   GOALS: Goals reviewed with patient? Yes  SHORT TERM GOALS: (target date for Short term goals are 3 weeks 09/25/22)   1.  Patient will demonstrate independent use of home exercise program to maintain progress from in clinic treatments.  Goal status: New  LONG TERM GOALS: (target dates for all long term goals are 10 weeks  11/13/22 )   1. Patient will demonstrate/report pain at worst less than or equal to 2/10 to facilitate minimal limitation in daily activity secondary to pain symptoms.  Goal status: New   2. Patient will demonstrate independent use of home exercise program to facilitate ability to maintain/progress functional gains from skilled physical therapy services.  Goal status: New   3. Patient will demonstrate FOTO outcome > or = 77% to indicate reduced disability due to condition.  Goal status: New   4.  Patient will demonstrate right LE MMT 5/5 throughout to faciltiate usual transfers, stairs, squatting at [facility] for daily life.   Goal status: New   5.  Patient will demonstrate 1 flight stair navigation with no pain using single hand rail.  Goal status: New   6.  Pt will be able to demonstrate full squat and kneel with no Rt quad pain noted for improvements in functional mobility.  Goal status: New      PLAN:  PT FREQUENCY: 1-2x/week  PT DURATION: 10 weeks  PLANNED INTERVENTIONS: Therapeutic exercises, Therapeutic activity, Neuro Muscular re-education, Balance training, Gait training, Patient/Family education, Joint mobilization, Stair training, DME instructions, Dry Needling, Electrical stimulation, Traction, Cryotherapy, vasopneumatic deviceMoist heat, Taping, Ultrasound, Ionotophoresis '4mg'$ /ml Dexamethasone, and Manual therapy.  All included unless contraindicated  PLAN FOR NEXT SESSION:  Assess response to DN, Review HEP  knowledge/results, quad strengthening, knee stability, K-taping edu per pt request      Oretha Caprice, PT, MPT 08/31/2022, 1:49 PM

## 2022-09-02 ENCOUNTER — Encounter: Payer: Self-pay | Admitting: Rehabilitative and Restorative Service Providers"

## 2022-09-02 ENCOUNTER — Ambulatory Visit: Payer: BC Managed Care – PPO | Admitting: Rehabilitative and Restorative Service Providers"

## 2022-09-02 DIAGNOSIS — G8929 Other chronic pain: Secondary | ICD-10-CM

## 2022-09-02 DIAGNOSIS — M25561 Pain in right knee: Secondary | ICD-10-CM

## 2022-09-02 DIAGNOSIS — M6281 Muscle weakness (generalized): Secondary | ICD-10-CM | POA: Diagnosis not present

## 2022-09-02 DIAGNOSIS — R600 Localized edema: Secondary | ICD-10-CM

## 2022-09-02 DIAGNOSIS — R262 Difficulty in walking, not elsewhere classified: Secondary | ICD-10-CM | POA: Diagnosis not present

## 2022-09-02 NOTE — Therapy (Signed)
OUTPATIENT PHYSICAL THERAPY TREATMENT   Patient Name: [name] MRN: [id number] DOB:[birth year], 57 y.o., female Today's Date: 09/02/2022  END OF SESSION:  PT End of Session - 09/02/22 0931     Visit Number 2    Number of Visits 18    Date for PT Re-Evaluation 11/13/22    PT Start Time 0931    PT Stop Time 1011    PT Time Calculation (min) 40 min    Activity Tolerance Patient tolerated treatment well    Behavior During Therapy WFL for tasks assessed/performed              Past Medical History:  Diagnosis Date   Anemia    Anxiety    Brachial neuritis or radiculitis NOS    COVID-19 virus infection    Endometriosis    H/O: hysterectomy    IBS (irritable bowel syndrome)    Migraine, unspecified, without mention of intractable migraine without mention of status migrainosus    Migraines    Neuropathy    Shoulder pain    Past Surgical History:  Procedure Laterality Date   ABDOMINAL HYSTERECTOMY     CHOLECYSTECTOMY     CYSTOSCOPY  11/17/2011   Procedure: CYSTOSCOPY;  Surgeon: Darlyn Chamber, MD;  Location: [facility];  Service: Gynecology;;   DIAGNOSTIC LAPAROSCOPY     LAPAROSCOPY  11/17/2011   Procedure: LAPAROSCOPY OPERATIVE;  Surgeon: Darlyn Chamber, MD;  Location: [facility];  Service: Gynecology;  Laterality: N/A;  with Injection of Vaginal Cuff with Dexamethasone mixed in 0.5% Bupivacaine   NASAL SEPTUM SURGERY     bone spur   Patient Active Problem List   Diagnosis Date Noted   Migraine without aura and without status migrainosus, not intractable 10/28/2021   Paresthesia 03/23/2017   Lateral epicondylitis of right elbow 01/22/2015   Shoulder pain    Pain in joint, shoulder region 05/04/2012   Other nonspecific abnormal result of function study of brain and central nervous system 05/04/2012   Pain in limb 05/04/2012   Cervical disc disorder with radiculopathy of cervical region 05/04/2012   Migraine headache 05/04/2012   Right foot pain 05/06/2011    PCP:  Haywood Pao, MD   REFERRING PROVIDER:  Leandrew Koyanagi, MD   REFERRING DIAG: [phone] (ICD-10-CM) - Chronic pain of right knee  THERAPY DIAG:  Chronic pain of right knee  Muscle weakness (generalized)  Difficulty in walking, not elsewhere classified  Localized edema  Rationale for Evaluation and Treatment: Rehabilitation  ONSET DATE: 06/03/23  SUBJECTIVE:   SUBJECTIVE STATEMENT: She indicated doing the exercises several times and feeling some complaints after exercise but not during.  She indicated she didn't feel any better or worse from last.     PERTINENT HISTORY: Anemia, anxiety, migraines, neuropathy, shoulder pain, IBS, brachial neuritis PAIN:  NPRS scale: 2/10 Pain location: right knee Pain description: stabbing pain on medial inferior patella Aggravating factors: kneeling down to clean cabinets Relieving factors: prednisone  PRECAUTIONS: None  WEIGHT BEARING RESTRICTIONS: No  FALLS:  Has patient fallen in last 6 months? No  LIVING ENVIRONMENT: Lives with: lives with their family and lives with their spouse Lives in: House/apartment Stairs: Yes: Internal: flight steps; on left going up Has following equipment at home: None  OCCUPATION: not working  PLOF: Independent  PATIENT GOALS: play tennis   OBJECTIVE:   DIAGNOSTIC FINDINGS:  08/31/2022 Per MD note: Very small medial meniscal tear in the posterior horn which does not seem to explain  her symptoms.  There is also mention of medial patellofemoral facet chondromalacia with some underlying subchondral edema.   PATIENT SURVEYS:  08/31/22: FOTO intake:   61%   COGNITION: 08/31/2022 Overall cognitive status: WFL    SENSATION: 08/31/2022 WFL  EDEMA:  08/31/22: Circumferential: 6 centimeters above superior patella Rt: 51 centimeters Left: 47.5 centimeters  MUSCLE LENGTH: 08/31/2022 Hamstrings: Right 95 deg; Left 100 deg   POSTURE:  08/31/2022 rounded shoulders and forward  head  PALPATION: 08/31/22: tenderness along medial joint line  LOWER EXTREMITY ROM:   ROM:  A= active P= passive Right 08/31/22 Left 08/31/22  Hip flexion 126 130  Hip extension    Hip abduction    Hip adduction    Hip internal rotation    Hip external rotation    Knee flexion A: 130 P: 134 With pain A: 146  Knee extension A: 0  A: 0  Ankle dorsiflexion    Ankle plantarflexion    Ankle inversion    Ankle eversion     (Blank rows = not tested)  LOWER EXTREMITY MMT:  MMT Right 08/31/22 Left 08/31/22 Right 09/02/2022 Left 09/02/2022  Hip flexion 5 5    Hip extension      Hip abduction      Hip adduction      Hip internal rotation 4+ 5    Hip external rotation 4+ 5    Knee flexion 4+ 5    Knee extension 4+ 5 48 lbs, 49 lbs 52.7 lbs, 48.2 lbs  Ankle dorsiflexion      Ankle plantarflexion      Ankle inversion      Ankle eversion       (Blank rows = not tested)  LOWER EXTREMITY SPECIAL TESTS:  08/31/22: Knee special tests: Lachman Test: negative and Patellafemoral apprehension test: negative  FUNCTIONAL TESTS:  08/31/22:  5 time sit to stand 9 sec no UE support  GAIT: 08/31/2022 Distance walked: 30 feet  Assistive device utilized: None Level of assistance: Complete Independence Comments: normalize gait pattern   TODAY'S TREATMENT                                                               DATE:09/02/2022 Therex: Review of existing HEP c verbal cues for adjustments of squats to avoid anterior knee displacement over toes toa void pain increase for now.  Supine Rt thomas stretch 30 sec x 3  Prone quad stretch c strap and towel under knee 30 sec x 2 Supine Rt SLR c ER x 3 Seated Rt SLR x 15 Leg press single leg 50 lbs 2 x 15 on Rt  Manual: Compression to distal Rt quad medially  Trigger Point Dry-Needling  Treatment instructions: Expect mild to moderate muscle soreness. S/S of pneumothorax if dry needled over a lung field, and to seek immediate medical attention should  they occur. Patient verbalized understanding of these instructions and education.  Patient Consent Given: Yes Education handout provided: Yes Muscles treated: distal vastus medialis  Treatment response/outcome: twitch response noted, not concordant symptoms  TODAY'S TREATMENT  DATE:08/31/2022 Therex: HEP instruction/performance c cues for techniques, handout provided.  Trial set performed of each for comprehension and symptom assessment.  See below for exercise list  Trigger Point Dry-Needling  Treatment instructions: Expect mild to moderate muscle soreness. S/S of pneumothorax if dry needled over a lung field, and to seek immediate medical attention should they occur. Patient verbalized understanding of these instructions and education.  Patient Consent Given: Yes Education handout provided: Yes Muscles treated: Vastus intermedius and vastus medialis Treatment response/outcome: twitch response noted   PATIENT EDUCATION:  09/02/2022 Education details: HEP update Person educated: Patient Education method: Consulting civil engineer, Demonstration, Verbal cues, and Handouts Education comprehension: verbalized understanding, returned demonstration, and verbal cues required  HOME EXERCISE PROGRAM: Access Code: [email or web address] URL: [email or web address] Date: 09/02/2022 Prepared by: Scot Jun  Exercises - Straight Leg Raise with External Rotation  - 2 x daily - 7 x weekly - 2 sets - 10 reps - Prone Hip Internal and External Rotation AROM  - 2 x daily - 7 x weekly - 5 reps - Bridge with Hip Abduction and Resistance  - 2 x daily - 7 x weekly - 2 sets - 10 reps - Seated Straight Leg Heel Taps  - 1-2 x daily - 7 x weekly - 3 sets - 10 reps - Advertising account planner with Strap (Mirrored)  - 1-2 x daily - 7 x weekly - 1 sets - 5 reps - 30 hold - Prone Quad Stretch with Towel Roll and Strap  - 1 x daily - 7 x weekly - 1 sets - 3-5 reps - 30  hold - Sit to Stand  - 3 x daily - 7 x weekly - 1 sets - 10 reps  ASSESSMENT:  CLINICAL IMPRESSION: Adjustment of HEP to attempt to avoid pain response afterwards.  Qualitative difficulty increase in Rt leg quad activation/slr fatigue compared to Lt.  Recommend continued strengthening program and dry needling as needed to improve performance and symptoms.    OBJECTIVE IMPAIRMENTS: decreased balance, decreased mobility, difficulty walking, decreased ROM, decreased strength, increased edema, impaired flexibility, postural dysfunction, and pain.   ACTIVITY LIMITATIONS: bending, standing, squatting, sleeping, and stairs  PARTICIPATION LIMITATIONS: community activity and occupation  PERSONAL FACTORS: 1-2 comorbidities: see above pertinent history  are also affecting patient's functional outcome.   REHAB POTENTIAL: Good  CLINICAL DECISION MAKING: Stable/uncomplicated  EVALUATION COMPLEXITY: Low   GOALS: Goals reviewed with patient? Yes  SHORT TERM GOALS: (target date for Short term goals are 3 weeks 09/25/22)   1.  Patient will demonstrate independent use of home exercise program to maintain progress from in clinic treatments.  Goal status: on going 09/02/2022  LONG TERM GOALS: (target dates for all long term goals are 10 weeks  11/13/22 )   1. Patient will demonstrate/report pain at worst less than or equal to 2/10 to facilitate minimal limitation in daily activity secondary to pain symptoms.  Goal status: New   2. Patient will demonstrate independent use of home exercise program to facilitate ability to maintain/progress functional gains from skilled physical therapy services.  Goal status: New   3. Patient will demonstrate FOTO outcome > or = 77% to indicate reduced disability due to condition.  Goal status: New   4.  Patient will demonstrate right LE MMT 5/5 throughout to faciltiate usual transfers, stairs, squatting at [facility] for daily life.   Goal status: New   5.   Patient will demonstrate 1 flight stair navigation with no pain using single hand rail.  Goal  status: New   6.  Pt will be able to demonstrate full squat and kneel with no Rt quad pain noted for improvements in functional mobility.  Goal status: New      PLAN:  PT FREQUENCY: 1-2x/week  PT DURATION: 10 weeks  PLANNED INTERVENTIONS: Therapeutic exercises, Therapeutic activity, Neuro Muscular re-education, Balance training, Gait training, Patient/Family education, Joint mobilization, Stair training, DME instructions, Dry Needling, Electrical stimulation, Traction, Cryotherapy, vasopneumatic deviceMoist heat, Taping, Ultrasound, Ionotophoresis '4mg'$ /ml Dexamethasone, and Manual therapy.  All included unless contraindicated  PLAN FOR NEXT SESSION:  Progressive strengthening, WB strengthening as tolerated     Scot Jun, PT, DPT, OCS, ATC 09/02/22  10:19 AM

## 2022-09-07 ENCOUNTER — Ambulatory Visit: Payer: BC Managed Care – PPO | Admitting: Rehabilitative and Restorative Service Providers"

## 2022-09-07 ENCOUNTER — Encounter: Payer: Self-pay | Admitting: Rehabilitative and Restorative Service Providers"

## 2022-09-07 DIAGNOSIS — R600 Localized edema: Secondary | ICD-10-CM | POA: Diagnosis not present

## 2022-09-07 DIAGNOSIS — R262 Difficulty in walking, not elsewhere classified: Secondary | ICD-10-CM

## 2022-09-07 DIAGNOSIS — M6281 Muscle weakness (generalized): Secondary | ICD-10-CM

## 2022-09-07 DIAGNOSIS — M25561 Pain in right knee: Secondary | ICD-10-CM | POA: Diagnosis not present

## 2022-09-07 DIAGNOSIS — G8929 Other chronic pain: Secondary | ICD-10-CM

## 2022-09-07 NOTE — Therapy (Addendum)
OUTPATIENT PHYSICAL THERAPY TREATMENT Discharge Summary   Patient Name: [name] MRN: [id number] DOB:06-30-1965, 57 y.o., female Today's Date: 09/07/2022  END OF SESSION:  PT End of Session - 09/07/22 1259     Visit Number 3    Number of Visits 18    Date for PT Re-Evaluation 11/13/22    PT Start Time 1259    PT Stop Time 1335    PT Time Calculation (min) 36 min    Activity Tolerance Patient tolerated treatment well    Behavior During Therapy WFL for tasks assessed/performed               Past Medical History:  Diagnosis Date   Anemia    Anxiety    Brachial neuritis or radiculitis NOS    COVID-19 virus infection    Endometriosis    H/O: hysterectomy    IBS (irritable bowel syndrome)    Migraine, unspecified, without mention of intractable migraine without mention of status migrainosus    Migraines    Neuropathy    Shoulder pain    Past Surgical History:  Procedure Laterality Date   ABDOMINAL HYSTERECTOMY     CHOLECYSTECTOMY     CYSTOSCOPY  11/17/2011   Procedure: CYSTOSCOPY;  Surgeon: Juluis Mire, MD;  Location: [facility];  Service: Gynecology;;   DIAGNOSTIC LAPAROSCOPY     LAPAROSCOPY  11/17/2011   Procedure: LAPAROSCOPY OPERATIVE;  Surgeon: Juluis Mire, MD;  Location: [facility];  Service: Gynecology;  Laterality: N/A;  with Injection of Vaginal Cuff with Dexamethasone mixed in 0.5% Bupivacaine   NASAL SEPTUM SURGERY     bone spur   Patient Active Problem List   Diagnosis Date Noted   Migraine without aura and without status migrainosus, not intractable 10/28/2021   Paresthesia 03/23/2017   Lateral epicondylitis of right elbow 01/22/2015   Shoulder pain    Pain in joint, shoulder region 05/04/2012   Other nonspecific abnormal result of function study of brain and central nervous system 05/04/2012   Pain in limb 05/04/2012   Cervical disc disorder with radiculopathy of cervical region 05/04/2012   Migraine headache 05/04/2012   Right foot pain  05/06/2011    PCP: Gaspar Garbe, MD   REFERRING PROVIDER:  Tarry Kos, MD   REFERRING DIAG: [phone] (ICD-10-CM) - Chronic pain of right knee  THERAPY DIAG:  Chronic pain of right knee  Muscle weakness (generalized)  Difficulty in walking, not elsewhere classified  Localized edema  Rationale for Evaluation and Treatment: Rehabilitation  ONSET DATE: 06/03/23  SUBJECTIVE:   SUBJECTIVE STATEMENT: She indicated having a little worse severity of symptoms in time since last.  Pt indicated prednisone is out of system now.      PERTINENT HISTORY: Anemia, anxiety, migraines, neuropathy, shoulder pain, IBS, brachial neuritis PAIN:  NPRS scale: 3-4/10 at rest Pain location: right knee (medial and inferior) Pain description: stabbing pain on medial inferior patella Aggravating factors: kneeling down to clean cabinets, walking, tennis Relieving factors: prednisone  PRECAUTIONS: None  WEIGHT BEARING RESTRICTIONS: No  FALLS:  Has patient fallen in last 6 months? No  LIVING ENVIRONMENT: Lives with: lives with their family and lives with their spouse Lives in: House/apartment Stairs: Yes: Internal: flight steps; on left going up Has following equipment at home: None  OCCUPATION: not working  PLOF: Independent  PATIENT GOALS: play tennis   OBJECTIVE:   DIAGNOSTIC FINDINGS:  08/31/2022 Per MD note: Very small medial meniscal tear in the posterior horn which  does not seem to explain her symptoms.  There is also mention of medial patellofemoral facet chondromalacia with some underlying subchondral edema.   PATIENT SURVEYS:  08/31/22: FOTO intake:   61%   COGNITION: 08/31/2022 Overall cognitive status: WFL    SENSATION: 08/31/2022 WFL  EDEMA:  08/31/22: Circumferential: 6 centimeters above superior patella Rt: 51 centimeters Left: 47.5 centimeters  MUSCLE LENGTH: 08/31/2022 Hamstrings: Right 95 deg; Left 100 deg   POSTURE:  08/31/2022 rounded shoulders  and forward head  PALPATION: 09/07/2022: specific tenderness along medial joint line   08/31/22: tenderness along medial joint line  LOWER EXTREMITY ROM:   ROM:  A= active P= passive Right 08/31/22 Left 08/31/22  Hip flexion 126 130  Hip extension    Hip abduction    Hip adduction    Hip internal rotation    Hip external rotation    Knee flexion A: 130 P: 134 With pain A: 146  Knee extension A: 0  A: 0  Ankle dorsiflexion    Ankle plantarflexion    Ankle inversion    Ankle eversion     (Blank rows = not tested)  LOWER EXTREMITY MMT:  MMT Right 08/31/22 Left 08/31/22 Right 09/02/2022 Left 09/02/2022  Hip flexion 5 5    Hip extension      Hip abduction      Hip adduction      Hip internal rotation 4+ 5    Hip external rotation 4+ 5    Knee flexion 4+ 5    Knee extension 4+ 5 48 lbs, 49 lbs 52.7 lbs, 48.2 lbs  Ankle dorsiflexion      Ankle plantarflexion      Ankle inversion      Ankle eversion       (Blank rows = not tested)  LOWER EXTREMITY SPECIAL TESTS:  09/07/2022: + Mcmurray's, Thessaly for medial joint pain.   08/31/22: Knee special tests: Lachman Test: negative and Patellafemoral apprehension test: negative  FUNCTIONAL TESTS:  08/31/22:  5 time sit to stand 9 sec no UE support  GAIT: 08/31/2022 Distance walked: 30 feet  Assistive device utilized: None Level of assistance: Complete Independence Comments: normalize gait pattern   TODAY'S TREATMENT                                                               DATE:09/02/2022 Therex: Review of HEP c adjustments to HEP to avoid exacerbation of complaints (advised stopping SLR c ER and anything c tibial ER in movement).  Time spent with verbal cues and visual demonstration of several interventions.  Detailed continued use and adaptations of quad strengthening (SLR, leg press) as well as aerobic interventions as tolerated.  Education given on pain monitoring scale with handout provided to help manage symptoms during  interventions.   Additional time spent in POC review and recommendations for return to MD while continuing HEP at home.    TODAY'S TREATMENT  DATE:08/31/2022 Therex: HEP instruction/performance c cues for techniques, handout provided.  Trial set performed of each for comprehension and symptom assessment.  See below for exercise list  Trigger Point Dry-Needling  Treatment instructions: Expect mild to moderate muscle soreness. S/S of pneumothorax if dry needled over a lung field, and to seek immediate medical attention should they occur. Patient verbalized understanding of these instructions and education.  Patient Consent Given: Yes Education handout provided: Yes Muscles treated: Vastus intermedius and vastus medialis Treatment response/outcome: twitch response noted   PATIENT EDUCATION:  09/02/2022 Education details: HEP update Person educated: Patient Education method: Programmer, multimedia, Demonstration, Verbal cues, and Handouts Education comprehension: verbalized understanding, returned demonstration, and verbal cues required  HOME EXERCISE PROGRAM: Access Code: [email or web address] URL: [email or web address] Date: 09/02/2022 Prepared by: Chyrel Masson  Exercises - Straight Leg Raise with External Rotation  - 2 x daily - 7 x weekly - 2 sets - 10 reps - Prone Hip Internal and External Rotation AROM  - 2 x daily - 7 x weekly - 5 reps - Bridge with Hip Abduction and Resistance  - 2 x daily - 7 x weekly - 2 sets - 10 reps - Seated Straight Leg Heel Taps  - 1-2 x daily - 7 x weekly - 3 sets - 10 reps - Energy manager with Strap (Mirrored)  - 1-2 x daily - 7 x weekly - 1 sets - 5 reps - 30 hold - Prone Quad Stretch with Towel Roll and Strap  - 1 x daily - 7 x weekly - 1 sets - 3-5 reps - 30 hold - Sit to Stand  - 3 x daily - 7 x weekly - 1 sets - 10 reps  ASSESSMENT:  CLINICAL IMPRESSION: Due to presentation and reports of  similar or slightly worsened symptoms in WB activity due to Rt medial joint line pain, recommendation was given to return to MD for followup with continued use of HEP for strengthening without pain increase.  Summary of findings was Rt medial joint line pain, noted in WB and c compression/ER of tibia with positive meniscal loading testing that supplements recent MRI findings.  Recommended return to MD for follow up on level of involvement of evident meniscal tear as well as any interventional changes due to overall lack of improvement.  Of note, comparison of seated quad strength testing c dynamometry produced similar comparison between legs.    OBJECTIVE IMPAIRMENTS: decreased balance, decreased mobility, difficulty walking, decreased ROM, decreased strength, increased edema, impaired flexibility, postural dysfunction, and pain.   ACTIVITY LIMITATIONS: bending, standing, squatting, sleeping, and stairs  PARTICIPATION LIMITATIONS: community activity and occupation  PERSONAL FACTORS: 1-2 comorbidities: see above pertinent history  are also affecting patient's functional outcome.   REHAB POTENTIAL: Good  CLINICAL DECISION MAKING: Stable/uncomplicated  EVALUATION COMPLEXITY: Low   GOALS: Goals reviewed with patient? Yes  SHORT TERM GOALS: (target date for Short term goals are 3 weeks 09/25/22)   1.  Patient will demonstrate independent use of home exercise program to maintain progress from in clinic treatments.  Goal status: Met  LONG TERM GOALS: (target dates for all long term goals are 10 weeks  11/13/22 )   1. Patient will demonstrate/report pain at worst less than or equal to 2/10 to facilitate minimal limitation in daily activity secondary to pain symptoms.  Goal status: New   2. Patient will demonstrate independent use of home exercise program to facilitate ability to maintain/progress functional gains from skilled physical therapy services.  Goal status: New  3. Patient will  demonstrate FOTO outcome > or = 77% to indicate reduced disability due to condition.  Goal status: New   4.  Patient will demonstrate right LE MMT 5/5 throughout to faciltiate usual transfers, stairs, squatting at [facility] for daily life.   Goal status: New   5.  Patient will demonstrate 1 flight stair navigation with no pain using single hand rail.  Goal status: New   6.  Pt will be able to demonstrate full squat and kneel with no Rt quad pain noted for improvements in functional mobility.  Goal status: New      PLAN:  PT FREQUENCY: 1-2x/week  PT DURATION: 10 weeks  PLANNED INTERVENTIONS: Therapeutic exercises, Therapeutic activity, Neuro Muscular re-education, Balance training, Gait training, Patient/Family education, Joint mobilization, Stair training, DME instructions, Dry Needling, Electrical stimulation, Traction, Cryotherapy, vasopneumatic deviceMoist heat, Taping, Ultrasound, Ionotophoresis 4mg /ml Dexamethasone, and Manual therapy.  All included unless contraindicated  PLAN FOR NEXT SESSION:  Hold PT and follow up with MD.     Chyrel Masson, PT, DPT, OCS, ATC 09/07/22  1:48 PM   PHYSICAL THERAPY DISCHARGE SUMMARY  Visits from Start of Care: 3  Current functional level related to goals / functional outcomes: See above   Remaining deficits: See above   Education / Equipment: HEP   Patient agrees to discharge. Patient goals were not met. Patient is being discharged due to meeting the stated rehab goals.  Narda Amber, PT, MPT 10/27/22 2:58 PM

## 2022-09-08 ENCOUNTER — Ambulatory Visit: Payer: BC Managed Care – PPO | Admitting: Orthopaedic Surgery

## 2022-09-08 DIAGNOSIS — S83241A Other tear of medial meniscus, current injury, right knee, initial encounter: Secondary | ICD-10-CM | POA: Diagnosis not present

## 2022-09-08 MED ORDER — PREDNISONE 10 MG (21) PO TBPK: ORAL_TABLET | ORAL | 3 refills | Status: DC

## 2022-09-08 NOTE — Progress Notes (Signed)
Office Visit Note   Patient: [name]           Date of Birth: September 08, 1965           MRN: [id number] Visit Date: 09/08/2022              Requested by: [name],  [place] [id number] PCP: [name]   Assessment & Plan: Visit Diagnoses:  1. Acute medial meniscus tear of right knee, initial encounter     Plan: Based on lack of response from physical therapy and conservative treatments I feel that the meniscus tear is more symptomatic than I originally thought.  She is reporting mechanical symptoms.  Reactive joint effusion is present.  At this point I recommended right knee arthroscopy and partial medial meniscectomy and chondroplasty as indicated.  She would like to have this done once she gets back from [place] in April.  In the meantime I have refilled the oral prednisone that she can take as needed for flareups.  [name] will call the patient to confirm surgery date.   Follow-Up Instructions: No follow-ups on file.   Orders:  No orders of the defined types were placed in this encounter.  Meds ordered this encounter  Medications   predniSONE (STERAPRED UNI-PAK 21 TAB) 10 MG (21) TBPK tablet    Sig: Take as directed    Dispense:  21 tablet    Refill:  3      Procedures: No procedures performed   Clinical Data: No additional findings.   Subjective: Chief Complaint  Patient presents with   Right Knee - Pain    HPI  [name] returns today for continued right knee pain.  She has been to 3 PT sessions which have not helped.  Oral prednisone has helped temporarily but the pain returns immediately after activity.    Review of Systems   Objective: Vital Signs: There were no vitals taken for this visit.  Physical Exam  Ortho Exam  Examination right knee shows joint effusion.  Medial joint line tenderness.  Pain with McMurray's test.  Range of motion preserved.  Specialty Comments:  No specialty comments  available.  Imaging: No results found.   PMFS History: Patient Active Problem List   Diagnosis Date Noted   Migraine without aura and without status migrainosus, not intractable 10/28/2021   Paresthesia 03/23/2017   Lateral epicondylitis of right elbow 01/22/2015   Shoulder pain    Pain in joint, shoulder region 05/04/2012   Other nonspecific abnormal result of function study of brain and central nervous system 05/04/2012   Pain in limb 05/04/2012   Cervical disc disorder with radiculopathy of cervical region 05/04/2012   Migraine headache 05/04/2012   Right foot pain 05/06/2011   Past Medical History:  Diagnosis Date   Anemia    Anxiety    Brachial neuritis or radiculitis NOS    COVID-19 virus infection    Endometriosis    H/O: hysterectomy    IBS (irritable bowel syndrome)    Migraine, unspecified, without mention of intractable migraine without mention of status migrainosus    Migraines    Neuropathy    Shoulder pain     Family History  Problem Relation Age of Onset   Diabetes Mother        both sides grandparents   Hypertension Mother    Colon polyps Mother    Migraines Mother    Thyroid disease Father  Colon polyps Father    Migraines Maternal Grandmother    Migraines Son    Migraines Daughter    Colon cancer Neg Hx    Stomach cancer Neg Hx    Esophageal cancer Neg Hx    Prostate cancer Neg Hx    Rectal cancer Neg Hx     Past Surgical History:  Procedure Laterality Date   ABDOMINAL HYSTERECTOMY     CHOLECYSTECTOMY     CYSTOSCOPY  11/17/2011   Procedure: CYSTOSCOPY;  Surgeon: Darlyn Chamber, Jensen;  Location: [facility];  Service: Gynecology;;   DIAGNOSTIC LAPAROSCOPY     LAPAROSCOPY  11/17/2011   Procedure: LAPAROSCOPY OPERATIVE;  Surgeon: Darlyn Chamber, Jensen;  Location: [facility];  Service: Gynecology;  Laterality: N/A;  with Injection of Vaginal Cuff with Dexamethasone mixed in 0.5% Bupivacaine   NASAL SEPTUM SURGERY     bone spur   Social History    Occupational History   Occupation: stay at home  Tobacco Use   Smoking status: Former   Smokeless tobacco: Never  Scientific laboratory technician Use: Never used  Substance and Sexual Activity   Alcohol use: Yes    Comment: social   Drug use: No   Sexual activity: Yes    Birth control/protection: Surgical

## 2022-09-11 ENCOUNTER — Encounter: Payer: BC Managed Care – PPO | Admitting: Rehabilitative and Restorative Service Providers"

## 2022-09-14 ENCOUNTER — Encounter: Payer: BC Managed Care – PPO | Admitting: Rehabilitative and Restorative Service Providers"

## 2022-09-22 ENCOUNTER — Encounter: Payer: BC Managed Care – PPO | Admitting: Rehabilitative and Restorative Service Providers"

## 2022-10-15 ENCOUNTER — Other Ambulatory Visit: Payer: Self-pay | Admitting: Physician Assistant

## 2022-10-15 MED ORDER — HYDROCODONE-ACETAMINOPHEN 5-325 MG PO TABS: 1.0000 | ORAL_TABLET | Freq: Three times a day (TID) | ORAL | 0 refills | Status: DC | PRN

## 2022-10-15 MED ORDER — ONDANSETRON HCL 4 MG PO TABS: 4.0000 mg | ORAL_TABLET | Freq: Three times a day (TID) | ORAL | 0 refills | Status: DC | PRN

## 2022-10-22 ENCOUNTER — Encounter: Payer: Self-pay | Admitting: Orthopaedic Surgery

## 2022-10-22 DIAGNOSIS — M94261 Chondromalacia, right knee: Secondary | ICD-10-CM | POA: Diagnosis not present

## 2022-10-22 DIAGNOSIS — S83231A Complex tear of medial meniscus, current injury, right knee, initial encounter: Secondary | ICD-10-CM | POA: Diagnosis not present

## 2022-10-22 DIAGNOSIS — M23303 Other meniscus derangements, unspecified medial meniscus, right knee: Secondary | ICD-10-CM | POA: Diagnosis not present

## 2022-10-26 ENCOUNTER — Other Ambulatory Visit: Payer: Self-pay | Admitting: Neurology

## 2022-10-29 ENCOUNTER — Ambulatory Visit (INDEPENDENT_AMBULATORY_CARE_PROVIDER_SITE_OTHER): Payer: BC Managed Care – PPO | Admitting: Orthopaedic Surgery

## 2022-10-29 DIAGNOSIS — S83241A Other tear of medial meniscus, current injury, right knee, initial encounter: Secondary | ICD-10-CM

## 2022-10-29 DIAGNOSIS — Z9889 Other specified postprocedural states: Secondary | ICD-10-CM

## 2022-10-29 NOTE — Progress Notes (Signed)
Post-Op Visit Note   Patient: [name]           Date of Birth: 07-Aug-1965           MRN: [id number] Visit Date: 10/29/2022 PCP: Gaspar Garbe, MD   Assessment & Plan:  Chief Complaint:  Chief Complaint  Patient presents with   Right Knee - Routine Post Op   Visit Diagnoses:  1. S/P arthroscopy of right knee   2. Acute medial meniscus tear of right knee, initial encounter     Plan: [name] is 1 week status post right knee arthroscopy partial medial meniscectomy.  She is doing well overall.  She is experiencing postsurgical symptoms.  No constitutional symptoms.  Examination of the right knee shows healed surgical incision.  Small residual joint effusion.  Range of motion is progressing nicely.  Sutures removed Steri-Strips applied.  Instructions reviewed.  She can steadily increase activity as tolerated.  Continue to alternate over-the-counter medications and ice as needed.  She will pick up a neoprene sleeve for support.  Recheck in 4 weeks.  Follow-Up Instructions: Return in about 4 weeks (around 11/26/2022).   Orders:  No orders of the defined types were placed in this encounter.  No orders of the defined types were placed in this encounter.   Imaging: No results found.  PMFS History: Patient Active Problem List   Diagnosis Date Noted   Migraine without aura and without status migrainosus, not intractable 10/28/2021   Paresthesia 03/23/2017   Lateral epicondylitis of right elbow 01/22/2015   Shoulder pain    Pain in joint, shoulder region 05/04/2012   Other nonspecific abnormal result of function study of brain and central nervous system 05/04/2012   Pain in limb 05/04/2012   Cervical disc disorder with radiculopathy of cervical region 05/04/2012   Migraine headache 05/04/2012   Right foot pain 05/06/2011   Past Medical History:  Diagnosis Date   Anemia    Anxiety    Brachial neuritis or radiculitis NOS    COVID-19 virus infection     Endometriosis    H/O: hysterectomy    IBS (irritable bowel syndrome)    Migraine, unspecified, without mention of intractable migraine without mention of status migrainosus    Migraines    Neuropathy    Shoulder pain     Family History  Problem Relation Age of Onset   Diabetes Mother        both sides grandparents   Hypertension Mother    Colon polyps Mother    Migraines Mother    Thyroid disease Father    Colon polyps Father    Migraines Maternal Grandmother    Migraines Son    Migraines Daughter    Colon cancer Neg Hx    Stomach cancer Neg Hx    Esophageal cancer Neg Hx    Prostate cancer Neg Hx    Rectal cancer Neg Hx     Past Surgical History:  Procedure Laterality Date   ABDOMINAL HYSTERECTOMY     CHOLECYSTECTOMY     CYSTOSCOPY  11/17/2011   Procedure: CYSTOSCOPY;  Surgeon: Juluis Mire, MD;  Location: [facility];  Service: Gynecology;;   DIAGNOSTIC LAPAROSCOPY     LAPAROSCOPY  11/17/2011   Procedure: LAPAROSCOPY OPERATIVE;  Surgeon: Juluis Mire, MD;  Location: [facility];  Service: Gynecology;  Laterality: N/A;  with Injection of Vaginal Cuff with Dexamethasone mixed in 0.5% Bupivacaine   NASAL SEPTUM SURGERY     bone spur  Social History   Occupational History   Occupation: stay at home  Tobacco Use   Smoking status: Former   Smokeless tobacco: Never  Building services engineer Use: Never used  Substance and Sexual Activity   Alcohol use: Yes    Comment: social   Drug use: No   Sexual activity: Yes    Birth control/protection: Surgical

## 2022-11-26 ENCOUNTER — Ambulatory Visit (INDEPENDENT_AMBULATORY_CARE_PROVIDER_SITE_OTHER): Payer: BC Managed Care – PPO | Admitting: Orthopaedic Surgery

## 2022-11-26 DIAGNOSIS — S83241A Other tear of medial meniscus, current injury, right knee, initial encounter: Secondary | ICD-10-CM

## 2022-11-26 DIAGNOSIS — Z9889 Other specified postprocedural states: Secondary | ICD-10-CM

## 2022-11-26 NOTE — Progress Notes (Signed)
Post-Op Visit Note   Patient: [name]           Date of Birth: 15-Sep-1965           MRN: [id number] Visit Date: 11/26/2022 PCP: Gaspar Garbe, MD   Assessment & Plan:  Chief Complaint:  Chief Complaint  Patient presents with   Right Knee - Routine Post Op   Visit Diagnoses:  1. S/P arthroscopy of right knee   2. Acute medial meniscus tear of right knee, initial encounter     Plan: [name] is a 57 year old female who is about 6 weeks status post right knee arthroscopy with partial medial meniscectomy.  She feels that things are getting better but she has apprehension with resuming activities such as tennis.  She is experiencing some difficulty with squatting.  Occasionally feels a giving way sensation.  Examination right knee shows fully healed surgical scars.  No joint effusion.  She has regained basically her normal range of motion.  No joint line tenderness.  In terms of recovery from the surgery [name] is doing very well.  I think she would benefit from physical therapy to help regain her strength and to improve her confidence in the use of her knee.  She can begin to hit with a ball machine.  At this point she can follow-up with [place] as needed  Follow-Up Instructions: No follow-ups on file.   Orders:  Orders Placed This Encounter  Procedures   Ambulatory referral to Physical Therapy   No orders of the defined types were placed in this encounter.   Imaging: No results found.  PMFS History: Patient Active Problem List   Diagnosis Date Noted   Migraine without aura and without status migrainosus, not intractable 10/28/2021   Paresthesia 03/23/2017   Lateral epicondylitis of right elbow 01/22/2015   Shoulder pain    Pain in joint, shoulder region 05/04/2012   Other nonspecific abnormal result of function study of brain and central nervous system 05/04/2012   Pain in limb 05/04/2012   Cervical disc disorder with radiculopathy of cervical  region 05/04/2012   Migraine headache 05/04/2012   Right foot pain 05/06/2011   Past Medical History:  Diagnosis Date   Anemia    Anxiety    Brachial neuritis or radiculitis NOS    COVID-19 virus infection    Endometriosis    H/O: hysterectomy    IBS (irritable bowel syndrome)    Migraine, unspecified, without mention of intractable migraine without mention of status migrainosus    Migraines    Neuropathy    Shoulder pain     Family History  Problem Relation Age of Onset   Diabetes Mother        both sides grandparents   Hypertension Mother    Colon polyps Mother    Migraines Mother    Thyroid disease Father    Colon polyps Father    Migraines Maternal Grandmother    Migraines Son    Migraines Daughter    Colon cancer Neg Hx    Stomach cancer Neg Hx    Esophageal cancer Neg Hx    Prostate cancer Neg Hx    Rectal cancer Neg Hx     Past Surgical History:  Procedure Laterality Date   ABDOMINAL HYSTERECTOMY     CHOLECYSTECTOMY     CYSTOSCOPY  11/17/2011   Procedure: CYSTOSCOPY;  Surgeon: Juluis Mire, MD;  Location: [facility];  Service: Gynecology;;   DIAGNOSTIC LAPAROSCOPY  LAPAROSCOPY  11/17/2011   Procedure: LAPAROSCOPY OPERATIVE;  Surgeon: Juluis Mire, MD;  Location: [facility];  Service: Gynecology;  Laterality: N/A;  with Injection of Vaginal Cuff with Dexamethasone mixed in 0.5% Bupivacaine   NASAL SEPTUM SURGERY     bone spur   Social History   Occupational History   Occupation: stay at home  Tobacco Use   Smoking status: Former   Smokeless tobacco: Never  Building services engineer Use: Never used  Substance and Sexual Activity   Alcohol use: Yes    Comment: social   Drug use: No   Sexual activity: Yes    Birth control/protection: Surgical

## 2022-11-30 ENCOUNTER — Ambulatory Visit: Payer: BC Managed Care – PPO | Admitting: Rehabilitative and Restorative Service Providers"

## 2022-11-30 ENCOUNTER — Encounter: Payer: Self-pay | Admitting: Rehabilitative and Restorative Service Providers"

## 2022-11-30 DIAGNOSIS — R262 Difficulty in walking, not elsewhere classified: Secondary | ICD-10-CM | POA: Diagnosis not present

## 2022-11-30 DIAGNOSIS — M25661 Stiffness of right knee, not elsewhere classified: Secondary | ICD-10-CM

## 2022-11-30 DIAGNOSIS — R600 Localized edema: Secondary | ICD-10-CM | POA: Diagnosis not present

## 2022-11-30 DIAGNOSIS — M25561 Pain in right knee: Secondary | ICD-10-CM

## 2022-11-30 NOTE — Therapy (Signed)
OUTPATIENT PHYSICAL THERAPY LOWER EXTREMITY EVALUATION   Patient Name: [name] MRN: [id number] DOB:1965-10-01, 57 y.o., female Today's Date: 11/30/2022  END OF SESSION:  PT End of Session - 11/30/22 1519     Visit Number 1    Number of Visits 10    Date for PT Re-Evaluation 01/25/23    Progress Note Due on Visit 10    PT Start Time 1346    PT Stop Time 1430    PT Time Calculation (min) 44 min    Activity Tolerance Patient tolerated treatment well;No increased pain    Behavior During Therapy WFL for tasks assessed/performed             Past Medical History:  Diagnosis Date   Anemia    Anxiety    Brachial neuritis or radiculitis NOS    COVID-19 virus infection    Endometriosis    H/O: hysterectomy    IBS (irritable bowel syndrome)    Migraine, unspecified, without mention of intractable migraine without mention of status migrainosus    Migraines    Neuropathy    Shoulder pain    Past Surgical History:  Procedure Laterality Date   ABDOMINAL HYSTERECTOMY     CHOLECYSTECTOMY     CYSTOSCOPY  11/17/2011   Procedure: CYSTOSCOPY;  Surgeon: Juluis Mire, MD;  Location: [facility];  Service: Gynecology;;   DIAGNOSTIC LAPAROSCOPY     LAPAROSCOPY  11/17/2011   Procedure: LAPAROSCOPY OPERATIVE;  Surgeon: Juluis Mire, MD;  Location: [facility];  Service: Gynecology;  Laterality: N/A;  with Injection of Vaginal Cuff with Dexamethasone mixed in 0.5% Bupivacaine   NASAL SEPTUM SURGERY     bone spur   Patient Active Problem List   Diagnosis Date Noted   Migraine without aura and without status migrainosus, not intractable 10/28/2021   Paresthesia 03/23/2017   Lateral epicondylitis of right elbow 01/22/2015   Shoulder pain    Pain in joint, shoulder region 05/04/2012   Other nonspecific abnormal result of function study of brain and central nervous system 05/04/2012   Pain in limb 05/04/2012   Cervical disc disorder with radiculopathy of cervical region 05/04/2012    Migraine headache 05/04/2012   Right foot pain 05/06/2011    PCP: Gaspar Garbe, MD  REFERRING PROVIDER: Tarry Kos, MD  REFERRING DIAG:  Diagnosis  [phone] (ICD-10-CM) - S/P arthroscopy of right knee  S83.241A (ICD-10-CM) - Acute medial meniscus tear of right knee, initial encounter  S/p right knee scope. Knee rehab. Gait training, strengthening.  THERAPY DIAG:  Difficulty in walking, not elsewhere classified - Plan: PT plan of care cert/re-cert  Localized edema - Plan: PT plan of care cert/re-cert  Stiffness of right knee, not elsewhere classified - Plan: PT plan of care cert/re-cert  Right knee pain, unspecified chronicity - Plan: PT plan of care cert/re-cert  Rationale for Evaluation and Treatment: Rehabilitation  ONSET DATE: December of 2023 with arthroscopy 10/22/2022  SUBJECTIVE:   SUBJECTIVE STATEMENT: [name] has had knee pain since December of last year.  Her right knee scope was 10/22/2022.  She has discomfort with stairs and has not returned to playing tennis multiple times per day.  PERTINENT HISTORY: Migraines, neuropathy PAIN:  Are you having pain? Yes: NPRS scale: 0-4/10, taking Advil as needed and using ice on a 10/10 Pain location: Inferior and medial Pain description: Achy, annoying Aggravating factors: Being in one position too long, prolonged WB, coming back up from a squat Relieving factors: Ice and  advil  PRECAUTIONS: None  WEIGHT BEARING RESTRICTIONS: No  FALLS:  Has patient fallen in last 6 months? No  LIVING ENVIRONMENT: Lives with: lives with their spouse Lives in: House/apartment Stairs: Can do but are difficult, particularly down Has following equipment at home: None  OCCUPATION: Not working  PLOF: Independent  PATIENT GOALS: Get back to tennis, be able to stand from squatting without pain  NEXT MD VISIT: NA  OBJECTIVE:   DIAGNOSTIC FINDINGS: IMPRESSION: 1. Small radial/free edge tear in the posterior horn  medial meniscus. 2. Moderate to prominent focal but smoothly marginated chondral thinning along the posterior patellar ridge and adjacent medial facet. Otherwise mild degenerative chondral thinning. 3. Small knee effusion. 4. Mild pes anserine bursitis.  PATIENT SURVEYS:  FOTO 69 (risk-adjusted 62, Goal 79 in 10 visits)   COGNITION:  Overall cognitive status: Within functional limits for tasks assessed     SENSATION: No tingling or numbness  LOWER EXTREMITY ROM:  Active ROM Left/Right 11/30/2022   Hip flexion    Hip extension    Hip abduction    Hip adduction    Hip internal rotation    Hip external rotation    Knee flexion 149/137   Knee extension 0/0   Ankle dorsiflexion    Ankle plantarflexion    Ankle inversion    Ankle eversion     (Blank rows = not tested)  LOWER EXTREMITY STRENGTH:  Thigh girth is cm assessed 15 cm proximal to the superior patellar pole Left/Right 11/30/2022   Hip flexion    Hip extension    Hip abduction    Hip adduction    Hip internal rotation    Hip external rotation    Knee flexion    Knee extension 59.0/58.5   Ankle dorsiflexion    Ankle plantarflexion    Ankle inversion    Ankle eversion     (Blank rows = not tested)  GAIT: Distance walked: 100 feet Assistive device utilized: None Level of assistance: Complete Independence Comments: Has not returned to tennis   TODAY'S TREATMENT:                                                                                                                              DATE: 11/30/2022 Seated straight leg raises 2 sets of 5 Standing heel to toe raises (no hands) 10 x 3 seconds  Neuromuscular re-education: Tandem balance eyes open; head turning; eyes closed 1 x 20 seconds each   PATIENT EDUCATION:  Education details: Reviewed exam findings and HEP Person educated: Patient Education method: Explanation, Demonstration, Tactile cues, Verbal cues, and Handouts Education comprehension:  verbalized understanding, returned demonstration, verbal cues required, tactile cues required, and needs further education  HOME EXERCISE PROGRAM: [id number]  ASSESSMENT:  CLINICAL IMPRESSION: Patient is a 57 y.o. female who was seen today for physical therapy evaluation and treatment for  Diagnosis  Z98.890 (ICD-10-CM) - S/P arthroscopy of right knee  S83.241A (ICD-10-CM) -  Acute medial meniscus tear of right knee, initial encounter  . Joycelin has surprisingly little right thigh atrophy as assessed 15 cm proximal to the superior pole patella.  She is very motivated to return to tennis and realizes she will need to rebuild strength and endurance before doing so.  She was started on a comprehensive home exercise program to address impairments noted during today's evaluation.  Her prognosis is good to meet the below listed goals.  OBJECTIVE IMPAIRMENTS: Abnormal gait, decreased activity tolerance, decreased endurance, decreased knowledge of condition, difficulty walking, decreased ROM, decreased strength, decreased safety awareness, increased edema, impaired perceived functional ability, and pain.   ACTIVITY LIMITATIONS: lifting, bending, sitting, standing, squatting, stairs, and locomotion level  PARTICIPATION LIMITATIONS: community activity  PERSONAL FACTORS:  Migraines and neuropathy  are also affecting patient's functional outcome.   REHAB POTENTIAL: Good  CLINICAL DECISION MAKING: Stable/uncomplicated  EVALUATION COMPLEXITY: Low   GOALS: Goals reviewed with patient? Yes  SHORT TERM GOALS: Target date: 12/28/2022 Improve right knee flexion active range of motion to 149 degrees Baseline: 137 degrees Goal status: INITIAL  2.  Lisa-Marie will be independent with her day 1 home exercise program Baseline: Started 11/30/2022 Goal status: INITIAL  3.  Yariana will be able to hit balls and run select tennis drills without any increases in right knee joint pain Baseline: Will be slowly starting  over the next 4 weeks Goal status: INITIAL   LONG TERM GOALS: Target date: 01/25/2023  Improve FOTO to 79 Baseline: 69, risk adjusted 62 Goal status: INITIAL  2.  Improve right knee pain to consistently 0-2 out of 10 on the numeric pain rating scale Baseline: 0-4 out of 10 Goal status: INITIAL  3.  Improve right quadriceps strength as assessed by equal thigh girth to the uninvolved left and to return to tennis without pain Baseline: 0.5 cm of right thigh atrophy and unable to participate in full tennis activities Goal status: INITIAL  4.  Totsie will be independent with her long-term home exercise program at discharge Baseline: Started 11/30/2022 Goal status: INITIAL   PLAN:  PT FREQUENCY: 1-2x/week  PT DURATION: 8 weeks  PLANNED INTERVENTIONS: Therapeutic exercises, Therapeutic activity, Neuromuscular re-education, Balance training, Gait training, Patient/Family education, Self Care, Stair training, Electrical stimulation, Cryotherapy, and Vasopneumatic device  PLAN FOR NEXT SESSION: Review home exercises from day 1.  Add appropriate quadriceps, hip abductors and general lower extremity strength activities.  Progress balance and appropriately add return to tennis activities.   Cherlyn Cushing, PT, MPT 11/30/2022, 3:38 PM

## 2022-12-07 ENCOUNTER — Encounter: Payer: Self-pay | Admitting: Rehabilitative and Restorative Service Providers"

## 2022-12-07 ENCOUNTER — Ambulatory Visit: Payer: BC Managed Care – PPO | Admitting: Rehabilitative and Restorative Service Providers"

## 2022-12-07 DIAGNOSIS — M25561 Pain in right knee: Secondary | ICD-10-CM

## 2022-12-07 DIAGNOSIS — R262 Difficulty in walking, not elsewhere classified: Secondary | ICD-10-CM

## 2022-12-07 DIAGNOSIS — M25661 Stiffness of right knee, not elsewhere classified: Secondary | ICD-10-CM

## 2022-12-07 DIAGNOSIS — R600 Localized edema: Secondary | ICD-10-CM

## 2022-12-07 NOTE — Therapy (Addendum)
OUTPATIENT PHYSICAL THERAPY TREATMENT   Patient Name: [name] MRN: [id number] DOB:01/29/1966, 57 y.o., female Today's Date: 12/07/2022  END OF SESSION:  PT End of Session - 12/07/22 1522     Visit Number 2    Number of Visits 10    Date for PT Re-Evaluation 01/25/23    Progress Note Due on Visit 10    PT Start Time 1515    PT Stop Time 1554    PT Time Calculation (min) 39 min    Activity Tolerance Patient tolerated treatment well    Behavior During Therapy WFL for tasks assessed/performed              Past Medical History:  Diagnosis Date   Anemia    Anxiety    Brachial neuritis or radiculitis NOS    COVID-19 virus infection    Endometriosis    H/O: hysterectomy    IBS (irritable bowel syndrome)    Migraine, unspecified, without mention of intractable migraine without mention of status migrainosus    Migraines    Neuropathy    Shoulder pain    Past Surgical History:  Procedure Laterality Date   ABDOMINAL HYSTERECTOMY     CHOLECYSTECTOMY     CYSTOSCOPY  11/17/2011   Procedure: CYSTOSCOPY;  Surgeon: Juluis Mire, MD;  Location: [facility];  Service: Gynecology;;   DIAGNOSTIC LAPAROSCOPY     LAPAROSCOPY  11/17/2011   Procedure: LAPAROSCOPY OPERATIVE;  Surgeon: Juluis Mire, MD;  Location: [facility];  Service: Gynecology;  Laterality: N/A;  with Injection of Vaginal Cuff with Dexamethasone mixed in 0.5% Bupivacaine   NASAL SEPTUM SURGERY     bone spur   Patient Active Problem List   Diagnosis Date Noted   Migraine without aura and without status migrainosus, not intractable 10/28/2021   Paresthesia 03/23/2017   Lateral epicondylitis of right elbow 01/22/2015   Shoulder pain    Pain in joint, shoulder region 05/04/2012   Other nonspecific abnormal result of function study of brain and central nervous system 05/04/2012   Pain in limb 05/04/2012   Cervical disc disorder with radiculopathy of cervical region 05/04/2012   Migraine headache 05/04/2012   Right  foot pain 05/06/2011    PCP: Gaspar Garbe, MD  REFERRING PROVIDER: Tarry Kos, MD  REFERRING DIAG:  Diagnosis  [phone] (ICD-10-CM) - S/P arthroscopy of right knee  S83.241A (ICD-10-CM) - Acute medial meniscus tear of right knee, initial encounter  S/p right knee scope. Knee rehab. Gait training, strengthening.  THERAPY DIAG:  Difficulty in walking, not elsewhere classified  Localized edema  Stiffness of right knee, not elsewhere classified  Right knee pain, unspecified chronicity  Rationale for Evaluation and Treatment: Rehabilitation  ONSET DATE: December of 2023 with arthroscopy 10/22/2022  SUBJECTIVE:   SUBJECTIVE STATEMENT: She indicated having 3-4/10 symptoms at worst in last 2 days.  Symptoms/difficulty c squats, going down stairs.  Pulling across knee camp was noted.   PERTINENT HISTORY: Migraines, neuropathy  PAIN:  NPRS scale:  Pain location: Inferior and medial Pain description: Achy, annoying Aggravating factors: Being in one position too long, prolonged WB, coming back up from a squat Relieving factors: Ice and advil  PRECAUTIONS: None  - surgery was partial medial menisectomy  WEIGHT BEARING RESTRICTIONS: No  FALLS:  Has patient fallen in last 6 months? No  LIVING ENVIRONMENT: Lives with: lives with their spouse Lives in: House/apartment Stairs: Can do but are difficult, particularly down Has following equipment at home: None  OCCUPATION: Not working  PLOF: Independent  PATIENT GOALS: Get back to tennis, be able to stand from squatting without pain   OBJECTIVE:   DIAGNOSTIC FINDINGS:  11/30/2022  review of chart:   IMPRESSION: 1. Small radial/free edge tear in the posterior horn medial meniscus. 2. Moderate to prominent focal but smoothly marginated chondral thinning along the posterior patellar ridge and adjacent medial facet. Otherwise mild degenerative chondral thinning. 3. Small knee effusion. 4. Mild pes anserine  bursitis.  PATIENT SURVEYS:  11/30/2022 FOTO 69 (risk-adjusted 62, Goal 79 in 10 visits)   COGNITION:  11/30/2022 Overall cognitive status: Within functional limits for tasks assessed     SENSATION: 11/30/2022 No tingling or numbness  LOWER EXTREMITY ROM:  Active ROM Left/Right 11/30/2022   Hip flexion    Hip extension    Hip abduction    Hip adduction    Hip internal rotation    Hip external rotation    Knee flexion 149/137   Knee extension 0/0   Ankle dorsiflexion    Ankle plantarflexion    Ankle inversion    Ankle eversion     (Blank rows = not tested)  LOWER EXTREMITY STRENGTH:  12/07/2022:  review from September 02, 2022 testing of knee extension with dynamometry sitting:  Rt 48.5 lb avg   Lt 50 lb avg   Left/Right 11/30/2022 Thigh girth is cm assessed 15 cm proximal to the superior patellar pole Right 12/07/2022 Left 12/07/2022  Hip flexion     Hip extension     Hip abduction     Hip adduction     Hip internal rotation     Hip external rotation     Knee flexion     Knee extension 59.0/58.5 5/5  37.2, 34.1 lbs 5/5  43.3, 44.1 lbs  Ankle dorsiflexion     Ankle plantarflexion     Ankle inversion     Ankle eversion      (Blank rows = not tested)  Functional Testing: 12/07/2022: Lt SLS: Rt SLS:   GAIT: 11/30/2022 Distance walked: 100 feet Assistive device utilized: None Level of assistance: Complete Independence Comments: Has not returned to tennis                      TODAY'S TREATMENT:                                                                       DATE: 12/07/2022 Therex: Recumbent bike lvl 3 5 mins for ROM Lateral step down 4 inch step WB 2 x 15 c blue band TKE use Leg press Rt leg 56 lbs 2 x 15 c slow lowering focus Trial of alternating heel/toe lifts c focus on minimal postural sway x 5 each Reviewed seated SLR verbally with demonstration.   Cues for HEP additions of banded TKE in WB, leg press at gym as tolerated as well as inclusion of existing  prior HEP pre surgery to most recent HEP. Additional time spent in education of techniques and adjustments based off symptom presentation.  Verbal review of previous exercises was performed     TODAY'S TREATMENT:  DATE: 11/30/2022 Seated straight leg raises 2 sets of 5 Standing heel to toe raises (no hands) 10 x 3 seconds  Neuromuscular re-education: Tandem balance eyes open; head turning; eyes closed 1 x 20 seconds each   PATIENT EDUCATION:  12/07/2022 Education details: HEP update Person educated: Patient Education method: Programmer, multimedia, Demonstration, Verbal cues, and Handouts Education comprehension: verbalized understanding, returned demonstration, and verbal cues required  HOME EXERCISE PROGRAM: Access Code: [email or web address] URL: [email or web address] Date: 12/07/2022 Prepared by: Chyrel Masson  Exercises - Straight Leg Raise with External Rotation  - 2 x daily - 7 x weekly - 2 sets - 10 reps - Bridge with Hip Abduction and Resistance  - 2 x daily - 7 x weekly - 2 sets - 10 reps - Seated Straight Leg Heel Taps  - 1-2 x daily - 7 x weekly - 2-3 sets - 10 reps - Energy manager with Strap (Mirrored)  - 1-2 x daily - 7 x weekly - 1 sets - 5 reps - 30 hold - Prone Quad Stretch with Towel Roll and Strap  - 1 x daily - 7 x weekly - 1 sets - 3-5 reps - 30 hold - Sit to Stand  - 3 x daily - 7 x weekly - 1 sets - 10 reps - Tandem Stance  - 1 x daily - 7 x weekly - 1 sets - 3-5 reps - 30 hold - Heel Toe Raises with Counter Support  - 1 x daily - 7 x weekly - 1-2 sets - 10 reps  Updated to include existing HEP from pre surgery.   Pt had access to this code on her app.   ASSESSMENT:  CLINICAL IMPRESSION: Comparison of quad strength today vs. In march showed drop in quad strength bilateral with Rt leg drop more noted than Lt.  Rt leg less than Lt in comparison today.   Progressive strengthening plan to help  improve WB acceptance and progress towards goals of agility based improvements for tennis activity.   OBJECTIVE IMPAIRMENTS: Abnormal gait, decreased activity tolerance, decreased endurance, decreased knowledge of condition, difficulty walking, decreased ROM, decreased strength, decreased safety awareness, increased edema, impaired perceived functional ability, and pain.   ACTIVITY LIMITATIONS: lifting, bending, sitting, standing, squatting, stairs, and locomotion level  PARTICIPATION LIMITATIONS: community activity  PERSONAL FACTORS:  Migraines and neuropathy  are also affecting patient's functional outcome.   REHAB POTENTIAL: Good  CLINICAL DECISION MAKING: Stable/uncomplicated  EVALUATION COMPLEXITY: Low   GOALS: Goals reviewed with patient? Yes  SHORT TERM GOALS: Target date: 12/28/2022 Improve right knee flexion active range of motion to 149 degrees Baseline: 137 degrees Goal status:  on going 12/07/2022  2.  [name] will be independent with her day 1 home exercise program Baseline: Started 11/30/2022 Goal status: on going 12/07/2022  3.  [name] will be able to hit balls and run select tennis drills without any increases in right knee joint pain Baseline: Will be slowly starting over the next 4 weeks Goal status: on going 12/07/2022   LONG TERM GOALS: Target date: 01/25/2023  Improve FOTO to 79 Baseline: 69, risk adjusted 62 Goal status: INITIAL  2.  Improve right knee pain to consistently 0-2 out of 10 on the numeric pain rating scale Baseline: 0-4 out of 10 Goal status: INITIAL  3.  Improve right quadriceps strength as assessed by equal thigh girth to the uninvolved left and to return to tennis without pain Baseline: 0.5 cm of right thigh atrophy and unable to participate in full  tennis activities Goal status: INITIAL  4.  [name] will be independent with her long-term home exercise program at discharge Baseline: Started 11/30/2022 Goal status: INITIAL   PLAN:  PT  FREQUENCY: 1-2x/week  PT DURATION: 8 weeks  PLANNED INTERVENTIONS: Therapeutic exercises, Therapeutic activity, Neuromuscular re-education, Balance training, Gait training, Patient/Family education, Self Care, Stair training, Electrical stimulation, Cryotherapy, and Vasopneumatic device  PLAN FOR NEXT SESSION: Progressive quad strengthening without pain increases.  Progress in balance intervention to compliant surface and dynamic stability as able.    Chyrel Masson, PT, DPT, OCS, ATC 12/07/22  3:58 PM

## 2022-12-16 ENCOUNTER — Encounter: Payer: BC Managed Care – PPO | Admitting: Rehabilitative and Restorative Service Providers"

## 2022-12-23 ENCOUNTER — Ambulatory Visit: Payer: BC Managed Care – PPO | Admitting: Rehabilitative and Restorative Service Providers"

## 2022-12-23 ENCOUNTER — Encounter: Payer: Self-pay | Admitting: Rehabilitative and Restorative Service Providers"

## 2022-12-23 DIAGNOSIS — M25661 Stiffness of right knee, not elsewhere classified: Secondary | ICD-10-CM

## 2022-12-23 DIAGNOSIS — R600 Localized edema: Secondary | ICD-10-CM | POA: Diagnosis not present

## 2022-12-23 DIAGNOSIS — R262 Difficulty in walking, not elsewhere classified: Secondary | ICD-10-CM | POA: Diagnosis not present

## 2022-12-23 DIAGNOSIS — M25561 Pain in right knee: Secondary | ICD-10-CM | POA: Diagnosis not present

## 2022-12-23 NOTE — Therapy (Addendum)
OUTPATIENT PHYSICAL THERAPY TREATMENT  /DISCHARGE   Patient Name: [name] MRN: [id number] DOB:07/09/1965, 57 y.o., female Today's Date: 12/23/2022  END OF SESSION:  PT End of Session - 12/23/22 1646     Visit Number 3    Number of Visits 10    Date for PT Re-Evaluation 01/25/23    Progress Note Due on Visit 10    PT Start Time 1600    PT Stop Time 1650    PT Time Calculation (min) 50 min    Activity Tolerance Patient tolerated treatment well;No increased pain;Patient limited by pain    Behavior During Therapy [facility] for tasks assessed/performed               Past Medical History:  Diagnosis Date   Anemia    Anxiety    Brachial neuritis or radiculitis NOS    COVID-19 virus infection    Endometriosis    H/O: hysterectomy    IBS (irritable bowel syndrome)    Migraine, unspecified, without mention of intractable migraine without mention of status migrainosus    Migraines    Neuropathy    Shoulder pain    Past Surgical History:  Procedure Laterality Date   ABDOMINAL HYSTERECTOMY     CHOLECYSTECTOMY     CYSTOSCOPY  11/17/2011   Procedure: CYSTOSCOPY;  Surgeon: Juluis Mire, MD;  Location: [facility];  Service: Gynecology;;   DIAGNOSTIC LAPAROSCOPY     LAPAROSCOPY  11/17/2011   Procedure: LAPAROSCOPY OPERATIVE;  Surgeon: Juluis Mire, MD;  Location: [facility];  Service: Gynecology;  Laterality: N/A;  with Injection of Vaginal Cuff with Dexamethasone mixed in 0.5% Bupivacaine   NASAL SEPTUM SURGERY     bone spur   Patient Active Problem List   Diagnosis Date Noted   Migraine without aura and without status migrainosus, not intractable 10/28/2021   Paresthesia 03/23/2017   Lateral epicondylitis of right elbow 01/22/2015   Shoulder pain    Pain in joint, shoulder region 05/04/2012   Other nonspecific abnormal result of function study of brain and central nervous system 05/04/2012   Pain in limb 05/04/2012   Cervical disc disorder with radiculopathy of cervical  region 05/04/2012   Migraine headache 05/04/2012   Right foot pain 05/06/2011    PCP: Gaspar Garbe, MD  REFERRING PROVIDER: Tarry Kos, MD  REFERRING DIAG:  Diagnosis  [phone] (ICD-10-CM) - S/P arthroscopy of right knee  S83.241A (ICD-10-CM) - Acute medial meniscus tear of right knee, initial encounter  S/p right knee scope. Knee rehab. Gait training, strengthening.  THERAPY DIAG:  Difficulty in walking, not elsewhere classified  Localized edema  Stiffness of right knee, not elsewhere classified  Right knee pain, unspecified chronicity  Rationale for Evaluation and Treatment: Rehabilitation  ONSET DATE: December of 2023 with arthroscopy 10/22/2022  SUBJECTIVE:   SUBJECTIVE STATEMENT: She indicated having 3-4/10 symptoms at worst in last 2 days.  Symptoms/difficulty c squats, going down stairs.  Pulling across knee camp was noted.   PERTINENT HISTORY: Migraines, neuropathy  PAIN:  NPRS scale:  Pain location: Inferior and medial Pain description: Achy, annoying Aggravating factors: Being in one position too long, prolonged WB, coming back up from a squat Relieving factors: Ice and advil  PRECAUTIONS: None  - surgery was partial medial menisectomy  WEIGHT BEARING RESTRICTIONS: No  FALLS:  Has patient fallen in last 6 months? No  LIVING ENVIRONMENT: Lives with: lives with their spouse Lives in: House/apartment Stairs: Can do but are difficult,  particularly down Has following equipment at home: None  OCCUPATION: Not working  PLOF: Independent  PATIENT GOALS: Get back to tennis, be able to stand from squatting without pain   OBJECTIVE:   DIAGNOSTIC FINDINGS:  11/30/2022  review of chart:   IMPRESSION: 1. Small radial/free edge tear in the posterior horn medial meniscus. 2. Moderate to prominent focal but smoothly marginated chondral thinning along the posterior patellar ridge and adjacent medial facet. Otherwise mild degenerative chondral  thinning. 3. Small knee effusion. 4. Mild pes anserine bursitis.  PATIENT SURVEYS:  11/30/2022 FOTO 69 (risk-adjusted 62, Goal 79 in 10 visits)   COGNITION:  11/30/2022 Overall cognitive status: Within functional limits for tasks assessed     SENSATION: 11/30/2022 No tingling or numbness  LOWER EXTREMITY ROM:  Active ROM Left/Right 11/30/2022   Hip flexion    Hip extension    Hip abduction    Hip adduction    Hip internal rotation    Hip external rotation    Knee flexion 149/137   Knee extension 0/0   Ankle dorsiflexion    Ankle plantarflexion    Ankle inversion    Ankle eversion     (Blank rows = not tested)  LOWER EXTREMITY STRENGTH:  12/07/2022:  review from September 02, 2022 testing of knee extension with dynamometry sitting:  Rt 48.5 lb avg   Lt 50 lb avg   Left/Right 11/30/2022 Thigh girth is cm assessed 15 cm proximal to the superior patellar pole Right 12/07/2022 Left 12/07/2022  Hip flexion     Hip extension     Hip abduction     Hip adduction     Hip internal rotation     Hip external rotation     Knee flexion     Knee extension 59.0/58.5 5/5  37.2, 34.1 lbs 5/5  43.3, 44.1 lbs  Ankle dorsiflexion     Ankle plantarflexion     Ankle inversion     Ankle eversion      (Blank rows = not tested)  Functional Testing: 12/07/2022: Lt SLS: Rt SLS:   GAIT: 11/30/2022 Distance walked: 100 feet Assistive device utilized: None Level of assistance: Complete Independence Comments: Has not returned to tennis                      TODAY'S TREATMENT:                                                                       DATE:  12/23/2022 Seated straight leg raises 6 sets of 5 with 2# Discussion on the importance of the stationary bike (push/quadriceps emphasis), taking at least 2 days a week off PT exercises and lower extremity exercises for muscle growth and recovery and the importance of avoiding overdoing weight-bearing exercises and activities  Neuromuscular  re-education: Single-leg balance: eyes open; head turning; eyes closed; ball of foot 2 x 20 seconds each  Vaso right knee Medium 34* 10 minutes   12/07/2022 Therex: Recumbent bike lvl 3 5 mins for ROM Lateral step down 4 inch step WB 2 x 15 c blue band TKE use Leg press Rt leg 56 lbs 2 x 15 c slow lowering focus Trial of alternating heel/toe lifts c focus on  minimal postural sway x 5 each Reviewed seated SLR verbally with demonstration.   Cues for HEP additions of banded TKE in WB, leg press at gym as tolerated as well as inclusion of existing prior HEP pre surgery to most recent HEP. Additional time spent in education of techniques and adjustments based off symptom presentation.  Verbal review of previous exercises was performed   TODAY'S TREATMENT:                                                                       DATE: 11/30/2022 Seated straight leg raises 2 sets of 5 Standing heel to toe raises (no hands) 10 x 3 seconds  Neuromuscular re-education: Tandem balance eyes open; head turning; eyes closed 1 x 20 seconds each   PATIENT EDUCATION:  12/07/2022 Education details: HEP update Person educated: Patient Education method: Programmer, multimedia, Demonstration, Verbal cues, and Handouts Education comprehension: verbalized understanding, returned demonstration, and verbal cues required  HOME EXERCISE PROGRAM: Access Code: [email or web address] URL: [email or web address] Date: 12/07/2022 Prepared by: Chyrel Masson  Exercises - Straight Leg Raise with External Rotation  - 2 x daily - 7 x weekly - 2 sets - 10 reps - Bridge with Hip Abduction and Resistance  - 2 x daily - 7 x weekly - 2 sets - 10 reps - Seated Straight Leg Heel Taps  - 1-2 x daily - 7 x weekly - 2-3 sets - 10 reps - Energy manager with Strap (Mirrored)  - 1-2 x daily - 7 x weekly - 1 sets - 5 reps - 30 hold - Prone Quad Stretch with Towel Roll and Strap  - 1 x daily - 7 x weekly - 1 sets - 3-5 reps - 30 hold - Sit to  Stand  - 3 x daily - 7 x weekly - 1 sets - 10 reps - Tandem Stance  - 1 x daily - 7 x weekly - 1 sets - 3-5 reps - 30 hold - Heel Toe Raises with Counter Support  - 1 x daily - 7 x weekly - 1-2 sets - 10 reps  Updated to include existing HEP from pre surgery.   Pt had access to this code on her app.   ASSESSMENT:  CLINICAL IMPRESSION: Idalys notes excellent HEP compliance.  She has not returned to tennis and appears to be doing a bit too much with her weight-bearing PT exercises, walking and activities.  I encouraged her to slow down a bit, focus on non-weight-bearing straight leg raises with resistance, biking and balance.  We will reintroduce weight-bearing and more advanced single leg activities as medial knee pain subsides.  OBJECTIVE IMPAIRMENTS: Abnormal gait, decreased activity tolerance, decreased endurance, decreased knowledge of condition, difficulty walking, decreased ROM, decreased strength, decreased safety awareness, increased edema, impaired perceived functional ability, and pain.   ACTIVITY LIMITATIONS: lifting, bending, sitting, standing, squatting, stairs, and locomotion level  PARTICIPATION LIMITATIONS: community activity  PERSONAL FACTORS:  Migraines and neuropathy  are also affecting patient's functional outcome.   REHAB POTENTIAL: Good  CLINICAL DECISION MAKING: Stable/uncomplicated  EVALUATION COMPLEXITY: Low   GOALS: Goals reviewed with patient? Yes  SHORT TERM GOALS: Target date: 12/28/2022 Improve right knee flexion active range of motion to 149 degrees Baseline: 137 degrees Goal  status:  On going 12/07/2022  2.  Jordon will be independent with her day 1 home exercise program Baseline: Started 11/30/2022 Goal status: Met 12/23/2022  3.  Branna will be able to hit balls and run select tennis drills without any increases in right knee joint pain Baseline: Will be slowly starting over the next 4 weeks Goal status: On going 12/23/2022   LONG TERM GOALS: Target  date: 01/25/2023  Improve FOTO to 79 Baseline: 69, risk adjusted 62 Goal status: INITIAL  2.  Improve right knee pain to consistently 0-2 out of 10 on the numeric pain rating scale Baseline: 0-4 out of 10 Goal status: On Going 12/23/2022  3.  Improve right quadriceps strength as assessed by equal thigh girth to the uninvolved left and to return to tennis without pain Baseline: 0.5 cm of right thigh atrophy and unable to participate in full tennis activities Goal status: INITIAL  4.  Ellie will be independent with her long-term home exercise program at discharge Baseline: Started 11/30/2022 Goal status: On Going 12/23/2022   PLAN:  PT FREQUENCY: 1-2x/week  PT DURATION: 8 weeks  PLANNED INTERVENTIONS: Therapeutic exercises, Therapeutic activity, Neuromuscular re-education, Balance training, Gait training, Patient/Family education, Self Care, Stair training, Electrical stimulation, Cryotherapy, and Vasopneumatic device  PLAN FOR NEXT SESSION: Progressive quadriceps strengthening without pain or edema increases.  Progress balance, proprioceptive and single-leg resistance activities as appropriate.   Cherlyn Cushing PT, MPT 12/23/22  5:02 PM   PHYSICAL THERAPY DISCHARGE SUMMARY  Visits from Start of Care: 3  Current functional level related to goals / functional outcomes: See note   Remaining deficits: See note   Education / Equipment: HEP  Patient goals were partially met. Patient is being discharged due to not returning since the last visit.  Chyrel Masson, PT, DPT, OCS, ATC 02/10/23  11:17 AM

## 2022-12-30 ENCOUNTER — Encounter: Payer: BC Managed Care – PPO | Admitting: Rehabilitative and Restorative Service Providers"

## 2023-01-06 ENCOUNTER — Encounter: Payer: BC Managed Care – PPO | Admitting: Rehabilitative and Restorative Service Providers"

## 2023-01-14 ENCOUNTER — Other Ambulatory Visit: Payer: Self-pay | Admitting: Neurology

## 2023-01-14 NOTE — Telephone Encounter (Signed)
Last seen on 10/28/21 No follow up scheduled  Last filled on 12/06/22 #18 tablets (30 day supply)

## 2023-01-31 ENCOUNTER — Other Ambulatory Visit: Payer: Self-pay | Admitting: Neurology

## 2023-01-31 DIAGNOSIS — G43009 Migraine without aura, not intractable, without status migrainosus: Secondary | ICD-10-CM

## 2023-02-03 ENCOUNTER — Telehealth: Payer: Self-pay | Admitting: Gastroenterology

## 2023-02-03 ENCOUNTER — Other Ambulatory Visit: Payer: Self-pay | Admitting: Neurology

## 2023-02-03 NOTE — Telephone Encounter (Signed)
Pt called wanting to know when her Nurtec will be called in. Please advise.

## 2023-02-03 NOTE — Telephone Encounter (Signed)
Inbound call from patient stating she needs a refill for Linzess. States insurance needs an update form filled out. Requesting a call back to discuss further. Please advise, thank you.

## 2023-02-04 ENCOUNTER — Other Ambulatory Visit: Payer: Self-pay

## 2023-02-04 ENCOUNTER — Other Ambulatory Visit: Payer: Self-pay | Admitting: Gastroenterology

## 2023-02-04 ENCOUNTER — Other Ambulatory Visit: Payer: Self-pay | Admitting: Neurology

## 2023-02-04 MED ORDER — RIZATRIPTAN BENZOATE 10 MG PO TBDP: 10.0000 mg | ORAL_TABLET | ORAL | 11 refills | Status: DC | PRN

## 2023-02-04 NOTE — Telephone Encounter (Signed)
Follow up has been scheduled for 04-2023 .

## 2023-02-04 NOTE — Telephone Encounter (Signed)
Patient needs a PA for the Linzess, request sent to the PA team. Will await response. [name] is aware. Would you like her to schedule a follow up for constipation as she was last seen 2022. She states she ws told to follow up in 3 years? Please advise.

## 2023-02-04 NOTE — Telephone Encounter (Signed)
The three year recall was for the colonoscopy (after the 2022 procedure).  Patients need to me seen at least every 2 years to continue receiving a prescription from me.  Please make an appointment with me or an APP.  - HD

## 2023-02-04 NOTE — Telephone Encounter (Signed)
PA needed for Linzess .   Thank you,   Sheralyn Boatman, CMA

## 2023-02-15 ENCOUNTER — Other Ambulatory Visit (HOSPITAL_COMMUNITY): Payer: Self-pay

## 2023-02-15 ENCOUNTER — Other Ambulatory Visit: Payer: Self-pay | Admitting: Gastroenterology

## 2023-02-15 NOTE — Telephone Encounter (Signed)
PA not able to be completed at this time due to last visit in 2022, patient needs appointment with updated notes. ( Patient has appt 04/2023)

## 2023-02-16 ENCOUNTER — Other Ambulatory Visit: Payer: Self-pay | Admitting: Gastroenterology

## 2023-02-22 ENCOUNTER — Other Ambulatory Visit: Payer: Self-pay | Admitting: Neurology

## 2023-02-22 ENCOUNTER — Telehealth: Payer: Self-pay | Admitting: Neurology

## 2023-02-22 ENCOUNTER — Other Ambulatory Visit: Payer: Self-pay | Admitting: Gastroenterology

## 2023-02-22 MED ORDER — LINACLOTIDE 145 MCG PO CAPS: 145.0000 ug | ORAL_CAPSULE | Freq: Every day | ORAL | 0 refills | Status: DC

## 2023-02-22 MED ORDER — RIZATRIPTAN BENZOATE 10 MG PO TABS: ORAL_TABLET | ORAL | 0 refills | Status: DC

## 2023-02-22 NOTE — Telephone Encounter (Signed)
Refill sent to [facility] [phone]

## 2023-02-22 NOTE — Telephone Encounter (Signed)
Pt request refill  rizatriptan (MAXALT-MLT) 10 MG disintegrating tablet. Sent to [facility] [phone]   Pt has schedule an appt with Dr. Lucia Gaskins on 04/01/23 at 2pm

## 2023-02-24 ENCOUNTER — Other Ambulatory Visit (HOSPITAL_COMMUNITY): Payer: Self-pay

## 2023-02-24 ENCOUNTER — Telehealth: Payer: Self-pay | Admitting: Gastroenterology

## 2023-02-24 NOTE — Telephone Encounter (Signed)
[place], if PA is needed please route to RX prior auth team. If refill is needed this request should be sent to the CMA. Thank you

## 2023-02-24 NOTE — Telephone Encounter (Signed)
Patient has not been seen in office (or video visit) since 2022. PA team has no current chart notes to use. Please have patient come in.

## 2023-02-24 NOTE — Telephone Encounter (Signed)
Inbound call from patient stating she is needing a form filled out for prior auth for linzess medication. Please advise, thank you.

## 2023-02-25 ENCOUNTER — Telehealth: Payer: Self-pay | Admitting: Pharmacy Technician

## 2023-02-25 NOTE — Telephone Encounter (Signed)
Can you find out what IS on her formulary? Or do I just keep guessing?

## 2023-02-25 NOTE — Telephone Encounter (Signed)
Pharmacy Patient Advocate Encounter   Received notification from CoverMyMeds that prior authorization for [facility] is required/requested.   Insurance verification completed.   The patient is insured through [facility] .   Per test claim: PA required; PA submitted to [employer] via CoverMyMeds Key/confirmation #/EOC [id number] Status is pending

## 2023-02-26 ENCOUNTER — Other Ambulatory Visit (HOSPITAL_COMMUNITY): Payer: Self-pay

## 2023-02-26 ENCOUNTER — Telehealth: Payer: Self-pay

## 2023-02-26 NOTE — Telephone Encounter (Signed)
[employer] called stating patient has been approved for Linzess and patient has been made aware. Patient can pick up medication within 24-48 hrs.

## 2023-02-26 NOTE — Telephone Encounter (Signed)
Pharmacy Patient Advocate Encounter   Received notification from RX Request Messages that prior authorization for Eletriptan Hydrobromide 40MG  tablets is required/requested.   Insurance verification completed.   The patient is insured through [facility] .   Per test claim: PA required; PA started via CoverMyMeds. KEY [id number] . Waiting for clinical questions to populate.

## 2023-03-04 ENCOUNTER — Telehealth: Payer: Self-pay | Admitting: Gastroenterology

## 2023-03-04 NOTE — Telephone Encounter (Signed)
Inbound call from patient stating she has not heard anything regarding Linzess prior auth. Patient requesting a call back. Please advise, thank you.

## 2023-03-04 NOTE — Telephone Encounter (Signed)
PA has been approved. Couldn't leave a voicemail, mail box is full. Pharmacy aware and will refill Rx and notify the patient when ready for pick up. My chart also sent to her.

## 2023-03-05 NOTE — Telephone Encounter (Signed)
Clinical questions have been submitted-awaiting determination. 

## 2023-03-08 ENCOUNTER — Other Ambulatory Visit (HOSPITAL_COMMUNITY): Payer: Self-pay

## 2023-03-08 NOTE — Telephone Encounter (Signed)
Side effects from micromedex: Common Gastrointestinal: Nausea (Up to 8% ) Neurologic: Asthenia (4% to 10% ), Somnolence (3% to 7% ) Serious Cardiovascular: Chest pain (Up to 4% ), Coronary artery spasm, Myocardial infarction, Transient myocardial ischemia Immunologic: Anaphylaxis, Hypersensitivity reaction, Non-allergic anaphylaxis Neurologic: Cerebrovascular accident, Seizure Other: Serotonin syndrome

## 2023-03-08 NOTE — Telephone Encounter (Signed)
Pharmacy Patient Advocate Encounter  Received notification from [facility] that Prior Authorization for Eletriptan Hydrobromide 40MG  tablets has been CANCELLED due to this medication is available without prior authorization. Insurance limits to 12 tablets every 30 days. Co-pay is $15.00.   PA #/Case ID/Reference #: [id number]

## 2023-03-31 ENCOUNTER — Other Ambulatory Visit: Payer: Self-pay | Admitting: Neurology

## 2023-04-01 ENCOUNTER — Ambulatory Visit: Payer: BC Managed Care – PPO | Admitting: Neurology

## 2023-04-01 ENCOUNTER — Telehealth: Payer: Self-pay | Admitting: *Deleted

## 2023-04-01 DIAGNOSIS — G43009 Migraine without aura, not intractable, without status migrainosus: Secondary | ICD-10-CM | POA: Diagnosis not present

## 2023-04-01 MED ORDER — RIZATRIPTAN BENZOATE 10 MG PO TABS: 10.0000 mg | ORAL_TABLET | ORAL | 11 refills | Status: DC | PRN

## 2023-04-01 MED ORDER — NURTEC 75 MG PO TBDP
75.0000 mg | ORAL_TABLET | ORAL | 11 refills | Status: DC
Start: 2023-04-01 — End: 2023-06-07

## 2023-04-01 NOTE — Telephone Encounter (Signed)
Nurtec PA needed. Thank you!

## 2023-04-01 NOTE — Progress Notes (Signed)
[facility]    Provider:  Dr Lucia Gaskins Referring Provider: Gaspar Garbe, MD Primary Care Physician:  Gaspar Garbe, MD  CC:  Migraine  April 01, 2023: Patient is very stable, she still has about 6 migraine days a month and less than 10 total headache days a month, she takes Nurtec and Maxalt as needed, she does not feel at this time she needs a preventative, continue current management, we did talk about her options and at this time she prefers to continue current management.  Patient complains of symptoms per HPI as well as the following symptoms: none . Pertinent negatives and positives per HPI. All others negative  Addendum: Migraines improved to 6 migraine days a month and < 10 total headache days a month. Can try Nurtec as prevention.   Interval history Oct 28, 2021: Patient was on Topiramate 100 mg at bedtime.  She weaned off due to giving her husband kidney.  From last appointment we discussed considering Ajovy again but she did not think it helped.  Currently on Maxalt and Nurtec. She missed her last appointment. Did not like Ubrelvy. Could not tolerate Aimovig.   She is having 8 migraine days a month > 15 total  total headache days a month.     Medications tried include: Gabapentin, topiramate up to 200 mg daily, steroids, Cymbalta, diclofenac, Zofran, Mobic, blood pressure medications Spironolactone, tizanidine, Maxalt and Nurtec, ajovy, aimovig, ubrelvy, nurtec, ajovy, aimovig(side effects)  Patient complains of symptoms per HPI as well as the following symptoms: migraine . Pertinent negatives and positives per HPI. All others negative   08/20/20: She is doing well on current therapy. She does not like the Ubrelvy. She prefers Nurtec, we will see if we can get that approved for her, keep her on the topiramate. Also maxalt acutely. We discussed strategies for migraine acute management. She has 10 migraines a month, I offered to try and reduce that with  other management or possibly trying ajovy again (she didn't think it helped). But she is happy with this freq and feels she can treat the migraines when they happen.  Interval history 09/13/2019: She is smelling strong cigarette smoke, happens often, no hx of seizures, she has sinus problems and I recommended starting with ENT, it is associated with masks and adding moisture in the nose helps and she uses saline spray. We discussed Ajovy again. We discussed Ajovy, nurtec. Discussed Frovatriptan and long-acting triptans. I can't refill her xanax, we had a talk about that.    Interval history 02/21/2019: Here for follow up of migraines. She tried the Aimovig but she had constipation and could not tolerate. She saw Dr. Terrace Arabia in the past and has been on multiple meds in the past. Worsening migraines. She is having most of the month if headaches and 10 days a month of migraines. She only took the Aimovig. Recommended Ajovy. Discussed oral medication.   HPI 11/11/2018:  [name] is a 57 y.o. female here as a referral from Dr. Wylene Simmer for migraine. PMHX migraines, irritable bowel symptom, endometriosis, anxiety, anemia.  Patient is transitioning from 1 of my colleagues to me for migraine management.  Patient was previously seen by Dr. love.  Patient has a history of migraines for over 20 years.  She has an extensive family history of migraines.  She has been on multiple medications including Topamax up to 200 mg a day.  Her headaches are normally located in the right side of the head, throbbing  and pulsating, she has osmophobia and smells can trigger, she endorses nausea, so pounding that she can't move, can be severe, also light sensitivity, no aura. 20 headache days a month, more than 10 are migrainous, can be severe and can last > 24 hours without treatment. No medication overuse.   Medications tried include: Gabapentin, topiramate up to 200 mg daily, steroids, Cymbalta, diclofenac, Zofran, Mobic, blood  pressure medications Spironolactone, tizanidine  Reviewed notes, labs and imaging from outside physicians, which showed:  Reviewed EMG study, data and results which was normal no electrodiagnostic evidence of large fiber peripheral neuropathy or right lumbosacral radiculopathy.  MRI of the brain images which I reviewed was normal November 2007.  Reviewed prior neurology notes: Headaches began in childhood since the age of 35.  Neuro is in the left side, and related to her menstrual cycle, and not associated with disturbances of nausea or vomiting or visual but does have a history of endometriosis.  Headaches last intermittently for 5 days and are improved with ketoprofen, Maxalt therapy and coffee.  Her daughter has been to the headache clinic and uses Biofreeze to the scalp as well as Maxalt.  Extensive family history of the patient's daughter, son, grandmother and mother have migraines.  Her migraines are throbbing, she has smell sensitivity and triggers and an ice pick quality.  She has been on multiple packs of steroids.  She has chronic neck pain.  IV Depacon have worked acutely for her migraines in the past  Review of Systems: Patient complains of symptoms per HPI as well as the following symptoms: headache . Pertinent negatives and positives per HPI. All others negative    Social History   Socioeconomic History   Marital status: Married    Spouse name: Acupuncturist   Number of children: 2   Years of education: Not on file   Highest education level: Associate degree: academic program  Occupational History   Occupation: stay at home  Tobacco Use   Smoking status: Former   Smokeless tobacco: Never  Advertising account planner   Vaping status: Never Used  Substance and Sexual Activity   Alcohol use: Yes    Comment: social   Drug use: No   Sexual activity: Yes    Birth control/protection: Surgical  Other Topics Concern   Not on file  Social History Narrative   Patient lives at home with her husband  Mullan. Drinks two cups of caffeine daily. Patient has two years of college education. Right handed.   Social Determinants of Health   Financial Resource Strain: Not on file  Food Insecurity: Not on file  Transportation Needs: Not on file  Physical Activity: Not on file  Stress: Not on file  Social Connections: Not on file  Intimate Partner Violence: Not on file    Family History  Problem Relation Age of Onset   Diabetes Mother        both sides grandparents   Hypertension Mother    Colon polyps Mother    Migraines Mother    Thyroid disease Father    Colon polyps Father    Migraines Maternal Grandmother    Migraines Son    Migraines Daughter    Colon cancer Neg Hx    Stomach cancer Neg Hx    Esophageal cancer Neg Hx    Prostate cancer Neg Hx    Rectal cancer Neg Hx     Past Medical History:  Diagnosis Date   Anemia    Anxiety  Brachial neuritis or radiculitis NOS    COVID-19 virus infection    Endometriosis    H/O: hysterectomy    IBS (irritable bowel syndrome)    Migraine, unspecified, without mention of intractable migraine without mention of status migrainosus    Migraines    Neuropathy    Shoulder pain     Past Surgical History:  Procedure Laterality Date   ABDOMINAL HYSTERECTOMY     CHOLECYSTECTOMY     CYSTOSCOPY  11/17/2011   Procedure: CYSTOSCOPY;  Surgeon: Juluis Mire, MD;  Location: [facility];  Service: Gynecology;;   DIAGNOSTIC LAPAROSCOPY     LAPAROSCOPY  11/17/2011   Procedure: LAPAROSCOPY OPERATIVE;  Surgeon: Juluis Mire, MD;  Location: [facility];  Service: Gynecology;  Laterality: N/A;  with Injection of Vaginal Cuff with Dexamethasone mixed in 0.5% Bupivacaine   NASAL SEPTUM SURGERY     bone spur    Current Outpatient Medications  Medication Sig Dispense Refill   linaclotide (LINZESS) 145 MCG CAPS capsule Take 1 capsule (145 mcg total) by mouth daily before breakfast. 30 capsule 0   loratadine (CLARITIN) 10 MG tablet Take 10 mg by mouth daily  as needed for allergies.     Magnesium 300 MG CAPS Take 300 mg by mouth daily.     tretinoin (RETIN-A) 0.025 % cream as needed.  3   Rimegepant Sulfate (NURTEC) 75 MG TBDP Take 1 tablet (75 mg total) by mouth every other day. Please use copay card: BIN# [id number] : PCN# CN: BJY#:[id number]   ID#: [id number] 16 tablet 11   rizatriptan (MAXALT) 10 MG tablet Take 1 tablet (10 mg total) by mouth as needed for migraine. May repeat in 2 hours if needed 16 tablet 11   No current facility-administered medications for this visit.    Allergies as of 04/01/2023 - Review Complete 04/01/2023  Allergen Reaction Noted   Tylox [oxycodone-acetaminophen] Itching 11/20/2011   Codeine  08/05/2011    Vitals: There were no vitals taken for this visit. Last Weight:  Wt Readings from Last 1 Encounters:  10/28/21 171 lb 9.6 oz (77.8 kg)   Last Height:   Ht Readings from Last 1 Encounters:  10/28/21 5\' 8"  (1.727 m)   Physical exam: Exam: Gen: NAD, conversant, well nourised, obese, well groomed                     CV: RRR, no MRG. No Carotid Bruits. No peripheral edema, warm, nontender Eyes: Conjunctivae clear without exudates or hemorrhage  Neuro: Detailed Neurologic Exam  Speech:    Speech is normal; fluent and spontaneous with normal comprehension.  Cognition:    The patient is oriented to person, place, and time;     recent and remote memory intact;     language fluent;     normal attention, concentration,     fund of knowledge Cranial Nerves:    The pupils are equal, round, and reactive to light. The fundi are normal and spontaneous venous pulsations are present. Visual fields are full to finger confrontation. Extraocular movements are intact. Trigeminal sensation is intact and the muscles of mastication are normal. The face is symmetric. The palate elevates in the midline. Hearing intact. Voice is normal. Shoulder shrug is normal. The tongue has normal motion without fasciculations.    Coordination: nml  Gait: nml  Motor Observation:    No asymmetry, no atrophy, and no involuntary movements noted. Tone:    Normal muscle tone.    Posture:  Posture is normal. normal erect    Strength:    Strength is V/V in the upper and lower limbs.      Sensation: intact to LT     Reflex Exam:  DTR's:    Deep tendon reflexes in the upper and lower extremities are normal bilaterally.   Toes:    The toes are downgoing bilaterally.   Clonus:    Clonus is absent.     Assessment/Plan:  57 year old female with episodic migraines. Failed multiple classes of medications.  She feels stable on Nurtec and Maxalt, we discussed preventatives at this time she is currently stable and prefers to continue.  Addendum: Migraines improved to 6 migraine days a month and < 10 total headache days a month. Can try Nurtec as prevention.   Discussed botox in the past she declined, tried Ajovy, [place], [place], [place], [place] in the past.  Nurtec has been effective. Topamax discontnued, Nurtec and maxalt for acute management: continue discussed medication overuse headache, use no more than 10 days a month, she does not have any medication overuse or aura Refill maxalt, stop diclofenac(she has ulcer).   Meds ordered this encounter  Medications   Rimegepant Sulfate (NURTEC) 75 MG TBDP    Sig: Take 1 tablet (75 mg total) by mouth every other day. Please use copay card: BIN# [id number] : PCN# CN: [id number]#:[id number]   ID#: [id number]    Dispense:  16 tablet    Refill:  11    Please use copay card: BIN# [id number] : PCN# CN: [id number]#:[id number]   ID#: [id number]   rizatriptan (MAXALT) 10 MG tablet    Sig: Take 1 tablet (10 mg total) by mouth as needed for migraine. May repeat in 2 hours if needed    Dispense:  16 tablet    Refill:  11     Discussed: To prevent or relieve headaches, try the following: Cool Compress. Lie down and place a cool compress on your head.  Avoid headache triggers. If  certain foods or odors seem to have triggered your migraines in the past, avoid them. A headache diary might help you identify triggers.  Include physical activity in your daily routine. Try a daily walk or other moderate aerobic exercise.  Manage stress. Find healthy ways to cope with the stressors, such as delegating tasks on your to-do list.  Practice relaxation techniques. Try deep breathing, yoga, massage and visualization.  Eat regularly. Eating regularly scheduled meals and maintaining a healthy diet might help prevent headaches. Also, drink plenty of fluids.  Follow a regular sleep schedule. Sleep deprivation might contribute to headaches Consider biofeedback. With this mind-body technique, you learn to control certain bodily functions -- such as muscle tension, heart rate and blood pressure -- to prevent headaches or reduce headache pain.    Proceed to emergency room if you experience new or worsening symptoms or symptoms do not resolve, if you have new neurologic symptoms or if headache is severe, or for any concerning symptom.   Provided education and documentation from American headache Society toolbox including articles on: chronic migraine medication overuse headache, chronic migraines, prevention of migraines, behavioral and other nonpharmacologic treatments for headache.    Naomie Dean, MD  [facility] [address] [place], [place] [address]  Phone [phone] Fax [phone]  I spent 20 minutes of face-to-face and non-face-to-face time with patient on the  1. Migraine without aura and without status migrainosus, not intractable    diagnosis.  This included previsit  chart review, lab review, study review, order entry, electronic health record documentation, patient education on the different diagnostic and therapeutic options, counseling and coordination of care, risks and benefits of management, compliance, or risk factor reduction

## 2023-04-01 NOTE — Patient Instructions (Signed)
Nurtec and maxalt

## 2023-04-02 ENCOUNTER — Telehealth: Payer: Self-pay

## 2023-04-02 ENCOUNTER — Other Ambulatory Visit (HOSPITAL_COMMUNITY): Payer: Self-pay

## 2023-04-02 NOTE — Telephone Encounter (Signed)
Clinical questions have been submitted-awaiting determination. 

## 2023-04-02 NOTE — Telephone Encounter (Signed)
PA request has been Submitted. New Encounter created for follow up. For additional info see Pharmacy Prior Auth telephone encounter from 04/02/2023.

## 2023-04-02 NOTE — Telephone Encounter (Signed)
Pharmacy Patient Advocate Encounter   Received notification from Physician's Office that prior authorization for Nurtec 75MG  dispersible tablets is required/requested.   Insurance verification completed.   The patient is insured through [facility] .   Per test claim: PA required; PA started via CoverMyMeds. KEY [id number] . Waiting for clinical questions to populate.

## 2023-04-04 ENCOUNTER — Encounter: Payer: Self-pay | Admitting: Neurology

## 2023-04-07 ENCOUNTER — Other Ambulatory Visit (HOSPITAL_COMMUNITY): Payer: Self-pay

## 2023-04-23 ENCOUNTER — Ambulatory Visit: Payer: BC Managed Care – PPO | Admitting: Nurse Practitioner

## 2023-05-11 ENCOUNTER — Ambulatory Visit: Payer: BC Managed Care – PPO | Admitting: Gastroenterology

## 2023-05-11 ENCOUNTER — Encounter: Payer: Self-pay | Admitting: Gastroenterology

## 2023-05-11 VITALS — BP 106/70 | HR 73 | Ht 68.0 in | Wt 159.0 lb

## 2023-05-11 DIAGNOSIS — R143 Flatulence: Secondary | ICD-10-CM | POA: Diagnosis not present

## 2023-05-11 DIAGNOSIS — K5909 Other constipation: Secondary | ICD-10-CM | POA: Diagnosis not present

## 2023-05-11 NOTE — Progress Notes (Signed)
[name] Progress Note  Chief Complaint: Chronic constipation  Subjective  Prior history Summary of GI history from office visit with me April 2022: Clinic visit in January for longstanding chronic idiopathic constipation with bloating (former [facility] patient), rectal bleeding that sounded likely hemorrhoidal, and heartburn and dysphagia. She takes as needed Linzess for relief of constipation, and she schedules the timing of that because it causes evacuation with loose stool.  She had been given some samples of Motegrity, but decided not to take it since she thinks the Linzess works well enough. 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 Pylori. -She was then started on omeprazole 40 mg twice daily.   [name] reports that the dysphagia is improved on twice daily pantoprazole (it was switched for insurance reasons), and she is also more careful and has limited certain possible food triggers.  She is also cut down on ibuprofen use.  Constipation is the same, experience with Linzess is the same and she would like to continue that because it works predictably. She still has hemorrhoidal prolapse and bleeding with the effect of Linzess.  Large hemorrhoids have been noted on the last colonoscopy, and she was referred to colorectal surgery Maisie Fus at [facility]), though I do not do not see any notes indicating she had that visit.  I have continued to prescribe refills of Linzess since then.    Discussed the use of AI scribe software for clinical note transcription with the patient, who gave verbal consent to proceed.  History of Present Illness   The patient, with a history of irritable bowel syndrome, continues  to experience significant gastrointestinal symptoms despite ongoing treatment with Linzess. They report that the medication is effective, but its unpredictable onset of action necessitates careful planning around their schedule. The patient typically takes the medication in the evening, approximately 30 minutes before dinner, and experiences its effects within an hour of eating. They note that the medication is taken only twice a week due to its unpredictable nature and the need to stay home following its administration.  In addition to the constipation, the patient reports significant issues with gas, which can be severe enough to disrupt their daily activities. The gas does not appear to correlate with specific foods or the timing of the Linzess administration. The patient has found some relief from the gas symptoms with the use of Xanax, suggesting a possible link between their gastrointestinal symptoms and stress or anxiety. Despite these ongoing issues, the patient has elected to continue with the current treatment regimen, expressing a reluctance to trial a different medication (Motegrity) previously discussed.     Denies rectal bleeding. Last colonoscopy report and findings reviewed along with plan for recall.   ROS: Cardiovascular:  no chest pain Respiratory: no dyspnea Periodic anxiety Headaches Arthralgias Remainder of systems negative except as above The patient's Past Medical, Family and Social History were reviewed and are on file in the EMR. Past Medical History:  Diagnosis Date   Anemia    Anxiety    Brachial neuritis or radiculitis NOS    COVID-19 virus infection    Endometriosis    H/O: hysterectomy    IBS (irritable bowel syndrome)    Migraine, unspecified, without  mention of intractable migraine without mention of status migrainosus    Migraines    Neuropathy    Shoulder pain    She has been evaluated as a potential donor for her husband who is expected to eventually  need a renal transplant.  Past Surgical History:  Procedure Laterality Date   ABDOMINAL HYSTERECTOMY     CHOLECYSTECTOMY     CYSTOSCOPY  11/17/2011   Procedure: CYSTOSCOPY;  Surgeon: Juluis Mire, MD;  Location: [facility];  Service: Gynecology;;   DIAGNOSTIC LAPAROSCOPY     LAPAROSCOPY  11/17/2011   Procedure: LAPAROSCOPY OPERATIVE;  Surgeon: Juluis Mire, MD;  Location: [facility];  Service: Gynecology;  Laterality: N/A;  with Injection of Vaginal Cuff with Dexamethasone mixed in 0.5% Bupivacaine   NASAL SEPTUM SURGERY     bone spur     Objective:  Med list reviewed  Current Outpatient Medications:    linaclotide (LINZESS) 145 MCG CAPS capsule, Take 1 capsule (145 mcg total) by mouth daily before breakfast., Disp: 30 capsule, Rfl: 0   loratadine (CLARITIN) 10 MG tablet, Take 10 mg by mouth daily as needed for allergies., Disp: , Rfl:    Magnesium 300 MG CAPS, Take 300 mg by mouth daily., Disp: , Rfl:    Rimegepant Sulfate (NURTEC) 75 MG TBDP, Take 1 tablet (75 mg total) by mouth every other day. Please use copay card: BIN# [id number] : PCN# CN: LOV#:[id number]   ID#: [id number], Disp: 16 tablet, Rfl: 11   rizatriptan (MAXALT) 10 MG tablet, Take 1 tablet (10 mg total) by mouth as needed for migraine. May repeat in 2 hours if needed, Disp: 16 tablet, Rfl: 11   tretinoin (RETIN-A) 0.025 % cream, as needed., Disp: , Rfl: 3   Vital signs in last 24 hrs: Vitals:   05/11/23 1038  BP: 106/70  Pulse: 73   Wt Readings from Last 3 Encounters:  05/11/23 159 lb (72.1 kg)  10/28/21 171 lb 9.6 oz (77.8 kg)  10/08/20 164 lb (74.4 kg)    Physical Exam  Well-appearing HEENT: sclera anicteric, oral mucosa moist without lesions Neck: supple, no thyromegaly, JVD or lymphadenopathy Cardiac: Regular without appreciable murmur,  no peripheral edema Pulm: clear to auscultation bilaterally, normal RR and effort noted Abdomen: soft, no tenderness, with active bowel sounds. No guarding or palpable  hepatosplenomegaly. Skin; warm and dry, no jaundice or rash   Labs:   ___________________________________________ Radiologic studies:   ____________________________________________ Other:   _____________________________________________ Assessment & Plan  Assessment: Encounter Diagnoses  Name Primary?   Chronic constipation Yes   Flatulence    IBS-C, flatulence from dysmotility, probable maldigestion of some foodstuffs, and a probable stress/anxiety trigger from her history given above. Assessment and Plan    Chronic Constipation Patient continues to use Linzess with some success, but reports unpredictable timing of effect and excessive gas. Patient has adapted use to her lifestyle, taking it in the evening and not daily. -Continue current regimen of Linzess as tolerated by the patient.  Excessive Gas Patient reports significant gas, sometimes severe enough to cancel activities. Some relief noted with Xanax, suggesting a possible link to stress/anxiety. -Continue current management strategies. Consider further evaluation if symptoms worsen or new symptoms develop.  Surveillance colonoscopy recall has been set for March 2025.  She will contact us then if she has not received a recall letter by that time.      20 minutes were spent on this encounter (including chart review, history/exam, counseling/coordination of care, and documentation) >  50% of that time was spent on counseling and coordination of care.   [name] III

## 2023-06-01 ENCOUNTER — Other Ambulatory Visit: Payer: Self-pay | Admitting: Neurology

## 2023-06-01 DIAGNOSIS — G43009 Migraine without aura, not intractable, without status migrainosus: Secondary | ICD-10-CM

## 2023-06-05 NOTE — Telephone Encounter (Signed)
Hi [name] and Pod 4, does she need another PA? States was approved until 11/7 so can;t get it anymore? Does she need another PA? She tried to pick it uo and they said she needed PA can we look into it nd call her with results thanks!

## 2023-06-07 ENCOUNTER — Telehealth: Payer: Self-pay

## 2023-06-07 NOTE — Telephone Encounter (Signed)
*  [facility]  Pharmacy Patient Advocate Encounter   Received notification from Pt Calls Messages that prior authorization for Nurtec 75MG  dispersible tablets  is required/requested.   Insurance verification completed.   The patient is insured through [facility] .   Per test claim: PA required; PA submitted to above mentioned insurance via CoverMyMeds Key/confirmation #/EOC [id number] Status is pending

## 2023-06-07 NOTE — Telephone Encounter (Signed)
PA request has been Submitted. New Encounter created for follow up. For additional info see Pharmacy Prior Auth telephone encounter from 12/09.

## 2023-06-08 ENCOUNTER — Other Ambulatory Visit (HOSPITAL_COMMUNITY): Payer: Self-pay

## 2023-06-08 NOTE — Telephone Encounter (Signed)
Pharmacy Patient Advocate Encounter  Received notification from [facility] that Prior Authorization for Nurtec 75MG  dispersible tablets  has been APPROVED from 06/07/2023 to 06/06/2024. Ran test claim, Copay is $100.00. This test claim was processed through [facility]- copay amounts may vary at other pharmacies due to pharmacy/plan contracts, or as the patient moves through the different stages of their insurance plan.   PA #/Case ID/Reference #: [id number]

## 2023-06-10 ENCOUNTER — Other Ambulatory Visit (HOSPITAL_COMMUNITY): Payer: Self-pay

## 2023-07-20 ENCOUNTER — Other Ambulatory Visit: Payer: Self-pay | Admitting: Gastroenterology

## 2023-08-16 ENCOUNTER — Other Ambulatory Visit: Payer: Self-pay | Admitting: Gastroenterology

## 2023-08-26 ENCOUNTER — Encounter: Payer: Self-pay | Admitting: Gastroenterology

## 2023-08-26 DIAGNOSIS — R7989 Other specified abnormal findings of blood chemistry: Secondary | ICD-10-CM | POA: Diagnosis not present

## 2023-08-26 DIAGNOSIS — R739 Hyperglycemia, unspecified: Secondary | ICD-10-CM | POA: Diagnosis not present

## 2023-09-01 DIAGNOSIS — Z0000 Encounter for general adult medical examination without abnormal findings: Secondary | ICD-10-CM | POA: Diagnosis not present

## 2023-09-01 DIAGNOSIS — G43009 Migraine without aura, not intractable, without status migrainosus: Secondary | ICD-10-CM | POA: Diagnosis not present

## 2023-09-01 DIAGNOSIS — Z1339 Encounter for screening examination for other mental health and behavioral disorders: Secondary | ICD-10-CM | POA: Diagnosis not present

## 2023-09-01 DIAGNOSIS — Z1331 Encounter for screening for depression: Secondary | ICD-10-CM | POA: Diagnosis not present

## 2023-10-06 ENCOUNTER — Other Ambulatory Visit: Payer: Self-pay | Admitting: Neurology

## 2023-10-18 ENCOUNTER — Encounter: Payer: Self-pay | Admitting: Neurology

## 2023-10-18 ENCOUNTER — Other Ambulatory Visit: Payer: Self-pay | Admitting: Neurology

## 2023-10-18 MED ORDER — RIZATRIPTAN BENZOATE 10 MG PO TBDP: 10.0000 mg | ORAL_TABLET | ORAL | 2 refills | Status: DC | PRN

## 2023-10-18 NOTE — Telephone Encounter (Signed)
 Pt maxalt  ODT requested as tablets backordered.

## 2023-11-08 ENCOUNTER — Encounter: Payer: Self-pay | Admitting: Orthopaedic Surgery

## 2023-11-08 ENCOUNTER — Other Ambulatory Visit: Payer: Self-pay | Admitting: Orthopaedic Surgery

## 2023-11-08 MED ORDER — PREDNISONE 10 MG (21) PO TBPK: ORAL_TABLET | ORAL | 3 refills | Status: AC

## 2023-11-10 ENCOUNTER — Other Ambulatory Visit: Payer: Self-pay | Admitting: Gastroenterology

## 2023-11-11 MED ORDER — LINACLOTIDE 145 MCG PO CAPS: 145.0000 ug | ORAL_CAPSULE | Freq: Every day | ORAL | 5 refills | Status: DC

## 2023-11-17 DIAGNOSIS — Z6824 Body mass index (BMI) 24.0-24.9, adult: Secondary | ICD-10-CM | POA: Diagnosis not present

## 2023-11-17 DIAGNOSIS — Z1231 Encounter for screening mammogram for malignant neoplasm of breast: Secondary | ICD-10-CM | POA: Diagnosis not present

## 2023-11-17 DIAGNOSIS — Z01419 Encounter for gynecological examination (general) (routine) without abnormal findings: Secondary | ICD-10-CM | POA: Diagnosis not present

## 2024-01-25 ENCOUNTER — Other Ambulatory Visit: Payer: Self-pay | Admitting: Neurology

## 2024-03-02 ENCOUNTER — Encounter: Payer: Self-pay | Admitting: Gastroenterology

## 2024-03-29 ENCOUNTER — Ambulatory Visit (AMBULATORY_SURGERY_CENTER)

## 2024-03-29 ENCOUNTER — Encounter: Payer: Self-pay | Admitting: Gastroenterology

## 2024-03-29 VITALS — Ht 68.0 in | Wt 166.6 lb

## 2024-03-29 DIAGNOSIS — Z860100 Personal history of colon polyps, unspecified: Secondary | ICD-10-CM

## 2024-03-29 MED ORDER — ONDANSETRON HCL 4 MG PO TABS: 4.0000 mg | ORAL_TABLET | ORAL | 0 refills | Status: AC

## 2024-03-29 MED ORDER — NA SULFATE-K SULFATE-MG SULF 17.5-3.13-1.6 GM/177ML PO SOLN: 1.0000 | Freq: Once | ORAL | 0 refills | Status: AC

## 2024-03-29 NOTE — Progress Notes (Signed)
 No egg or soy allergy known to patient  No issues known to pt with past sedation with any surgeries or procedures Patient denies ever being told they had issues or difficulty with intubation  No FH of Malignant Hyperthermia Pt is not on diet pills Pt is not on  home 02  Pt is not on blood thinners  Pt denies has with constipation and takes magnesium and Linzess  No A fib or A flutter Have any cardiac testing pending--no Pt can ambulate indepenedently Pt denies use of chewing tobacco Discussed diabetic I weight loss medication holds Discussed NSAID holds Checked BMI Pt instructed to use Single[employer] or GoodRx for a price reduction on prep  Patient's chart reviewed by [name] CNRA prior to previsit and patient appropriate for the [facility].  Pre visit completed and red dot placed by patient's name on their procedure day (on provider's schedule).

## 2024-04-12 ENCOUNTER — Encounter: Payer: Self-pay | Admitting: Gastroenterology

## 2024-04-12 ENCOUNTER — Ambulatory Visit: Admitting: Gastroenterology

## 2024-04-12 VITALS — BP 113/76 | HR 64 | Temp 97.3°F | Resp 13 | Ht 68.0 in | Wt 166.6 lb

## 2024-04-12 DIAGNOSIS — K5730 Diverticulosis of large intestine without perforation or abscess without bleeding: Secondary | ICD-10-CM | POA: Diagnosis not present

## 2024-04-12 DIAGNOSIS — Z1211 Encounter for screening for malignant neoplasm of colon: Secondary | ICD-10-CM

## 2024-04-12 DIAGNOSIS — Z860101 Personal history of adenomatous and serrated colon polyps: Secondary | ICD-10-CM

## 2024-04-12 DIAGNOSIS — K648 Other hemorrhoids: Secondary | ICD-10-CM

## 2024-04-12 DIAGNOSIS — D120 Benign neoplasm of cecum: Secondary | ICD-10-CM | POA: Diagnosis not present

## 2024-04-12 DIAGNOSIS — K635 Polyp of colon: Secondary | ICD-10-CM | POA: Diagnosis not present

## 2024-04-12 DIAGNOSIS — Z860100 Personal history of colon polyps, unspecified: Secondary | ICD-10-CM

## 2024-04-12 MED ORDER — SODIUM CHLORIDE 0.9 % IV SOLN: 500.0000 mL | Freq: Once | INTRAVENOUS | Status: DC

## 2024-04-12 NOTE — Progress Notes (Signed)
 Pt's states no medical or surgical changes since previsit or office visit.

## 2024-04-12 NOTE — Progress Notes (Signed)
Vs nad trans to pacu

## 2024-04-12 NOTE — Progress Notes (Signed)
 Called to room to assist during endoscopic procedure.  Patient ID and intended procedure confirmed with present staff. Received instructions for my participation in the procedure from the performing physician.

## 2024-04-12 NOTE — Patient Instructions (Signed)

## 2024-04-12 NOTE — Progress Notes (Signed)
 History and Physical:  This patient presents for endoscopic testing for: Encounter Diagnosis  Name Primary?   Hx of colonic polyps Yes    58 year old woman here today for a surveillance colonoscopy.  Last colonoscopy in March 2022, at which time a 12 mm ascending colon SSP was removed.  There was an additional diminutive polyp removed but tissue not retrieved.  Patient also has chronic constipation treated with Linzess  and internal hemorrhoids.  Patient is otherwise without complaints or active issues today.   Past Medical History: Past Medical History:  Diagnosis Date   Anemia    Anxiety    Brachial neuritis or radiculitis NOS    COVID-19 virus infection    Endometriosis    H/O: hysterectomy    IBS (irritable bowel syndrome)    Migraine, unspecified, without mention of intractable migraine without mention of status migrainosus    Migraines    Neuropathy    Shoulder pain      Past Surgical History: Past Surgical History:  Procedure Laterality Date   ABDOMINAL HYSTERECTOMY     CHOLECYSTECTOMY     CYSTOSCOPY  11/17/2011   Procedure: CYSTOSCOPY;  Surgeon: Norleen GORMAN Skill, MD;  Location: [facility];  Service: Gynecology;;   DIAGNOSTIC LAPAROSCOPY     LAPAROSCOPY  11/17/2011   Procedure: LAPAROSCOPY OPERATIVE;  Surgeon: Norleen GORMAN Skill, MD;  Location: [facility];  Service: Gynecology;  Laterality: N/A;  with Injection of Vaginal Cuff with Dexamethasone  mixed in 0.5% Bupivacaine    NASAL SEPTUM SURGERY     bone spur   right knee surgery Right 09/2022   Dr Jerri    Allergies: No Active Allergies  Outpatient Meds: Current Outpatient Medications  Medication Sig Dispense Refill   linaclotide  (LINZESS ) 145 MCG CAPS capsule Take 1 capsule (145 mcg total) by mouth daily before breakfast. 30 capsule 5   NURTEC 75 MG TBDP TAKE 1 TABLET BY MOUTH EVERY OTHER DAY 16 tablet 11   predniSONE  (STERAPRED UNI-PAK 21 TAB) 10 MG (21) TBPK tablet Take as directed 21 tablet 3   pseudoephedrine (SUDAFED)  120 MG 12 hr tablet Take 120 mg by mouth 2 (two) times daily.     rizatriptan  (MAXALT -MLT) 10 MG disintegrating tablet TAKE 1 TABLET (10 MG TOTAL) BY MOUTH AS NEEDED FOR MIGRAINE. MAY REPEAT IN 2 HOURS IF NEEDED. MAX 2 TABS IN 24 HRS. (TAKE ONLY 2-3 X WEEK). 16 tablet 2   ALPRAZolam  (XANAX ) 0.25 MG tablet Take 0.25 mg by mouth daily.     ALPRAZolam  (XANAX ) 0.5 MG tablet TAKE ONE TABLET DAILY AS NEEDED     loratadine (CLARITIN) 10 MG tablet Take 10 mg by mouth daily as needed for allergies.     Magnesium 300 MG CAPS Take 300 mg by mouth daily.     ondansetron  (ZOFRAN ) 4 MG tablet Take 1 tablet (4 mg total) by mouth as directed. Take one Zofran  4 mg tablet 30-60 minutes before each colonoscopy prep dose 4 tablet 0   rizatriptan  (MAXALT ) 10 MG tablet TAKE 1 TABLET BY MOUTH AS NEEDED FOR MIGRAINE. MAY REPEAT IN 2 HOURS IF NEEDED 16 tablet 11   tretinoin (RETIN-A) 0.025 % cream as needed. (Patient not taking: Reported on 04/12/2024)  3   Current Facility-Administered Medications  Medication Dose Route Frequency Provider Last Rate Last Admin   0.9 %  sodium chloride  infusion  500 mL Intravenous Once Danis, Mylin Gignac L III, MD          ___________________________________________________________________ Objective   Exam:  BP  127/81   Pulse 90   Temp (!) 97.3 F (36.3 C)   Ht 5' 8 (1.727 m)   Wt 166 lb 9.6 oz (75.6 kg)   SpO2 95%   BMI 25.33 kg/m   CV: regular , S1/S2 Resp: clear to auscultation bilaterally, normal RR and effort noted GI: soft, no tenderness, with active bowel sounds.   Assessment: Encounter Diagnosis  Name Primary?   Hx of colonic polyps Yes     Plan: Colonoscopy  The benefits and risks of the planned procedure(s) were described in detail with the patient or (when appropriate) their health care proxy.  Risks were outlined as including, but not limited to, bleeding, infection, perforation, adverse medication reaction leading to cardiac or pulmonary decompensation,  pancreatitis (if ERCP).  The limitation of incomplete mucosal visualization was also discussed.  No guarantees or warranties were given.  The patient is appropriate for an endoscopic procedure in the ambulatory setting.   - Victory Brand, MD

## 2024-04-12 NOTE — Op Note (Signed)
 [facility] Patient Name: [name] Procedure Date: 04/12/2024 10:50 AM MRN: [id number] Endoscopist: Victory L. Legrand , MD, [id number] Age: 58 Referring MD:  Date of Birth: 1965/12/13 Gender: Female Account #: [id number] Procedure:                Colonoscopy Indications:              Personal history of sessile serrated colon polyp                            (10 mm or greater in size)                           March 2022 -12 mm ascending colon SSP without                            dysplasia and additional diminutive polyp not                            retrieved Medicines:                Monitored Anesthesia Care Procedure:                Pre-Anesthesia Assessment:                           - Prior to the procedure, a History and Physical                            was performed, and patient medications and                            allergies were reviewed. The patient's tolerance of                            previous anesthesia was also reviewed. The risks                            and benefits of the procedure and the sedation                            options and risks were discussed with the patient.                            All questions were answered, and informed consent                            was obtained. Prior Anticoagulants: The patient has                            taken no anticoagulant or antiplatelet agents. ASA                            Grade Assessment: II - A patient with mild systemic  disease. After reviewing the risks and benefits,                            the patient was deemed in satisfactory condition to                            undergo the procedure.                           After obtaining informed consent, the colonoscope                            was passed under direct vision. Throughout the                            procedure, the patient's blood pressure, pulse, and                             oxygen saturations were monitored continuously. The                            Olympus Scope SN [id number] was introduced through the                            anus and advanced to the the cecum, identified by                            appendiceal orifice and ileocecal valve. The                            colonoscopy was performed without difficulty. The                            patient tolerated the procedure well. The quality                            of the bowel preparation was excellent with                            additional lavage. The ileocecal valve, appendiceal                            orifice, and rectum were photographed. The bowel                            preparation used was SUPREP. Scope In: 11:07:32 AM Scope Out: 11:23:32 AM Scope Withdrawal Time: 0 hours 11 minutes 14 seconds  Total Procedure Duration: 0 hours 16 minutes 0 seconds  Findings:                 Grade 3 prolapsed internal hemorrhoids were found                            on perianal exam.  Repeat examination of right colon under NBI                            performed.                           A diminutive polyp was found in the cecum. The                            polyp was mucous-capped and semi-sessile. The polyp                            was removed with a cold snare. Resection and                            retrieval were complete.                           Diverticula were found in the left colon.                           Internal hemorrhoids were found. The hemorrhoids                            were large.                           The exam was otherwise without abnormality on                            direct and retroflexion views. Complications:            No immediate complications. Estimated Blood Loss:     Estimated blood loss was minimal. Impression:               - Internal hemorrhoids found on perianal exam.                           - One diminutive  polyp in the cecum, removed with a                            cold snare. Resected and retrieved.                           - Diverticulosis in the left colon.                           - Internal hemorrhoids.                           - The examination was otherwise normal on direct                            and retroflexion views. Recommendation:           - Patient has a contact number available for  emergencies. The signs and symptoms of potential                            delayed complications were discussed with the                            patient. Return to normal activities tomorrow.                            Written discharge instructions were provided to the                            patient.                           - Resume previous diet.                           - Continue present medications.                           - Await pathology results.                           - Repeat colonoscopy is recommended for                            surveillance. The colonoscopy date will be                            determined after pathology results from today's                            exam become available for review. Amen Dargis L. Legrand, MD 04/12/2024 11:29:39 AM This report has been signed electronically.

## 2024-04-13 ENCOUNTER — Telehealth: Payer: Self-pay | Admitting: *Deleted

## 2024-04-13 ENCOUNTER — Other Ambulatory Visit: Payer: Self-pay

## 2024-04-13 DIAGNOSIS — G43009 Migraine without aura, not intractable, without status migrainosus: Secondary | ICD-10-CM

## 2024-04-13 MED ORDER — NURTEC 75 MG PO TBDP: 1.0000 | ORAL_TABLET | ORAL | 6 refills | Status: DC

## 2024-04-13 NOTE — Telephone Encounter (Signed)
 Last filled by patient on 01/30/24 Last office visit : 04/01/23 with Dr. Ines  PA APPROVED from 06/07/2023 to 06/06/2024 Next office visit : N/A Patient does not have an new assigned provider yet.

## 2024-04-13 NOTE — Telephone Encounter (Signed)
 Attempted f/u phone call. No answer. Left message.

## 2024-04-14 LAB — SURGICAL PATHOLOGY

## 2024-04-17 ENCOUNTER — Ambulatory Visit: Payer: Self-pay | Admitting: Gastroenterology

## 2024-05-01 ENCOUNTER — Encounter: Payer: Self-pay | Admitting: Radiology

## 2024-06-08 ENCOUNTER — Telehealth: Payer: Self-pay

## 2024-06-08 NOTE — Telephone Encounter (Signed)
 Pharmacy Patient Advocate Encounter   Received notification from CoverMyMeds that prior authorization for Nurtec is due for renewal.   Insurance verification completed.   The patient is insured through N[employer]A.  Action: Patient hasn't been seen in your office in over a year. Plan requires updated chart notes for PA renewal.

## 2024-06-14 ENCOUNTER — Telehealth: Payer: Self-pay | Admitting: Neurology

## 2024-06-14 NOTE — Telephone Encounter (Signed)
 Pt has scheduled her 1 yr f/u with wait list

## 2024-07-17 ENCOUNTER — Telehealth: Payer: Self-pay | Admitting: Neurology

## 2024-07-17 NOTE — Telephone Encounter (Signed)
 Mychart reschedule Ptrovider out

## 2024-07-25 ENCOUNTER — Ambulatory Visit: Admitting: Neurology

## 2024-07-25 ENCOUNTER — Telehealth: Payer: Self-pay | Admitting: Neurology

## 2024-07-25 ENCOUNTER — Encounter: Payer: Self-pay | Admitting: Neurology

## 2024-07-25 DIAGNOSIS — G43009 Migraine without aura, not intractable, without status migrainosus: Secondary | ICD-10-CM | POA: Diagnosis not present

## 2024-07-25 MED ORDER — NURTEC 75 MG PO TBDP: 1.0000 | ORAL_TABLET | ORAL | 5 refills | Status: AC

## 2024-07-25 MED ORDER — RIZATRIPTAN BENZOATE 10 MG PO TABS: 10.0000 mg | ORAL_TABLET | ORAL | 5 refills | Status: AC | PRN

## 2024-07-25 NOTE — Progress Notes (Signed)
 Subjective:    Patient ID: [name] is a 59 y.o. female.  HPI    True Mar, MD, PhD [facility] [address] [address] [place], [place] [address]  Ms. [name] is a 59 year old female with an underlying medical history of migraine headaches, history of brachial neuritis, endometriosis, irritable bowel syndrome, neuropathy, shoulder pain, anemia, s/p kidney donation, who presents for follow-up consultation of her chronic migraines.  The patient is unaccompanied today and presents after over 1 year.  She had seen Dr. Ines in this office and prior to that had been followed by Dr. Onita in this office, and she has also seen the nurse practitioner, Duwaine Russell in the past with.  She was last seen in our office by Dr. Ines in October 2024, at which time she was felt to be stable.  She was taking Nurtec and Maxalt  as needed.  She was not on a daily preventative.  She previously had tried several different medications including Aimovig  injections which she did not tolerate, Ubrelvy  which was not helpful, and Topamax  which she stopped at the time of her kidney donation.  I reviewed prior neurology records and copied some notes below for reference.  She originally started seeing Dr. Ines in this office in May 2019.  She saw Dr. Maurice in this office several years prior.  Today, 07/25/2024: She reports having had an increase in her migraines, she has been out of her Nurtec for over 2 months.  She took a round of prednisone  a couple of months ago but it did not really help.  She still has Maxalt  for as needed use.  She has generally taken the Nurtec as needed but has also used it for prevention.  She does not always take it every other day consistently.  She does need a refill for her Nurtec.  She generally had good results with her medication regimen.  She reports not sleeping well.  She does not wish to proceed with a sleep study at this time.  We talked  about the diagnostic procedure differences between a laboratory attended sleep study versus home sleep testing.  She would like to think about testing.  She does not believe she snores.  Her husband has not complained about it.  She has trouble falling asleep and this has been a chronic problem for a few years.  For the past year she has taken Unisom or similar over-the-counter medications every night.  She does not wake up rested.  She has nocturia about twice per average night and has frequently woken up with a headache either in the middle of the night or first thing in the morning.  She has a family history of migraines.  She drinks caffeine in the form of coffee, about 2 cups in the mornings.  She hydrates well with water , estimates that she drinks about 80 ounces of water  per day.  She does not drink any alcohol .  She is a non-smoker.  She may be due for her eye examination, last eye exam was about 1 or 2 years ago as per her recollection.  She does not have prescription eyeglasses but uses over-the-counter readers.  She denies any sudden onset or one-sided weakness or numbness or tingling or droopy face or slurring of speech. Over the course of years she has tried and failed multiple medications including Cymbalta , baclofen, Maxalt , Topamax , Ubrelvy , Aimovig , gabapentin , Zofran , and tizanidine .   Previously (copied from previous notes for reference):  04/01/2023 (Dr. Ines): <<Patient is very stable, she still has about 6 migraine days a month and less than 10 total headache days a month, she takes Nurtec and Maxalt  as needed, she does not feel at this time she needs a preventative, continue current management, we did talk about her options and at this time she prefers to continue current management.   Patient complains of symptoms per HPI as well as the following symptoms: none . Pertinent negatives and positives per HPI. All others negative   Addendum: Migraines improved to 6 migraine days a  month and < 10 total headache days a month. Can try Nurtec as prevention.    Interval history Oct 28, 2021: Patient was on Topiramate  100 mg at bedtime.  She weaned off due to giving her husband kidney.  From last appointment we discussed considering Ajovy  again but she did not think it helped.  Currently on Maxalt  and Nurtec. She missed her last appointment. Did not like Ubrelvy . Could not tolerate Aimovig .    She is having 8 migraine days a month > 15 total  total headache days a month.      Medications tried include: Gabapentin , topiramate  up to 200 mg daily, steroids, Cymbalta , diclofenac , Zofran , Mobic , blood pressure medications Spironolactone, tizanidine , Maxalt  and Nurtec, ajovy , aimovig , ubrelvy , nurtec, ajovy , aimovig (side effects)   Patient complains of symptoms per HPI as well as the following symptoms: migraine . Pertinent negatives and positives per HPI. All others negative    08/20/20: She is doing well on current therapy. She does not like the Ubrelvy . She prefers Nurtec, we will see if we can get that approved for her, keep her on the topiramate . Also maxalt  acutely. We discussed strategies for migraine acute management. She has 10 migraines a month, I offered to try and reduce that with other management or possibly trying ajovy  again (she didn't think it helped). But she is happy with this freq and feels she can treat the migraines when they happen.   Interval history 09/13/2019: She is smelling strong cigarette smoke, happens often, no hx of seizures, she has sinus problems and I recommended starting with ENT, it is associated with masks and adding moisture in the nose helps and she uses saline spray. We discussed Ajovy  again. We discussed Ajovy , nurtec. Discussed Frovatriptan and long-acting triptans. I can't refill her xanax , we had a talk about that.       Interval history 02/21/2019: Here for follow up of migraines. She tried the Aimovig  but she had constipation and could not  tolerate. She saw Dr. yan in the past and has been on multiple meds in the past. Worsening migraines. She is having most of the month if headaches and 10 days a month of migraines. She only took the Aimovig . Recommended Ajovy . Discussed oral medication.    HPI 11/11/2018:  [name] is a 59 y.o. female here as a referral from Dr. Tisovec for migraine. PMHX migraines, irritable bowel symptom, endometriosis, anxiety, anemia.  Patient is transitioning from 1 of my colleagues to me for migraine management.  Patient was previously seen by Dr. love.  Patient has a history of migraines for over 20 years.  She has an extensive family history of migraines.  She has been on multiple medications including Topamax  up to 200 mg a day.  Her headaches are normally located in the right side of the head, throbbing and pulsating, she has osmophobia and smells can trigger, she endorses nausea, so pounding that she  can't move, can be severe, also light sensitivity, no aura. 20 headache days a month, more than 10 are migrainous, can be severe and can last > 24 hours without treatment. No medication overuse.    Medications tried include: Gabapentin , topiramate  up to 200 mg daily, steroids, Cymbalta , diclofenac , Zofran , Mobic , blood pressure medications Spironolactone, tizanidine    Reviewed notes, labs and imaging from outside physicians, which showed:   Reviewed EMG study, data and results which was normal no electrodiagnostic evidence of large fiber peripheral neuropathy or right lumbosacral radiculopathy.   MRI of the brain images which I reviewed was normal November 2007.   Reviewed prior neurology notes: Headaches began in childhood since the age of 68.  Neuro is in the left side, and related to her menstrual cycle, and not associated with disturbances of nausea or vomiting or visual but does have a history of endometriosis.  Headaches last intermittently for 5 days and are improved with ketoprofen , Maxalt  therapy  and coffee.  Her daughter has been to the headache clinic and uses Biofreeze to the scalp as well as Maxalt .  Extensive family history of the patient's daughter, son, grandmother and mother have migraines.  Her migraines are throbbing, she has smell sensitivity and triggers and an ice pick quality.  She has been on multiple packs of steroids.  She has chronic neck pain.  IV Depacon have worked acutely for her migraines in the past  >>  03/23/2017 (Dr. Onita): <<40 year old right-handed white married female with a history of headaches followed by Dr. Maurice.   Her headache beginning in childhood since age 51, and changing in pattern since 2009. The headaches are always on the left side, unrelated to her menstrual cycle, and not associated with visual disturbance or nausea and vomiting. but has a history of endometriosis.    The headaches last intermitently for 5 days and are improved with Ketoprofen , Maxalt  therapy, and coffee. Her daughter has been to the headache clinic and uses bio freeze to the scalp as well as Maxalt . [name], son, grandmother, and mother have migraine.    The patient has tried food restrictions without success. She is on topirimate 50 mg in the morning and 100 mg at night without significant side effects. She has word finding difficulties and confusion that have not been serious.    She averages 3 migraine headache days per month. These are throbbing without associated nausea and vomiting made worse by smells such as Vanilla, and can have an ice pick quality. Maxalt  and ketoprofen  are effective almost 100% of the time.    She also has 4 headaches per week. which are not as severe  Her headaches can last 5 days in a row requiring steroids and currently she is on a tapering course of steroids for a one-month history of headaches.She has a two-year history of pain occurring in her shoulder blade region extending to her left arm along the inner aspect with discomfort in the  second, third ,fourth, and fifth fingers of  the left hand. There is no definite numbness but she describes tingling.    The  pain in the neck and symptoms in the  left arm are made worse by flexion of her neck. Her neck symptoms have improved since discontinuing exercise programs. She reports the onset of symptoms in her right leg with pain extending to the toes of her right foot on the outer aspect of her right foot for one year.    This began following  an exercise that she performed. Her daytime activities include going to the gym, cleaning the house and driving her children. She does not play golf or tennis She enjoys  spinning exercises with stationary bicycles.  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    MRI study of the brain without and with contrast 05/13/2006 which I reviewed  is normal.    Cervical spine x-rays 08/01/2010 which I reviewed are normal. MRI of the cervical spine 09/16/2010 shows  a shallow central  disc protrusion at C6-7 which is non compressive. The C7-T1 level is not as well seen. There is a  right lateral pharyngeal recess finding  that is nonspecific.    This was evaluated by Dr. 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 Lumbar MRI 09/29/2010  shows facet arthropathy at L2-3, L3-4, L4-5, and a  small annular rent eccentric to the left at L5-S1.She tolerates medications well without significant side effects.    She has irritable bowel syndrome gas, bloating followed by Dr. Obie and  currently treated with Xanax , hyoscyamine , and Align.   She underwent ovarian scar tissue surgery 11/17/11 with improvement in symptoms from her gynecologist She developed severe headaches that  were almost continuous 12/2010 and was evaluated in the office where she was treated with IV Depacon. CBC and CMP were unremarkable. She was given a tapering course of prednisone . She has received another prednisone  taper since she was last here by her primary care  physician. Since 8/18 when she went to the beach and sat in foldout chairs, she has pain in her right back, swelling in her right foot and lower leg, pain with walking, and numbness in the lateral anterior shin and side of her foot. It is made worse sitting and decreased standing. There is no change with bending over coughing or sneezing.    She gave up exercising and  restarted gabapentin  100 twice a day and 300 at night. She has had significant improvement since beginning Cymbalta  60 mg per day. She has not had side effects.   UPDATE June 3rd 2014.   She is now taking Cymbalta  90 each day, which helps her low back, leg pain better, she has 3-4 migraine headaches each month, Maxalt , ketoprofen  has been very helpful. She denies significant side effect.   Topamax  100mg  am, 100mg  pm.   Her migraine is fine, seasonal, with thw eather changes, zyetrec.   UPDATE August 28th 2015:  she continues to have migraines, 3-4 times each month, Topamax  100 mg twice a day works well, Maxalt  has been helpful, she wants refill her medications   UPDATE Sept 25 2018: She continue have migraine, about once every week, despite taking Topamax  100 mg twice a day as preventive medications, most recent migraine headache last 5 days, she has to take 2 tablets of Maxalt  each day, used 10 tablets of Maxalt  and baclofen in 5 days, she does have nausea, light sensitivity doing headaches,   She is on lower dose of Cymbalta  30 mg 2 tablets every morning now, she is active, playing tennis regularly, but often feel intermittent numbness tingling involving bilateral feet, bilateral hands,   >>  Her Past Medical History Is Significant For: Past Medical History:  Diagnosis Date   Anemia    Anxiety    Brachial neuritis or radiculitis NOS    COVID-19 virus infection    Endometriosis    H/O: hysterectomy    IBS (irritable bowel syndrome)    Migraine, unspecified,  without mention of intractable migraine without mention of  status migrainosus    Migraines    Neuropathy    Shoulder pain     Her Past Surgical History Is Significant For: Past Surgical History:  Procedure Laterality Date   ABDOMINAL HYSTERECTOMY     CHOLECYSTECTOMY     CYSTOSCOPY  11/17/2011   Procedure: CYSTOSCOPY;  Surgeon: Norleen GORMAN Skill, MD;  Location: [facility];  Service: Gynecology;;   DIAGNOSTIC LAPAROSCOPY     KIDNEY DONATION     04/2024   LAPAROSCOPY  11/17/2011   Procedure: LAPAROSCOPY OPERATIVE;  Surgeon: Norleen GORMAN Skill, MD;  Location: [facility];  Service: Gynecology;  Laterality: N/A;  with Injection of Vaginal Cuff with Dexamethasone  mixed in 0.5% Bupivacaine    NASAL SEPTUM SURGERY     bone spur   right knee surgery Right 09/2022   Dr Jerri    Her Family History Is Significant For: Family History  Problem Relation Age of Onset   Diabetes Mother        both sides grandparents   Hypertension Mother    Colon polyps Mother    Migraines Mother    Thyroid  disease Father    Colon polyps Father    Migraines Maternal Grandmother    Migraines Son    Migraines Daughter    Colon cancer Neg Hx    Stomach cancer Neg Hx    Esophageal cancer Neg Hx    Prostate cancer Neg Hx    Rectal cancer Neg Hx     Her Social History Is Significant For: Social History   Socioeconomic History   Marital status: Married    Spouse name: Acupuncturist   Number of children: 2   Years of education: Not on file   Highest education level: Associate degree: academic program  Occupational History   Occupation: stay at home  Tobacco Use   Smoking status: Former   Smokeless tobacco: Never  Advertising Account Planner   Vaping status: Never Used  Substance and Sexual Activity   Alcohol  use: Yes    Comment: social   Drug use: No   Sexual activity: Yes    Birth control/protection: Surgical  Other Topics Concern   Not on file  Social History Narrative   Patient lives at home with her husband Carson. Drinks two cups of caffeine daily. Patient has two years of college  education. Right handed.   Social Drivers of Health   Tobacco Use: Medium Risk (07/25/2024)   Patient History    Smoking Tobacco Use: Former    Smokeless Tobacco Use: Never    Passive Exposure: Not on file  Financial Resource Strain: Low Risk (05/23/2024)   Received from [facility]   Overall Financial Resource Strain (CARDIA)    How hard is it for you to pay for the very basics like food, housing, medical care, and heating?: Not hard at all  Food Insecurity: Low Risk (05/23/2024)   Received from [facility]   Epic    Within the past 12 months, you worried that your food would run out before you got money to buy more: Never true    Within the past 12 months, the food you bought just didn't last and you didn't have money to get more. : Never true  Transportation Needs: No Transportation Needs (05/23/2024)   Received from [facility]    In the past 12 months, has lack of reliable transportation kept you from medical appointments, meetings, work or from getting  things needed for daily living? : No  Physical Activity: Not on file  Stress: Not on file  Social Connections: Socially Isolated (05/23/2024)   Received from [facility]   Social Connection and Isolation Panel    In a typical week, how many times do you talk on the phone with family, friends, or neighbors?: Never    How often do you get together with friends or relatives?: Never    How often do you attend church or religious services?: Never    Do you belong to any clubs or organizations such as church groups, unions, fraternal or athletic groups, or school groups?: No    How often do you attend meetings of the clubs or organizations you belong to?: Never    Are you married, widowed, divorced, separated, never married, or living with a partner?: Married  Depression (PHQ2-9): Not on file  Alcohol  Screen: Not on file  Housing: Low Risk (05/23/2024)   Received from [facility]   Epic    What is your  living situation today?: I have a steady place to live    Think about the place you live. Do you have problems with any of the following? Choose all that apply:: None/None on this list  Utilities: Low Risk (05/23/2024)   Received from [facility]   Utilities    In the past 12 months has the electric, gas, oil, or water  company threatened to shut off services in your home? : No  Health Literacy: Not on file    Her Allergies Are:  Allergies[1]:   Her Current Medications Are:  Outpatient Encounter Medications as of 07/25/2024  Medication Sig   ALPRAZolam  (XANAX ) 0.5 MG tablet TAKE ONE TABLET DAILY AS NEEDED   linaclotide  (LINZESS ) 145 MCG CAPS capsule Take 1 capsule (145 mcg total) by mouth daily before breakfast.   loratadine (CLARITIN) 10 MG tablet Take 10 mg by mouth daily as needed for allergies.   Magnesium 300 MG CAPS Take 300 mg by mouth daily.   ondansetron  (ZOFRAN ) 4 MG tablet Take 1 tablet (4 mg total) by mouth as directed. Take one Zofran  4 mg tablet 30-60 minutes before each colonoscopy prep dose   predniSONE  (STERAPRED UNI-PAK 21 TAB) 10 MG (21) TBPK tablet Take as directed   pseudoephedrine (SUDAFED) 120 MG 12 hr tablet Take 120 mg by mouth 2 (two) times daily.   Rimegepant Sulfate (NURTEC) 75 MG TBDP Take 1 tablet (75 mg total) by mouth every other day.   rizatriptan  (MAXALT -MLT) 10 MG disintegrating tablet TAKE 1 TABLET (10 MG TOTAL) BY MOUTH AS NEEDED FOR MIGRAINE. MAY REPEAT IN 2 HOURS IF NEEDED. MAX 2 TABS IN 24 HRS. (TAKE ONLY 2-3 X WEEK).   ALPRAZolam  (XANAX ) 0.25 MG tablet Take 0.25 mg by mouth daily.   rizatriptan  (MAXALT ) 10 MG tablet TAKE 1 TABLET BY MOUTH AS NEEDED FOR MIGRAINE. MAY REPEAT IN 2 HOURS IF NEEDED   tretinoin (RETIN-A) 0.025 % cream as needed. (Patient not taking: Reported on 04/12/2024)   No facility-administered encounter medications on file as of 07/25/2024.  :   Review of Systems:  Out of a complete 14 point review of systems, all are reviewed  and negative with the exception of these symptoms as listed below:  Review of Systems  Objective:  Neurological Exam  Physical Exam Physical Examination:   Vitals:   07/25/24 1307  BP: 127/84  Pulse: 95    General Examination: The patient is a very pleasant 59 y.o. female  in no acute distress. She appears well-developed and well-nourished and well groomed.   HEENT: Normocephalic, atraumatic, pupils are equal, round and reactive to light, extraocular tracking is good without limitation to gaze excursion or nystagmus noted. No photophobia. No Corrective eye glasses in place. Hearing is grossly intact.  Face is symmetric with normal facial animation. Speech is clear without dysarthria. There is no hypophonia. There is no lip, neck/head, jaw or voice tremor. Neck is supple with full range of passive and active motion. There are no carotid bruits on auscultation.  Airway/Oropharynx exam reveals: mild mouth dryness, adequate dental hygiene and moderate airway crowding, due to thicker soft palate, tonsillar size about 1-2+ bilaterally, larger uvula noted.  Tongue protrudes centrally and palate elevates symmetrically.  Chest: Clear to auscultation without wheezing, rhonchi or crackles noted.  Heart: S1+S2+0, regular and normal without murmurs, rubs or gallops noted.   Abdomen: Soft, non-tender and non-distended.  Extremities: There is no pitting edema in the distal lower extremities bilaterally.   Skin: Warm and dry without trophic changes noted.   Musculoskeletal: exam reveals no obvious joint deformities.   Neurologically:  Mental status: The patient is awake, alert and oriented in all 4 spheres. Her immediate and remote memory, attention, language skills and fund of knowledge are appropriate. There is no evidence of aphasia, agnosia, apraxia or anomia. Speech is clear with normal prosody and enunciation. Thought process is linear. Mood is normal and affect is normal.  Cranial nerves II  - XII are as described above under HEENT exam.  Motor exam: Normal bulk, moving all 4 extremities without restriction, no obvious action or resting tremor.  No drift or rebound, no postural or intention tremor. Fine motor skills and coordination: Intact finger taps, hand movements and rapid alternating pattern with both upper extremities, normal foot taps bilaterally in the lower extremities.  Cerebellar testing: No dysmetria or intention tremor. There is no truncal or gait ataxia.  Normal finger-to-nose, normal heel-to-shin bilaterally.  Reflexes 2+ throughout, toes are downgoing bilaterally. Romberg negative. Sensory exam: intact to light touch in the upper and lower extremities.  Gait, station and balance: She stands easily. No veering to one side is noted. No leaning to one side is noted. Posture is age-appropriate and stance is narrow based. Gait shows normal stride length and normal pace. No problems turning are noted.   Assessment and Plan:   In summary, [name] is a 59 year old female with an underlying medical history of migraine headaches, history of brachial neuritis, endometriosis, irritable bowel syndrome, neuropathy, shoulder pain, anemia, s/p kidney donation, who presents for follow-up consultation of her chronic migraines of many years duration.  She has tried and failed multiple medications over the course of several years, currently on Maxalt  as needed and had been taking Nurtec for migraine prevention, sometimes only as needed.  We talked about the different ways of utilizing Nurtec today and she is agreeable to utilizing it every other day for migraine prevention.  She has seen an increase in her migraines after she has been off of Nurtec for over 2 months.  We talked about migraine triggers and alleviating factors and contributing conditions including sleep disordered breathing, strain on the eyes.  I had a long discussion today.  This was an extended visit of over 45  minutes with copious record review involved in considerable counseling and coordination of care. Below is a summary of my recommendations and our discussion points from today's visit, based on chart review, history and  examination. They were given these instructions verbally during the visit in detail and also in writing in the MyChart after visit summary (AVS), which they can access electronically. << Please remember, common headache triggers are: sleep deprivation, dehydration, overheating, stress, hypoglycemia or skipping meals and blood sugar fluctuations, excessive pain medications or excessive alcohol  use or caffeine withdrawal. Some people have food triggers such as aged cheese, orange juice or chocolate, especially dark chocolate, or MSG (monosodium glutamate). Try to avoid these headache triggers as much possible. It may be helpful to keep a headache diary to figure out what makes your headaches worse or brings them on and what alleviates them. Some people report headache onset after exercise but studies have shown that regular exercise may actually prevent headaches from coming. If you have exercise-induced headaches, please make sure that you drink plenty of fluid before and after exercising and that you do not over do it and do not overheat. Please continue to hydrate well with water  and limit your caffeine as you are. Continue with Nurtec every other day for migraine prevention and Maxalt  as needed for acute migraines, avoid taking Maxalt  more than 3 times a week, you cannot take more than 2 pills within 24 hours.   l recommend we proceed with a sleep study or home sleep test to evaluate you for possible obstructive sleep apnea as you have woken up with headaches and you do not sleep well.  Please call our office or email us  through MyChart if you change your mind regarding sleep testing.   Please get an updated full, dilated eye examination.  Strain on the eyes can also perpetuate headaches or  cause new headaches or worsening migraines.   We will plan a follow up in this clinic in about 1 year with the nurse practitioner, sooner if needed.    >>    I renewed her prescriptions for Nurtec 75 mg every other day for migraine prevention and Maxalt  10 mg pills as needed.  She also took Maxalt  MLT more recently since the tablets were not available at her pharmacy but she would prefer the tablets.   I answered all her questions today and she was in agreement with our plan as discussed above.     [1] No Known Allergies

## 2024-07-25 NOTE — Patient Instructions (Addendum)
" °  Here is what we discussed today and my recommendations for you:   Please remember, common headache triggers are: sleep deprivation, dehydration, overheating, stress, hypoglycemia or skipping meals and blood sugar fluctuations, excessive pain medications or excessive alcohol  use or caffeine withdrawal. Some people have food triggers such as aged cheese, orange juice or chocolate, especially dark chocolate, or MSG (monosodium glutamate). Try to avoid these headache triggers as much possible. It may be helpful to keep a headache diary to figure out what makes your headaches worse or brings them on and what alleviates them. Some people report headache onset after exercise but studies have shown that regular exercise may actually prevent headaches from coming. If you have exercise-induced headaches, please make sure that you drink plenty of fluid before and after exercising and that you do not over do it and do not overheat. Please continue to hydrate well with water  and limit your caffeine as you are. Continue with Nurtec every other day for migraine prevention and Maxalt  as needed for acute migraines, avoid taking Maxalt  more than 3 times a week, you cannot take more than 2 pills within 24 hours.   l recommend we proceed with a sleep study or home sleep test to evaluate you for possible obstructive sleep apnea as you have woken up with headaches and you do not sleep well.  Please call our office or email us  through MyChart if you change your mind regarding sleep testing.   Please get an updated full, dilated eye examination.  Strain on the eyes can also perpetuate headaches or cause new headaches or worsening migraines.   We will plan a follow up in this clinic in about 1 year with the nurse practitioner, sooner if needed.     "

## 2024-07-25 NOTE — Telephone Encounter (Signed)
 LVM and mychart informing pt to resched her 1/27 due to weather

## 2024-07-25 NOTE — Telephone Encounter (Signed)
 LVM and sent mychart informing pt reschedule needed due to weather

## 2024-07-26 ENCOUNTER — Telehealth: Payer: Self-pay | Admitting: Gastroenterology

## 2024-07-26 MED ORDER — LUBIPROSTONE 8 MCG PO CAPS: 8.0000 ug | ORAL_CAPSULE | Freq: Two times a day (BID) | ORAL | 0 refills | Status: AC

## 2024-07-26 NOTE — Telephone Encounter (Signed)
 Dr Legrand I spoke with [name] and she is very upset with the Linzess  cost esp since it works well for her. She tried [facility] and it is still over $300. I told her about assistance programs and she said she wouldn't qualify. I ask her to call her insurance company. She said she had just spent an hour on the phone with them and they couldn't tell her why the cost was so high since her plan hasn't changed. She is requesting you please come up with an alternative for her to try.

## 2024-07-26 NOTE — Telephone Encounter (Signed)
 I spoke with [name] and she wants to try the Amitiza . Confirmed patient's pharmacy and sent it in.

## 2024-07-26 NOTE — Telephone Encounter (Signed)
 Sorry to hear that, I understand it is frustrating to deal with the insurance company's opaque process regarding drug pricing.  Unfortunately, I have no way of knowing which, if any, alternate medicines will be more cost effective under her plan. Knowing preferred or lower tier medicines from her insurance might point us  in the right direction.  We could try Amitiza  8 micrograms twice daily.   Please send a Rx if she would like.  - H. Legrand, MD

## 2024-07-26 NOTE — Telephone Encounter (Signed)
 Incoming call from pt regarding Rx refill Linzess . Pt stated the price has went up to 300 dollars. Pt requesting possible alternatives . Please advise. Thank you

## 2024-07-27 ENCOUNTER — Encounter: Payer: Self-pay | Admitting: Neurology

## 2024-07-27 ENCOUNTER — Encounter: Payer: Self-pay | Admitting: Gastroenterology

## 2024-07-31 ENCOUNTER — Telehealth: Payer: Self-pay

## 2024-07-31 ENCOUNTER — Other Ambulatory Visit (HOSPITAL_COMMUNITY): Payer: Self-pay

## 2024-07-31 NOTE — Telephone Encounter (Signed)
 Pharmacy Patient Advocate Encounter   Received notification from Patient Advice Request messages that prior authorization for Lubiprostone  capsules  is required/requested.   Insurance verification completed.   The patient is insured through [facility].   Per test claim: Patient has not been seen in office for over a year. Updated office visit documentation is required for authorization submission.

## 2024-07-31 NOTE — Telephone Encounter (Signed)
 I called pt's pharmacy. I was told pt has actually only been receiving 6 tablets of Rizatriptan . Likely an insurance restriction. Pharmacy will allow pt to use a good rx coupon to pay for remaining tablets in the prescription.

## 2024-08-03 ENCOUNTER — Ambulatory Visit: Payer: Self-pay | Admitting: Neurology

## 2024-08-23 ENCOUNTER — Ambulatory Visit: Admitting: Neurology

## 2025-07-25 ENCOUNTER — Ambulatory Visit: Admitting: Neurology
